# Patient Record
Sex: Male | Born: 1985 | Race: Black or African American | Hispanic: No | Marital: Single | Smoking: Former smoker
Health system: Southern US, Community
[De-identification: ages and names within clinical notes are randomized; demographics above are authoritative.]

## PROBLEM LIST (undated history)

## (undated) DIAGNOSIS — T7840XA Allergy, unspecified, initial encounter: Secondary | ICD-10-CM

## (undated) DIAGNOSIS — I10 Essential (primary) hypertension: Secondary | ICD-10-CM

## (undated) DIAGNOSIS — J45909 Unspecified asthma, uncomplicated: Secondary | ICD-10-CM

## (undated) HISTORY — DX: Essential (primary) hypertension: I10

## (undated) HISTORY — DX: Unspecified asthma, uncomplicated: J45.909

## (undated) HISTORY — DX: Allergy, unspecified, initial encounter: T78.40XA

---

## 2007-04-18 ENCOUNTER — Emergency Department (HOSPITAL_COMMUNITY): Admission: EM | Admit: 2007-04-18 | Discharge: 2007-04-18 | Payer: Self-pay | Admitting: Family Medicine

## 2007-05-18 ENCOUNTER — Emergency Department (HOSPITAL_COMMUNITY): Admission: EM | Admit: 2007-05-18 | Discharge: 2007-05-18 | Payer: Self-pay | Admitting: Family Medicine

## 2009-03-17 ENCOUNTER — Emergency Department (HOSPITAL_COMMUNITY): Admission: EM | Admit: 2009-03-17 | Discharge: 2009-03-17 | Payer: Self-pay | Admitting: Emergency Medicine

## 2009-04-20 ENCOUNTER — Emergency Department (HOSPITAL_COMMUNITY): Admission: EM | Admit: 2009-04-20 | Discharge: 2009-04-21 | Payer: Self-pay | Admitting: Emergency Medicine

## 2009-08-03 ENCOUNTER — Encounter (INDEPENDENT_AMBULATORY_CARE_PROVIDER_SITE_OTHER): Payer: Self-pay | Admitting: Family Medicine

## 2009-08-03 ENCOUNTER — Observation Stay (HOSPITAL_COMMUNITY): Admission: EM | Admit: 2009-08-03 | Discharge: 2009-08-04 | Payer: Self-pay | Admitting: Emergency Medicine

## 2009-09-14 ENCOUNTER — Encounter (INDEPENDENT_AMBULATORY_CARE_PROVIDER_SITE_OTHER): Payer: Self-pay | Admitting: Family Medicine

## 2009-09-21 ENCOUNTER — Ambulatory Visit: Payer: Self-pay | Admitting: Family Medicine

## 2009-09-21 DIAGNOSIS — J309 Allergic rhinitis, unspecified: Secondary | ICD-10-CM | POA: Insufficient documentation

## 2009-09-21 DIAGNOSIS — R519 Headache, unspecified: Secondary | ICD-10-CM | POA: Insufficient documentation

## 2009-09-21 DIAGNOSIS — J4530 Mild persistent asthma, uncomplicated: Secondary | ICD-10-CM | POA: Insufficient documentation

## 2009-09-21 DIAGNOSIS — R51 Headache: Secondary | ICD-10-CM | POA: Insufficient documentation

## 2009-09-21 LAB — CONVERTED CEMR LAB
ALT: 12 units/L (ref 0–53)
AST: 14 units/L (ref 0–37)
Basophils Absolute: 0 10*3/uL (ref 0.0–0.1)
Basophils Relative: 1 % (ref 0–1)
Creatinine, Ser: 0.86 mg/dL (ref 0.40–1.50)
Eosinophils Absolute: 0.2 10*3/uL (ref 0.0–0.7)
MCHC: 32.5 g/dL (ref 30.0–36.0)
MCV: 87.1 fL (ref 78.0–100.0)
Neutrophils Relative %: 49 % (ref 43–77)
Platelets: 224 10*3/uL (ref 150–400)
Total Bilirubin: 0.7 mg/dL (ref 0.3–1.2)
Vit D, 25-Hydroxy: 13 ng/mL — ABNORMAL LOW (ref 30–89)

## 2009-09-25 ENCOUNTER — Encounter (INDEPENDENT_AMBULATORY_CARE_PROVIDER_SITE_OTHER): Payer: Self-pay | Admitting: Family Medicine

## 2009-10-16 ENCOUNTER — Telehealth (INDEPENDENT_AMBULATORY_CARE_PROVIDER_SITE_OTHER): Payer: Self-pay | Admitting: Family Medicine

## 2009-10-17 ENCOUNTER — Encounter (INDEPENDENT_AMBULATORY_CARE_PROVIDER_SITE_OTHER): Payer: Self-pay | Admitting: Family Medicine

## 2009-11-21 ENCOUNTER — Encounter: Payer: Self-pay | Admitting: Physician Assistant

## 2009-11-21 ENCOUNTER — Ambulatory Visit: Payer: Self-pay | Admitting: Family Medicine

## 2009-11-21 DIAGNOSIS — E559 Vitamin D deficiency, unspecified: Secondary | ICD-10-CM | POA: Insufficient documentation

## 2009-11-21 LAB — CONVERTED CEMR LAB
HDL: 47 mg/dL (ref >39)
LDL Cholesterol: 81 mg/dL (ref 0–99)
Total CHOL/HDL Ratio: 3

## 2010-01-18 ENCOUNTER — Ambulatory Visit: Payer: Self-pay | Admitting: Physician Assistant

## 2010-01-18 DIAGNOSIS — L851 Acquired keratosis [keratoderma] palmaris et plantaris: Secondary | ICD-10-CM | POA: Insufficient documentation

## 2010-04-06 ENCOUNTER — Ambulatory Visit: Payer: Self-pay | Admitting: Physician Assistant

## 2010-04-12 ENCOUNTER — Encounter (INDEPENDENT_AMBULATORY_CARE_PROVIDER_SITE_OTHER): Payer: Self-pay | Admitting: *Deleted

## 2010-04-18 LAB — CONVERTED CEMR LAB: Vit D, 25-Hydroxy: 23 ng/mL — ABNORMAL LOW (ref 30–89)

## 2010-06-01 ENCOUNTER — Ambulatory Visit: Payer: Self-pay | Admitting: Physician Assistant

## 2010-06-01 DIAGNOSIS — B356 Tinea cruris: Secondary | ICD-10-CM | POA: Insufficient documentation

## 2010-06-05 ENCOUNTER — Telehealth: Payer: Self-pay | Admitting: Physician Assistant

## 2010-06-29 ENCOUNTER — Ambulatory Visit: Payer: Self-pay | Admitting: Physician Assistant

## 2010-07-01 ENCOUNTER — Telehealth: Payer: Self-pay | Admitting: Physician Assistant

## 2010-08-17 ENCOUNTER — Ambulatory Visit: Payer: Self-pay | Admitting: Physician Assistant

## 2010-09-13 ENCOUNTER — Telehealth (INDEPENDENT_AMBULATORY_CARE_PROVIDER_SITE_OTHER): Payer: Self-pay | Admitting: Nurse Practitioner

## 2010-11-16 ENCOUNTER — Encounter (INDEPENDENT_AMBULATORY_CARE_PROVIDER_SITE_OTHER): Payer: Self-pay | Admitting: Nurse Practitioner

## 2010-11-16 ENCOUNTER — Ambulatory Visit
Admission: RE | Admit: 2010-11-16 | Discharge: 2010-11-16 | Payer: Self-pay | Source: Home / Self Care | Attending: Physician Assistant | Admitting: Physician Assistant

## 2010-11-16 DIAGNOSIS — I10 Essential (primary) hypertension: Secondary | ICD-10-CM | POA: Insufficient documentation

## 2010-11-16 LAB — CONVERTED CEMR LAB
ALT: 15 units/L (ref 0–53)
AST: 15 units/L (ref 0–37)
Alkaline Phosphatase: 46 units/L (ref 39–117)
Basophils Absolute: 0 10*3/uL (ref 0.0–0.1)
Basophils Relative: 0 % (ref 0–1)
Chloride: 107 meq/L (ref 96–112)
Creatinine, Ser: 0.85 mg/dL (ref 0.40–1.50)
Eosinophils Absolute: 0.3 10*3/uL (ref 0.0–0.7)
Hemoglobin: 14 g/dL (ref 13.0–17.0)
LDL Cholesterol: 109 mg/dL — ABNORMAL HIGH (ref 0–99)
MCHC: 31.6 g/dL (ref 30.0–36.0)
Monocytes Absolute: 0.9 10*3/uL (ref 0.1–1.0)
Neutro Abs: 2.1 10*3/uL (ref 1.7–7.7)
RDW: 13.4 % (ref 11.5–15.5)
TSH: 0.732 microintl units/mL (ref 0.350–4.500)
Total Bilirubin: 0.5 mg/dL (ref 0.3–1.2)
Total CHOL/HDL Ratio: 4.2
VLDL: 14 mg/dL (ref 0–40)

## 2010-11-19 ENCOUNTER — Encounter (INDEPENDENT_AMBULATORY_CARE_PROVIDER_SITE_OTHER): Payer: Self-pay | Admitting: Nurse Practitioner

## 2010-12-05 NOTE — Assessment & Plan Note (Signed)
Summary: 2 MONTH FU ON ASTHMA/////KT   Vital Signs:  Patient profile:   25 year old male Height:      67 inches Weight:      236 pounds BMI:     37.10 Temp:     98.5 degrees F oral Pulse rate:   51 / minute Pulse rhythm:   regular Resp:     18 per minute BP sitting:   136 / 75  (left arm) Cuff size:   large  Vitals Entered By: Armenia Shannon (January 18, 2010 3:36 PM) CC: f/u on asthma...Marland KitchenMarland Kitchen pt is concerned about his dry skin patches..... Is Patient Diabetic? No Pain Assessment Patient in pain? no       Does patient need assistance? Functional Status Self care Ambulation Normal Comments pf 1.  360      2. 430            3. 410   Primary Care Provider:  Tereso Newcomer, PA-C  CC:  f/u on asthma...Marland KitchenMarland Kitchen pt is concerned about his dry skin patches......  History of Present Illness: Here for f/u. Breathing is good.  Not using ventolin as much.  Now using maybe 2 x a week.  Does use before exercising as precaution.  Has been running more lately.  No significant dyspnea. Has dry patches on his arms.  Not pruritic.  Showers daily.  No hives.    Asthma History    Asthma Control Assessment:    Age range: 12+ years    Symptoms: 0-2 days/week    Nighttime Awakenings: 0-2/month    Interferes w/ normal activity: no limitations    SABA use (not for EIB): 0-2 days/week    Asthma Control Assessment: Well Controlled   Current Medications (verified): 1)  Flovent Hfa 110 Mcg/act Aero (Fluticasone Propionate  Hfa) .... 3 Puffs Two Times A Day 2)  Prednisone 10 Mg Tabs (Prednisone) .... As Needed 3)  Ventolin Hfa 108 (90 Base) Mcg/act Aers (Albuterol Sulfate) .... 2 Puffs With Aerochamber Q 4-6 Hrs As Needed 4)  Ergocalciferol 50000 Unit Caps (Ergocalciferol) .... Take One By Mouth Per Week For 12 Weeks.  Allergies (verified): No Known Drug Allergies  Physical Exam  General:  alert, well-developed, well-nourished, and well-hydrated.   Head:  normocephalic and atraumatic.   Neck:   supple.   Lungs:  normal breath sounds, no crackles, and no wheezes.   Heart:  normal rate and regular rhythm.   Neurologic:  alert & oriented X3 and cranial nerves II-XII intact.   Skin:  dry, scaly patches noted scattered about upper arms and torso   Impression & Recommendations:  Problem # 1:  ASTHMA (ICD-493.90) controlled continue current meds refill ventolin f/u in 4 mos  His updated medication list for this problem includes:    Flovent Hfa 110 Mcg/act Aero (Fluticasone propionate  hfa) .Marland KitchenMarland KitchenMarland KitchenMarland Kitchen 3 puffs two times a day    Prednisone 10 Mg Tabs (Prednisone) .Marland Kitchen... As needed    Ventolin Hfa 108 (90 Base) Mcg/act Aers (Albuterol sulfate) .Marland Kitchen... 2 puffs with aerochamber q 4-6 hrs as needed  Problem # 2:  VITAMIN D DEFICIENCY (ICD-268.9) check labs in April  Problem # 3:  DRY SKIN (ICD-701.1) cut back on soap use moisturizer f/u as needed  Complete Medication List: 1)  Flovent Hfa 110 Mcg/act Aero (Fluticasone propionate  hfa) .... 3 puffs two times a day 2)  Prednisone 10 Mg Tabs (Prednisone) .... As needed 3)  Ventolin Hfa 108 (90 Base) Mcg/act Aers (  Albuterol sulfate) .... 2 puffs with aerochamber q 4-6 hrs as needed 4)  Ergocalciferol 50000 Unit Caps (Ergocalciferol) .... Take one by mouth per week for 12 weeks.  Patient Instructions: 1)  Please schedule a follow-up appointment in 4 months with Domani Bakos for Asthma.  2)  Try to use soap on entire body just once a week. 3)  Use Eucerin or Aveeno after you bathe to help moisturize your skin. Prescriptions: VENTOLIN HFA 108 (90 BASE) MCG/ACT AERS (ALBUTEROL SULFATE) 2 puffs with aerochamber q 4-6 hrs as needed  #1 x 11   Entered and Authorized by:   Tereso Newcomer PA-C   Signed by:   Tereso Newcomer PA-C on 01/18/2010   Method used:   Faxed to ...       [facility] (retail)       [address] [place], [place]  [address]       Ph: [phone]       Fax: [phone]   RxID:    [id number]   Prevention & Chronic Care Immunizations   Influenza vaccine: Not documented    Tetanus booster: Not documented    Pneumococcal vaccine: Not documented  Other Screening   Smoking status: never  (09/21/2009)

## 2010-12-05 NOTE — Letter (Signed)
Summary: PT INFORMATION SHEET  PT INFORMATION SHEET   Imported By: Arta Bruce 11/06/2009 12:16:58  _____________________________________________________________________  External Attachment:    Type:   Image     Comment:   External Document

## 2010-12-05 NOTE — Assessment & Plan Note (Signed)
Summary: Asthma   Vital Signs:  Patient profile:   25 year old male Height:      67 inches Weight:      248 pounds BMI:     38.98 O2 Sat:      97 % on Room air Temp:     98.3 degrees F oral Pulse rate:   66 / minute Pulse rhythm:   regular Resp:     20 per minute BP sitting:   110 / 73  (left arm) Cuff size:   regular  Vitals Entered By: Hassell Halim CMA (August 17, 2010 9:33 AM)  O2 Flow:  Room air CC: F/U... Is Patient Diabetic? No Pain Assessment Patient in pain? no       Does patient need assistance? Functional Status Self care Ambulation Normal Comments PF  1.  450    2.   340     3.   440   Primary Care Provider:  Tereso Newcomer, PA-C  CC:  F/U....  History of Present Illness: Here for f/u on Asthma.  Doing better.  No side effects to Advair.   Asthma History    Asthma Control Assessment:    Age range: 12+ years    Symptoms: 0-2 days/week    Nighttime Awakenings: 0-2/month    Interferes w/ normal activity: no limitations    SABA use (not for EIB): 0-2 days/week    Asthma Control Assessment: Well Controlled   Current Medications (verified): 1)  Advair Diskus 250-50 Mcg/dose Aepb (Fluticasone-Salmeterol) .Marland Kitchen.. 1 Inhalation Two Times A Day 2)  Prednisone 10 Mg Tabs (Prednisone) .... As Needed 3)  Ventolin Hfa 108 (90 Base) Mcg/act Aers (Albuterol Sulfate) .... 2 Puffs With Aerochamber Q 4-6 Hrs As Needed 4)  Vitamin D 400 Unit Tabs (Cholecalciferol) .... Take 2 By Mouth Once Daily 5)  Lamisil At 1 % Crea (Terbinafine Hcl) .... Apply To Affected Area Two Times A Day Until Clear  Allergies (verified): No Known Drug Allergies  Physical Exam  General:  alert, well-developed, and well-nourished.   Head:  normocephalic and atraumatic.   Eyes:  pupils equal, pupils round, and pupils reactive to light.   Ears:  R ear normal and L ear normal.   Mouth:  pharynx pink and moist and no exudates.   Neck:  supple.   Lungs:  Good breath sounds bilat rare left  basilar wheeze, clears o/w no wheezing  no rales  Heart:  normal rate and regular rhythm.   Neurologic:  alert & oriented X3.   Psych:  normally interactive.     Impression & Recommendations:  Problem # 1:  ASTHMA (ICD-493.90) Assessment Improved continue current mgmt will give claritin to fill if symptoms of allergies start (rarely has)  His updated medication list for this problem includes:    Advair Diskus 250-50 Mcg/dose Aepb (Fluticasone-salmeterol) .Marland Kitchen... 1 inhalation two times a day    Prednisone 10 Mg Tabs (Prednisone) .Marland Kitchen... As needed    Ventolin Hfa 108 (90 Base) Mcg/act Aers (Albuterol sulfate) .Marland Kitchen... 2 puffs with aerochamber q 4-6 hrs as needed  Complete Medication List: 1)  Advair Diskus 250-50 Mcg/dose Aepb (Fluticasone-salmeterol) .Marland Kitchen.. 1 inhalation two times a day 2)  Prednisone 10 Mg Tabs (Prednisone) .... As needed 3)  Ventolin Hfa 108 (90 Base) Mcg/act Aers (Albuterol sulfate) .... 2 puffs with aerochamber q 4-6 hrs as needed 4)  Vitamin D 400 Unit Tabs (Cholecalciferol) .... Take 2 by mouth once daily 5)  Lamisil At 1 %  Crea (Terbinafine hcl) .... Apply to affected area two times a day until clear 6)  Loratadine 10 Mg Tabs (Loratadine) .... Take 1 tablet by mouth once a day as needed for allergy symptoms  Other Orders: Flu Vaccine 28yrs + ([address]) Admin 1st Vaccine (62130)  Patient Instructions: 1)  Please schedule a follow-up appointment in 3 months for asthma.  Prescriptions: LORATADINE 10 MG TABS (LORATADINE) Take 1 tablet by mouth once a day as needed for allergy symptoms  #30 x 3   Entered and Authorized by:   Tereso Newcomer PA-C   Signed by:   Tereso Newcomer PA-C on 08/17/2010   Method used:   Print then Give to Patient   RxID:   [id number]    Immunizations Administered:  Influenza Vaccine # 1:    Vaccine Type: Fluvax 3+    Site: right deltoid    Mfr: GlaxoSmithKline    Dose: 0.5 ml    Route: IM    Given by: Dutch Quint RN    Exp. Date:  05/04/2011    Lot #: [id number]    VIS given: 05/29/10 version given August 17, 2010.  Flu Vaccine Consent Questions:    Do you have a history of severe allergic reactions to this vaccine? no    Any prior history of allergic reactions to egg and/or gelatin? no    Do you have a sensitivity to the preservative Thimersol? no    Do you have a past history of Guillan-Barre Syndrome? no    Do you currently have an acute febrile illness? no    Have you ever had a severe reaction to latex? no    Vaccine information given and explained to patient? yes

## 2010-12-05 NOTE — Progress Notes (Signed)
Summary: RASH ON RIGHT FOOT  Phone Note Call from Patient Call back at [phone]- [phone]   Reason for Call: Talk to Nurse Summary of Call: [name] SAYS HE HAS PATCH OF DRY SKIN ON HIS RIGHT FOOT,BUT HE'S NOT SURE ITS DRY SKIN, BUT IT HURTS AND HE THINKS ITS CAUSING HIS FOOT TO GO TO SLEEP. YOU CAN CALL HIM @ WORK Initial call taken by: Luke Gross,  September 13, 2010 8:40 AM  Follow-up for Phone Call        Has had for awhile, just forgot to mention it at last visit.  Is still causing him pain.  Triage visit for 09/18/10.  Luke Quint RN  September 17, 2010 4:31 PM  No show for appointment.  Luke Quint RN  September 18, 2010 2:51 PM

## 2010-12-05 NOTE — Letter (Signed)
Summary: Discharge Summary  Discharge Summary   Imported By: Arta Bruce 11/07/2009 14:15:37  _____________________________________________________________________  External Attachment:    Type:   Image     Comment:   External Document

## 2010-12-05 NOTE — Letter (Signed)
Summary: Generic Letter  [facility]  [address] [place], [place] [address]   Phone: [phone]  Fax: [phone]    04/12/2010  BREWER HITCHMAN [address] [place], [place]  [address]  Dear Mr. Gombert,  We have been unable to contact you by telephone.  Please call our office, at your convenience, so that we may speak with you.   Sincerely,   Dutch Quint RN

## 2010-12-05 NOTE — Assessment & Plan Note (Signed)
Summary: FU APP IN 3-4 WKS WITH SCOTT TO CHECK BREATHING//GK   Vital Signs:  Patient profile:   25 year old male Height:      67 inches Weight:      235 pounds BMI:     36.94 O2 Sat:      98 % on Room air Temp:     97.2 degrees F oral Pulse rate:   56 / minute Pulse rhythm:   regular Resp:     18 per minute BP sitting:   129 / 74  (left arm) Cuff size:   large  Vitals Entered By: Armenia Shannon (June 29, 2010 8:53 AM)  O2 Flow:  Room air CC: f/u.. Is Patient Diabetic? No Pain Assessment Patient in pain? no       Does patient need assistance? Functional Status Self care Ambulation Normal Comments pf  1.   390    2.   460     3.   470   Primary Care Provider:  Tereso Newcomer, PA-C  CC:  f/u...  History of Present Illness: Here for f/u on asthma.  Notes symptoms about the same. See below.  Patient uses albuterol prophylactically throughout the day.  He has gotten used to doing this over the years.  Does not think he uses albuterol that much during the day b/c he needs it.  Asthma History    Asthma Control Assessment:    Age range: 12+ years    Symptoms: >2 days/week    Nighttime Awakenings: 0-2/month    Interferes w/ normal activity: no limitations    SABA use (not for EIB): >2 days/week    Asthma Control Assessment: Not Well Controlled   Current Medications (verified): 1)  Flovent Hfa 110 Mcg/act Aero (Fluticasone Propionate  Hfa) .... 3 Puffs Two Times A Day 2)  Prednisone 10 Mg Tabs (Prednisone) .... As Needed 3)  Ventolin Hfa 108 (90 Base) Mcg/act Aers (Albuterol Sulfate) .... 2 Puffs With Aerochamber Q 4-6 Hrs As Needed 4)  Ergocalciferol 50000 Unit Caps (Ergocalciferol) .... Take One By Mouth Per Week For 8 Weeks. 5)  Lamisil At 1 % Crea (Terbinafine Hcl) .... Apply To Affected Area Two Times A Day Until Clear  Allergies (verified): No Known Drug Allergies  Physical Exam  General:  alert, well-developed, and well-nourished.   Head:  normocephalic and  atraumatic.   Neck:  supple and no cervical lymphadenopathy.   Lungs:  improved air movement from last exam faint exp wheezes at apices Heart:  RRR no murmur  Extremities:  no edema  Neurologic:  alert & oriented X3 and cranial nerves II-XII intact.   Psych:  normally interactive.     Impression & Recommendations:  Problem # 1:  ASTHMA (ICD-493.90) Assessment Improved control still not where I think it should be with him using albuterol throughout the day without waiting for symptoms, I'm not sure if he is having a lot of bronchospasm throughout the day or not change flovent to Advair f/u with me in 6 weeks samples x 2 given  His updated medication list for this problem includes:    Advair Diskus 250-50 Mcg/dose Aepb (Fluticasone-salmeterol) .Marland Kitchen... 1 inhalation two times a day    Prednisone 10 Mg Tabs (Prednisone) .Marland Kitchen... As needed    Ventolin Hfa 108 (90 Base) Mcg/act Aers (Albuterol sulfate) .Marland Kitchen... 2 puffs with aerochamber q 4-6 hrs as needed  Problem # 2:  VITAMIN D DEFICIENCY (ICD-268.9)  Orders: T-Vitamin D (25-Hydroxy) (81191-47829)  Complete  Medication List: 1)  Advair Diskus 250-50 Mcg/dose Aepb (Fluticasone-salmeterol) .Marland Kitchen.. 1 inhalation two times a day 2)  Prednisone 10 Mg Tabs (Prednisone) .... As needed 3)  Ventolin Hfa 108 (90 Base) Mcg/act Aers (Albuterol sulfate) .... 2 puffs with aerochamber q 4-6 hrs as needed 4)  Ergocalciferol 50000 Unit Caps (Ergocalciferol) .... Take one by mouth per week for 8 weeks. 5)  Lamisil At 1 % Crea (Terbinafine hcl) .... Apply to affected area two times a day until clear  Asthma Management Plan    Asthma Severity: Moderate Persistent    Control Assessment: Not Well Controlled    Personal best PEF: 580 liters/minute    Predicted PEF: 602 liters/minute    Working PEF: 580 liters/minute    Plan based on PEF formula: [place] [employer] Zone: (Range: 460 to 580) FLOVENT HFA 110 MCG/ACT AERO 3 puffs two times a day  VENTOLIN  HFA 108 (90 BASE) MCG/ACT AERS:  2 puffs every 4 hours as needed Use Ventolin prior to going outside for any length of time when [employer] or [employer] is in effect.  Yellow Zone: VENTOLIN HFA 108 (90 BASE) MCG/ACT AERS:  2 puffs every 3-4 hours (yellow zone) FLOVENT HFA 110 MCG/ACT AERO:  4 puffs every 12 hours   Red Zone: FLOVENT HFA 110 MCG/ACT AERO 4 puffs two times a day  Start Yellow Zone Meds first sign of cold/URI symptoms.   Urgent Care or ED if no response   Patient Instructions: 1)  Stop Flovent. 2)  Start Advair and use one inhalation two times a day. 3)  Your samples only last one week each. 4)  A prescription has been sent to [facility].  You should be able to pick up next week. 5)  Schedule follow up with Scott for asthma in 6 weeks.  Return sooner if breathing is worse. Prescriptions: ADVAIR DISKUS 250-50 MCG/DOSE AEPB (FLUTICASONE-SALMETEROL) 1 inhalation two times a day  #1 x 5   Entered and Authorized by:   Tereso Newcomer PA-C   Signed by:   Tereso Newcomer PA-C on 06/29/2010   Method used:   Faxed to ...       [facility] - [facility] (retail)       [address] [place], [place]  [address]       Ph: [phone]       Fax: [phone]   RxID:   [id number]

## 2010-12-05 NOTE — Progress Notes (Signed)
Summary: aurthorization request  Phone Note Call from Patient   Summary of Call: pt needs the provider to call in to [facility] pharmacy for the pt to get refills from predisidone so he can pick up with the rest of medication on Friday.  Alben Spittle PA-c Initial call taken by: Manon Hilding,  June 05, 2010 12:34 PM  Follow-up for Phone Call        forward to provider Follow-up by: Armenia Shannon,  June 05, 2010 4:44 PM  Additional Follow-up for Phone Call Additional follow up Details #1::        I understand he had some of the prednisone left over from a previous provider. But, this is not a medicine he needs to have refilled. It is intended to be used when someone's asthma is out of control. This would need to be decided in an office visit or a visit to the ED by a provider.   Additional Follow-up by: Tereso Newcomer PA-C,  June 05, 2010 5:19 PM    Additional Follow-up for Phone Call Additional follow up Details #2::    Had trouble getting a part for his breathing machine -- states that is being resolved.  Explained re prednisone refill; verbalized understanding, but states that he seems to be better with his asthma when he takes the prednisone; that's why he wanted a refill.  Has appt. for 8/26 with provider -- advised to keep appt., but to call office if symptoms worsen. Follow-up by: Dutch Quint RN,  June 06, 2010 12:09 PM  Additional Follow-up for Phone Call Additional follow up Details #3:: Details for Additional Follow-up Action Taken: I was under the impression he was not taking the prednisone.  Prednisone is only used as needed for exacerbations of asthma.  It is not meant to be used on a daily basis.  This can be dangerous.  If his asthma symptoms are worsening, he needs an acute appt. here or go to urgent care. Tereso Newcomer PA-C  June 06, 2010 1:05 PM  Left message on answering machine for pt. to return call.  Dutch Quint RN  June 07, 2010 10:17 AM  States that he  hasn't been using it daily, only once in awhile for flare-ups.  Advised of provider's recommendations -- verbalized understanding.   Additional Follow-up by: Dutch Quint RN,  June 08, 2010 9:56 AM

## 2010-12-05 NOTE — Assessment & Plan Note (Signed)
Summary: FOLLOW UP APP IN 2 MONTHS/FASTING LIPIDS//GK   Vital Signs:  Patient profile:   25 year old male Height:      67 inches Weight:      239 pounds BMI:     37.57 O2 Sat:      98 % on zxRoom air Temp:     98.1 degrees F oral Pulse rate:   63 / minute Pulse rhythm:   regular Resp:     18 per minute BP sitting:   136 / 73  (left arm) Cuff size:   large  Vitals Entered By: Armenia Shannon (November 21, 2009 9:33 AM)  O2 Flow:  zxRoom air CC: f/u on asthma...km Is Patient Diabetic? No Pain Assessment Patient in pain? no       Does patient need assistance? Functional Status Self care Ambulation Normal Comments pf  1.   430     2.   450    3.    420   Primary Care Provider:  Tereso Newcomer, PA-C  CC:  f/u on asthma...km.  History of Present Illness: Here for f/u on Asthma. Asthma symptoms usually include dyspnea and wheezing. Feeling much better and using flovent two times a day and ventolin as needed. See astma assessment below. No fevers.  NO URI symptoms.  No cough. No problems with mouth.  Rinsing after using flovent. Vitamin d level low.  Not started on med. yet.  Asthma History    Asthma Control Assessment:    Age range: 12+ years    Symptoms: 0-2 days/week    Nighttime Awakenings: 0-2/month    Interferes w/ normal activity: no limitations    SABA use (not for EIB): >2 days/week    Asthma Control Assessment: Not Well Controlled   Allergies: No Known Drug Allergies  Physical Exam  General:  alert, well-developed, and well-nourished.   Head:  normocephalic and atraumatic.   Lungs:  normal breath sounds, no crackles, and no wheezes.   Heart:  normal rate and regular rhythm.   Neurologic:  alert & oriented X3 and cranial nerves II-XII intact.   Psych:  normally interactive.     Impression & Recommendations:  Problem # 1:  VITAMIN D DEFICIENCY (ICD-268.9) start replacement recheck in 3 mos  Problem # 2:  ASTHMA (ICD-493.90) increase flovent to 3  puffs two times a day  f/u in 2 mos.  His updated medication list for this problem includes:    Flovent Hfa 110 Mcg/act Aero (Fluticasone propionate  hfa) .Marland KitchenMarland KitchenMarland KitchenMarland Kitchen 3 puffs two times a day    Prednisone 10 Mg Tabs (Prednisone) .Marland Kitchen... As needed    Ventolin Hfa 108 (90 Base) Mcg/act Aers (Albuterol sulfate) .Marland Kitchen... 2 puffs with aerochamber q 4-6 hrs as needed  Complete Medication List: 1)  Flovent Hfa 110 Mcg/act Aero (Fluticasone propionate  hfa) .... 3 puffs two times a day 2)  Prednisone 10 Mg Tabs (Prednisone) .... As needed 3)  Ventolin Hfa 108 (90 Base) Mcg/act Aers (Albuterol sulfate) .... 2 puffs with aerochamber q 4-6 hrs as needed 4)  Ergocalciferol 50000 Unit Caps (Ergocalciferol) .... Take one by mouth per week for 12 weeks.  Asthma Management Plan    Asthma Severity: Moderate Persistent    Control Assessment: Not Well Controlled    Personal best PEF: 580 liters/minute    Predicted PEF: 602 liters/minute    Working PEF: 580 liters/minute    Plan based on PEF formula: [place] and [employer] Zone: (Range: 460 to  580) FLOVENT HFA 110 MCG/ACT AERO  Yellow Zone: VENTOLIN HFA 108 (90 BASE) MCG/ACT AERS:  2 puffs every 3-4 hours (yellow zone) FLOVENT HFA 110 MCG/ACT AERO:  4 puffs every 12 hours  Red Zone: FLOVENT HFA 110 MCG/ACT AERO Start Yellow Zone Meds first sign of cold/URI symptoms.   Urgent Care or ED if no response   Patient Instructions: 1)  Return in 3 months for labs:  Vitamin D level ( ICD 268.9). 2)  Increase flovent to 3 puffs two times a day. 3)  Please schedule a follow-up appointment in 2 months with Scott for asthma. 4)    Prescriptions: ERGOCALCIFEROL 50000 UNIT CAPS (ERGOCALCIFEROL) Take one by mouth per week for 12 weeks.  #12 x 0   Entered and Authorized by:   Tereso Newcomer PA-C   Signed by:   Tereso Newcomer PA-C on 11/21/2009   Method used:   Print then Give to Patient   RxID:   [id number]

## 2010-12-05 NOTE — Letter (Signed)
Summary: SLIDING SCALE FEE  SLIDING SCALE FEE   Imported By: Arta Bruce 11/20/2009 15:47:49  _____________________________________________________________________  External Attachment:    Type:   Image     Comment:   External Document

## 2010-12-05 NOTE — Letter (Signed)
Summary: APPLICATION FOR EMPLOYMENT @ POST OFFICE  APPLICATION FOR EMPLOYMENT @ POST OFFICE   Imported By: Arta Bruce 12/14/2009 11:30:59  _____________________________________________________________________  External Attachment:    Type:   Image     Comment:   External Document

## 2010-12-05 NOTE — Progress Notes (Signed)
Summary: Vitamin D  Phone Note Outgoing Call   Summary of Call: Vit D looks better. He should be done with Ergocalciferol 50000 units once a week. He can start taking Vit D 400 units take 2 daily  Rx sent to [facility] pharm  Initial call taken by: Brynda Rim,  July 01, 2010 9:14 PM  Follow-up for Phone Call        Left message on answering machine for pt. to return call.  Dutch Quint RN  July 02, 2010 3:46 PM  Left message on answering machine for pt. to return call.  Dutch Quint RN  July 03, 2010 9:35 AM   Additional Follow-up for Phone Call Additional follow up Details #1::        Pt aware........Marland Kitchen Additional Follow-up by: Vesta Mixer CMA,  July 03, 2010 11:23 AM    New/Updated Medications: VITAMIN D 400 UNIT TABS (CHOLECALCIFEROL) Take 2 by mouth once daily Prescriptions: VITAMIN D 400 UNIT TABS (CHOLECALCIFEROL) Take 2 by mouth once daily  #60 x 11   Entered and Authorized by:   Tereso Newcomer PA-C   Signed by:   Tereso Newcomer PA-C on 07/01/2010   Method used:   Faxed to ...       [facility] - [facility] (retail)       [address] [place], [place]  [address]       Ph: [phone]       Fax: [phone]   RxID:   [id number]

## 2010-12-05 NOTE — Assessment & Plan Note (Signed)
Summary: FU ON ASTHMA////KT   Vital Signs:  Patient profile:   25 year old male Weight:      240 pounds O2 Sat:      97 % on Room air Temp:     97.8 degrees F oral Pulse rate:   76 / minute Pulse rhythm:   regular Resp:     18 per minute BP sitting:   122 / 78  (right arm) Cuff size:   large  Vitals Entered By: Armenia Shannon (June 01, 2010 9:01 AM)  O2 Flow:  Room air CC: follow-up visit, asthma Is Patient Diabetic? No  Does patient need assistance? Functional Status Self care Ambulation Normal Comments PF  1.   450     2. 410    3.   460   Primary Care Provider:  Tereso Newcomer, PA-C  CC:  follow-up visit and asthma.  History of Present Illness: Here for f/u. Feels like breathing doing ok.  Does work at camp outside all day. Has to use Ventolin after going outside some.  Not taking Flovent 3 puffs two times a day.  Only taking 2 puffs two times a day. Has some rash that is itchy at times on his scrotum.  Has had "jock itch" in the past.  Not using anything. Taking Vitamin D q week.  Due for recheck next month.  Asthma History    Asthma Control Assessment:    Age range: 12+ years    Symptoms: >2 days/week    Nighttime Awakenings: 0-2/month    Interferes w/ normal activity: no limitations    SABA use (not for EIB): >2 days/week    Exacerbations requiring oral systemic steroids: 0-1/year    Asthma Control Assessment: Not Well Controlled   Current Medications (verified): 1)  Flovent Hfa 110 Mcg/act Aero (Fluticasone Propionate  Hfa) .... 3 Puffs Two Times A Day 2)  Prednisone 10 Mg Tabs (Prednisone) .... As Needed 3)  Ventolin Hfa 108 (90 Base) Mcg/act Aers (Albuterol Sulfate) .... 2 Puffs With Aerochamber Q 4-6 Hrs As Needed 4)  Ergocalciferol 50000 Unit Caps (Ergocalciferol) .... Take One By Mouth Per Week For 8 Weeks.  Allergies (verified): No Known Drug Allergies  Social History: camp counselor for the summer at [employer] usually works at [employer] here with girlfriend today  Physical Exam  General:  alert, well-developed, well-nourished, and well-hydrated.   Head:  normocephalic and atraumatic.   Neck:  supple.   Lungs:  decreased breath sounds prolonged exp phase with exp wheezes no rales  Heart:  normal rate and regular rhythm.   Genitalia:  scaling of scrotum noted very minimal scaling of groin area  Extremities:  no edema  Neurologic:  alert & oriented X3 and cranial nerves II-XII intact.   Psych:  normally interactive.     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 As needed    Ventolin  Hfa 108 (90 Base) Mcg/act Aers (Albuterol sulfate) .Marland Kitchen... 2 puffs with aerochamber q 4-6 hrs as needed  Problem # 2:  TINEA CRURIS (ICD-110.3) lamisil cream  Problem # 3:  VITAMIN D DEFICIENCY (ICD-268.9) check level at next appt  Problem # 4:  PREVENTIVE HEALTH CARE (ICD-V70.0) Td update today  Complete Medication List: 1)  Flovent Hfa 110 Mcg/act Aero (Fluticasone propionate  hfa) .... 3 puffs two times a day 2)  Prednisone 10 Mg Tabs (Prednisone) .... As needed 3)  Ventolin Hfa 108 (90 Base) Mcg/act Aers (Albuterol sulfate) .... 2 puffs with aerochamber q 4-6 hrs as needed 4)  Ergocalciferol 50000 Unit Caps (Ergocalciferol) .... Take one by mouth per week for 8 weeks. 5)  Lamisil At 1 % Crea (Terbinafine hcl) .... Apply to affected area two times a day until clear  Other Orders: Tdap => 104yrs IM  (04540) Admin 1st Vaccine (98119) Admin 1st Vaccine (State) [id number])  Asthma Management Plan    Asthma Severity: Moderate Persistent    Control Assessment: Not Well Controlled    Personal best PEF: 580 liters/minute    Predicted PEF: 602 liters/minute    Working PEF: 580 liters/minute    Plan based on PEF formula: [place] and [employer] Zone: (Range: 460 to 580) FLOVENT HFA 110 MCG/ACT AERO 3 puffs two times a day  VENTOLIN HFA 108 (90 BASE) MCG/ACT AERS:  2 puffs every 4 hours as needed Use Ventolin prior to going outside for any length of time when [employer] or [employer] is in effect.  Yellow Zone: VENTOLIN HFA 108 (90 BASE) MCG/ACT AERS:  2 puffs every 3-4 hours (yellow zone) FLOVENT HFA 110 MCG/ACT AERO:  4 puffs every 12 hours   Red Zone: FLOVENT HFA 110 MCG/ACT AERO 4 puffs two times a day  Start Yellow Zone Meds first sign of cold/URI symptoms.   Urgent Care or ED if no response   Patient Instructions: 1)  Use the Lamisil on area two times a day until clear. 2)  If you use it for 2 weeks without any change or clearing, stop and follow up with me. 3)  Increase Flovent to 3 puffs two times a day. 4)  Use Ventolin before going outside when air quality is bad. 5)  Please schedule a follow-up appointment in 3-4 weeks with Kivon Aprea to check breathing.  Return sooner if breathing worse. Prescriptions: LAMISIL AT 1 % CREA (TERBINAFINE HCL) apply to affected area two times a day until clear  #30 grams x 1   Entered and Authorized by:   Tereso Newcomer PA-C   Signed by:   Tereso Newcomer PA-C on 06/01/2010   Method used:   Print then Give to Patient   RxID:   [id number]    Tetanus/Td Vaccine    Vaccine Type: Tdap    Site: right deltoid    Mfr: Sanofi Pasteur    Dose: 0.5 ml    Route: IM    Given by: Armenia Shannon    Exp. Date: 10/17/2012    Lot #: [id number]    VIS given: 09/22/07 version given June 01, 2010.

## 2010-12-06 NOTE — Letter (Signed)
Summary: Handout Printed  Printed Handout:  - Asthma Action / Management Plan 

## 2010-12-06 NOTE — Letter (Signed)
Summary: Handout Printed  Printed Handout:  - *Immunization Record-Adult 2009

## 2010-12-06 NOTE — Letter (Signed)
Summary: Lipid Letter  [facility]  [address] [place], [place] [address]   Phone: [phone]  Fax: [phone]    11/19/2010  Jackson Coffield [address] [place], [place]  [address]  Dear Cristal Deer:  We have carefully reviewed your last lipid profile from 11/16/2010 and the results are noted below with a summary of recommendations for lipid management.    Cholesterol:       161     Goal: less than 200   HDL "good" Cholesterol:   38     Goal: less than 40   LDL "bad" Cholesterol:   109     Goal: less than 130   Triglycerides:       68     Goal: less than 150    Labs done during recent office are normal.      Current Medications: 1)    Advair Diskus 250-50 Mcg/dose Aepb (Fluticasone-salmeterol) .Marland Kitchen.. 1 inhalation two times a day 2)    Ventolin Hfa 108 (90 Base) Mcg/act Aers (Albuterol sulfate) .... 2 puffs with aerochamber q 4-6 hrs as needed 3)    Vitamin D 400 Unit Tabs (Cholecalciferol) .... Take 2 by mouth once daily 4)    Loratadine 10 Mg Tabs (Loratadine) .... Take 1 tablet by mouth once a day as needed for allergy symptoms 5)    Lotrisone 1-0.05 % Crea (Clotrimazole-betamethasone) .... One application topically to affected area two times a day until healed 6)    Albuterol Sulfate (2.5 Mg/11ml) 0.083% Nebu (Albuterol sulfate) .... Use one vial every 6 hours as needed for shortness of breath  If you have any questions, please call. We appreciate being able to work with you.   Sincerely,    Lehman Prom, FNP [facility]

## 2010-12-06 NOTE — Assessment & Plan Note (Signed)
Summary: Asthma   Vital Signs:  Patient profile:   25 year old male Weight:      242.2 pounds BMI:     38.07 O2 Sat:      96 % Temp:     97.9 degrees F oral Pulse rate:   66 / minute Pulse rhythm:   regular Resp:     20 per minute BP sitting:   140 / 80  (left arm) Cuff size:   regular  Vitals Entered By: Levon Hedger (November 16, 2010 8:36 AM)  Nutrition Counseling: Patient's BMI is greater than 25 and therefore counseled on weight management options.  Serial Vital Signs/Assessments:  Comments: 8:43 AM P/F  450,  500,  500 By: Levon Hedger   Is Patient Diabetic? No Pain Assessment Patient in pain? no       Does patient need assistance? Functional Status Self care Ambulation Normal   Primary Care Provider:  Tereso Newcomer, PA-C   History of Present Illness:  Pt into the office to f/u on asthma. No acute problems today.    Asthma History    Asthma Control Assessment:    Age range: 12+ years    Symptoms: 0-2 days/week    Nighttime Awakenings: 0-2/month    Interferes w/ normal activity: no limitations    SABA use (not for EIB): 0-2 days/week    ATAQ questionnaire: 0    Exacerbations requiring oral systemic steroids: 0-1/year    Asthma Control Assessment: Well Controlled    Habits & Providers  Alcohol-Tobacco-Diet     Alcohol drinks/day: 0     Tobacco Status: never     Passive Smoke Exposure: yes  Exercise-Depression-Behavior     Does Patient Exercise: no     STD Risk: risk noted     Drug Use: former, marijuana     Seat Belt Use: yes  Allergies (verified): No Known Drug Allergies  Social History: Does Patient Exercise:  no  Review of Systems General:  Denies fever. CV:  Denies chest pain or discomfort. Resp:  Denies cough. GI:  Denies abdominal pain, nausea, and vomiting. Derm:  Complains of dryness; right foot - dry itchy skin intermittent.  He has been applying lotion to the affected area but it does not completely  resolve..  Physical Exam  General:  alert.   Head:  normocephalic.   Lungs:  left upper - slight expiratory wheezes  right lung - no wheezes Heart:  normal rate.   Abdomen:  normal bowel sounds.   Msk:  normal ROM.   Neurologic:  alert & oriented X3.   Skin:  right foot - discoloration on plantar aspect of the foot circular discoloration Psych:  Oriented X3.     Impression & Recommendations:  Problem # 1:  ASTHMA (ICD-493.90) Reviewed action plan with pt His updated medication list for this problem includes:    Advair Diskus 250-50 Mcg/dose Aepb (Fluticasone-salmeterol) .Marland Kitchen... 1 inhalation two times a day    Ventolin Hfa 108 (90 Base) Mcg/act Aers (Albuterol sulfate) .Marland Kitchen... 2 puffs with aerochamber q 4-6 hrs as needed    Albuterol Sulfate (2.5 Mg/56ml) 0.083% Nebu (Albuterol sulfate) ..... Use one vial every 6 hours as needed for shortness of breath  Orders: Peak Flow Rate (94150) Pulse Oximetry (single measurment) (62831)  Problem # 2:  ALLERGIC RHINITIS (ICD-477.9) Pt did not take meds Rx given as needed  His updated medication list for this problem includes:    Loratadine 10 Mg Tabs (Loratadine) .Marland Kitchen... Take 1  tablet by mouth once a day as needed for allergy symptoms  Orders: T-Lipid Profile (53664-40347) T-Comprehensive Metabolic Panel [phone]) T-CBC w/Diff (64332-95188) Rapid HIV  (41660) T-Syphilis Test (RPR) (63016-01093) T-TSH (23557-32202)  Problem # 3:  ELEVATED BLOOD PRESSURE WITHOUT DIAGNOSIS OF HYPERTENSION (ICD-796.2) no current meds DASH diet BP normal during last visit  Problem # 4:  TINEA CRURIS (ICD-110.3) reviewed dx with pt advised him to keep feet clean and dry  Complete Medication List: 1)  Advair Diskus 250-50 Mcg/dose Aepb (Fluticasone-salmeterol) .Marland Kitchen.. 1 inhalation two times a day 2)  Ventolin Hfa 108 (90 Base) Mcg/act Aers (Albuterol sulfate) .... 2 puffs with aerochamber q 4-6 hrs as needed 3)  Vitamin D 400 Unit Tabs (Cholecalciferol)  .... Take 2 by mouth once daily 4)  Loratadine 10 Mg Tabs (Loratadine) .... Take 1 tablet by mouth once a day as needed for allergy symptoms 5)  Lotrisone 1-0.05 % Crea (Clotrimazole-betamethasone) .... One application topically to affected area two times a day until healed 6)  Albuterol Sulfate (2.5 Mg/58ml) 0.083% Nebu (Albuterol sulfate) .... Use one vial every 6 hours as needed for shortness of breath  Asthma Management Plan    Asthma Severity: Moderate Persistent    Control Assessment: Well Controlled    Personal best PEF: 580 liters/minute    Predicted PEF: 602 liters/minute    Working PEF: 580 liters/minute    Plan based on PEF formula: [employer] and [employer] Zone: (Range: 460 to 580) ADVAIR DISKUS 250-50 MCG/DOSE AEPB:  2 puffs every 12 hours   Yellow Zone: VENTOLIN HFA 108 (90 BASE) MCG/ACT AERS:  2 puffs every 3-4 hours (yellow zone)    Red Zone: Start Yellow Zone Meds first sign of cold/URI symptoms.   Urgent Care or ED if no response ALBUTEROL SULFATE (2.5 MG/3ML) 0.083% NEBU:  nebulizer treatment twice a day, 2 puffs every 4-6 hours  Patient Instructions: 1)  Asthma - medication refills have been sent to the pharmacy 2)  Review your asthma action plan and know what to do when you asthma symptoms start 3)  You have been given a copy of your immunization record 4)  Your yearly labs will be checked today and you will be notified of the results 5)  Follow up in 6 months or sooner for asthma if necessary Prescriptions: ALBUTEROL SULFATE (2.5 MG/3ML) 0.083% NEBU (ALBUTEROL SULFATE) Use one vial every 6 hours as needed for shortness of breath  #100 x 1   Entered and Authorized by:   Lehman Prom FNP   Signed by:   Lehman Prom FNP on 11/16/2010   Method used:   Faxed to ...       [facility] (retail)       [address] [place], [place]  [address]       Ph: [phone]       Fax: [phone]   RxID:    [id number] LOTRISONE 1-0.05 % CREA (CLOTRIMAZOLE-BETAMETHASONE) One application topically to affected area two times a day until healed  #60gm x 0   Entered and Authorized by:   Lehman Prom FNP   Signed by:   Lehman Prom FNP on 11/16/2010   Method used:   Faxed to ...       [facility] - [facility] (retail)       [address] [place], [place]  [address]  Ph: [phone]       Fax: [phone]   RxID:   [id number] ADVAIR DISKUS 250-50 MCG/DOSE AEPB (FLUTICASONE-SALMETEROL) 1 inhalation two times a day  #1 x 11   Entered and Authorized by:   Lehman Prom FNP   Signed by:   Lehman Prom FNP on 11/16/2010   Method used:   Faxed to ...       [facility] - [facility] (retail)       [address] [place], [place]  [address]       Ph: [phone]       Fax: [phone]   RxID:   [id number]    Orders Added: 1)  Est. Patient Level III [32951] 2)  Peak Flow Rate [94150] 3)  Pulse Oximetry (single measurment) [94760] 4)  T-Lipid Profile [80061-22930] 5)  T-Comprehensive Metabolic Panel [80053-22900] 6)  T-CBC w/Diff [88416-60630] 7)  Rapid HIV  [92370] 8)  T-Syphilis Test (RPR) [16010-93235] 9)  T-TSH [57322-02542]  Appended Document: neg HIV    Clinical Lists Changes  Observations: Added new observation of HIVRAPIDRSLT: negative (11/16/2010 11:40)      Laboratory Results    Other Tests  Rapid HIV: negative   Appended Document: neg rapid HIV     Clinical Lists Changes  Observations: Added new observation of HIVRAPIDRSLT: negative (11/20/2010 9:46)      Laboratory Results    Other Tests  Rapid HIV: negative

## 2010-12-20 ENCOUNTER — Encounter: Payer: Self-pay | Admitting: Nurse Practitioner

## 2010-12-20 ENCOUNTER — Encounter (INDEPENDENT_AMBULATORY_CARE_PROVIDER_SITE_OTHER): Payer: Self-pay | Admitting: Nurse Practitioner

## 2010-12-20 ENCOUNTER — Telehealth (INDEPENDENT_AMBULATORY_CARE_PROVIDER_SITE_OTHER): Payer: Self-pay | Admitting: Nurse Practitioner

## 2010-12-20 DIAGNOSIS — E669 Obesity, unspecified: Secondary | ICD-10-CM | POA: Insufficient documentation

## 2010-12-20 DIAGNOSIS — S058X9A Other injuries of unspecified eye and orbit, initial encounter: Secondary | ICD-10-CM | POA: Insufficient documentation

## 2010-12-26 NOTE — Assessment & Plan Note (Signed)
Summary: Corneal Abrasion   Vital Signs:  Patient profile:   25 year old male Weight:      241.8 pounds BSA:     2.19 Temp:     97.3 degrees F oral Pulse rate:   78 / minute Pulse rhythm:   regular Resp:     20 per minute BP sitting:   158 / 90  (left arm) Cuff size:   regular  Vitals Entered By: Gaylyn Cheers RN (December 20, 2010 10:54 AM)  CC: left eye irritated fills like dust in eye, watering, unable to close completely for past week. Lt ear "feels staticey" for past month. Denies any vision changes. Sm amt clear yellow nasal D/C Cason Dabney colored in AM. Bit lower lip 1 wk ago still not healed. Is Patient Diabetic? Yes Pain Assessment Patient in pain? no       Does patient need assistance? Functional Status Self care Ambulation Normal Comments Takes only Advair and uses Albuterol as needed no other medications.   Primary Care Provider:  Tereso Newcomer, PA-C  CC:  left eye irritated fills like dust in eye, watering, and unable to close completely for past week. Lt ear "feels staticey" for past month. Denies any vision changes. Sm amt clear yellow nasal D/C Laryah Neuser colored in AM. Bit lower lip 1 wk ago still not healed.Marland Kitchen  History of Present Illness:  Pt into the office the office with c/o irritated eye left eye irritation +tearing +feels like there is foriegn object in the left eye +some dullness in the left ear that was there even prior to onset of eye problem  No problems with asthma Presents today with male significant other  Allergies: No Known Drug Allergies  Review of Systems General:  Denies fever. Eyes:  Complains of discharge and itching; denies blurring, red eye, and vision loss-1 eye. ENT:  Complains of decreased hearing; left ear.  Physical Exam  General:  alert.   Head:  normocephalic.   Eyes:  pupils round and pupils reactive to light.   left eye with sensitivity to light but  Ears:  bil ears with clear fluid - no erythema minimal cerumen Msk:   normal ROM.   Neurologic:  alert & oriented X3.   Skin:  color normal.   Psych:  Oriented X3.     Impression & Recommendations:  Problem # 1:  CORNEAL ABRASION, LEFT (ICD-918.1) handout given advised pt to purchase and wear eye patch no signs of infection but warning signs given to pt  Problem # 2:  ELEVATED BLOOD PRESSURE WITHOUT DIAGNOSIS OF HYPERTENSION (ICD-796.2)  handout given  DASH diet  weight loss  Problem # 3:  OBESITY (ICD-278.00) advised weight loss for both BP and for healthy lifestyle  Complete Medication List: 1)  Advair Diskus 250-50 Mcg/dose Aepb (Fluticasone-salmeterol) .Marland Kitchen.. 1 inhalation two times a day 2)  Ventolin Hfa 108 (90 Base) Mcg/act Aers (Albuterol sulfate) .... 2 puffs with aerochamber q 4-6 hrs as needed 3)  Vitamin D 400 Unit Tabs (Cholecalciferol) .... Take 2 by mouth once daily 4)  Loratadine 10 Mg Tabs (Loratadine) .... Take 1 tablet by mouth once a day as needed for allergy symptoms 5)  Lotrisone 1-0.05 % Crea (Clotrimazole-betamethasone) .... One application topically to affected area two times a day until healed 6)  Albuterol Sulfate (2.5 Mg/41ml) 0.083% Nebu (Albuterol sulfate) .... Use one vial every 6 hours as needed for shortness of breath  Other Orders: Peak Flow Rate (94150) Pulse Oximetry (single measurment) (  16109)  Patient Instructions: 1)  Read the handout 2)  Wear the eye patch for at least the next 3 days.  You may need to keep the eye closed to prevent healing. 3)  Blood pressure is still high.  Read the handout and make lifestyle changes to lower blood pressure.  If still high in 4-6 weeks after checking at home you will need to return for a follow up visit   Orders Added: 1)  Peak Flow Rate [94150] 2)  Pulse Oximetry (single measurment) [94760] 3)  Est. Patient Level III [60454]

## 2010-12-26 NOTE — Progress Notes (Signed)
Summary: LEFT EYE & EAR PAIN   Phone Note Call from Patient   Reason for Call: Acute Illness Summary of Call: PT IS HAVING A LEFT EYE AND LEFT EAR PAIN PLEASE, CALL HER @ [phone]  Initial call taken by: Cheryll Dessert,  December 20, 2010 8:50 AM  Follow-up for Phone Call        Left eye discomfort started about a week ago -- began tearing.  Denies trauma, drainage is clear, like tears, but is getting worse.  Eye is reddened, no discernible swelling.  Noticed change in left ear about 3 weeks ago -- denies pain or trauma, uses swabs to clean ears.  Denies redness or drainage, states "sounds static like a bad speaker".  States he does have some nasal congestion -- is starting to worry about lack of resolution.  Appt. this morning with provider.   Follow-up by: Dutch Quint RN,  December 20, 2010 10:02 AM  Additional Follow-up for Phone Call Additional follow up Details #1::        Pt. in office.  Dutch Quint RN  December 20, 2010 10:57 AM

## 2010-12-26 NOTE — Letter (Signed)
Summary: Handout Printed  Printed Handout:  - Eye - Corneal Abrasion 

## 2013-04-05 ENCOUNTER — Encounter: Payer: Self-pay | Admitting: Internal Medicine

## 2013-04-05 ENCOUNTER — Other Ambulatory Visit (INDEPENDENT_AMBULATORY_CARE_PROVIDER_SITE_OTHER): Payer: BC Managed Care – PPO

## 2013-04-05 ENCOUNTER — Ambulatory Visit (INDEPENDENT_AMBULATORY_CARE_PROVIDER_SITE_OTHER): Payer: BC Managed Care – PPO | Admitting: Internal Medicine

## 2013-04-05 VITALS — BP 128/90 | HR 54 | Temp 97.4°F | Resp 16 | Ht 66.0 in | Wt 242.5 lb

## 2013-04-05 DIAGNOSIS — Z0000 Encounter for general adult medical examination without abnormal findings: Secondary | ICD-10-CM

## 2013-04-05 DIAGNOSIS — J4530 Mild persistent asthma, uncomplicated: Secondary | ICD-10-CM

## 2013-04-05 DIAGNOSIS — E559 Vitamin D deficiency, unspecified: Secondary | ICD-10-CM

## 2013-04-05 DIAGNOSIS — Z23 Encounter for immunization: Secondary | ICD-10-CM

## 2013-04-05 DIAGNOSIS — I10 Essential (primary) hypertension: Secondary | ICD-10-CM

## 2013-04-05 LAB — COMPREHENSIVE METABOLIC PANEL
AST: 21 U/L (ref 0–37)
Albumin: 3.8 g/dL (ref 3.5–5.2)
Alkaline Phosphatase: 40 U/L (ref 39–117)
BUN: 17 mg/dL (ref 6–23)
Calcium: 9.1 mg/dL (ref 8.4–10.5)
Chloride: 107 mEq/L (ref 96–112)
Creatinine, Ser: 0.8 mg/dL (ref 0.4–1.5)
Glucose, Bld: 88 mg/dL (ref 70–99)

## 2013-04-05 LAB — LIPID PANEL
LDL Cholesterol: 103 mg/dL — ABNORMAL HIGH (ref 0–99)
Total CHOL/HDL Ratio: 4

## 2013-04-05 LAB — URINALYSIS, ROUTINE W REFLEX MICROSCOPIC
Leukocytes, UA: NEGATIVE
Nitrite: NEGATIVE
RBC / HPF: NONE SEEN (ref 0–?)
Specific Gravity, Urine: 1.02 (ref 1.000–1.030)
Urobilinogen, UA: 0.2 (ref 0.0–1.0)
pH: 6.5 (ref 5.0–8.0)

## 2013-04-05 LAB — CBC WITH DIFFERENTIAL/PLATELET
Basophils Relative: 0.4 % (ref 0.0–3.0)
Eosinophils Absolute: 0.3 10*3/uL (ref 0.0–0.7)
Eosinophils Relative: 6.4 % — ABNORMAL HIGH (ref 0.0–5.0)
Hemoglobin: 13.4 g/dL (ref 13.0–17.0)
Lymphocytes Relative: 26.6 % (ref 12.0–46.0)
MCHC: 33.7 g/dL (ref 30.0–36.0)
Neutro Abs: 2.7 10*3/uL (ref 1.4–7.7)
Neutrophils Relative %: 51.2 % (ref 43.0–77.0)
RBC: 4.62 Mil/uL (ref 4.22–5.81)
WBC: 5.3 10*3/uL (ref 4.5–10.5)

## 2013-04-05 MED ORDER — LISINOPRIL 20 MG PO TABS: 20.0000 mg | ORAL_TABLET | Freq: Every day | ORAL | Status: DC

## 2013-04-05 NOTE — Patient Instructions (Signed)
Health Maintenance, Males A healthy lifestyle and preventative care can promote health and wellness.  Maintain regular health, dental, and eye exams.  Eat a healthy diet. Foods like vegetables, fruits, whole grains, low-fat dairy products, and lean protein foods contain the nutrients you need without too many calories. Decrease your intake of foods high in solid fats, added sugars, and salt. Get information about a proper diet from your caregiver, if necessary.  Regular physical exercise is one of the most important things you can do for your health. Most adults should get at least 150 minutes of moderate-intensity exercise (any activity that increases your heart rate and causes you to sweat) each week. In addition, most adults need muscle-strengthening exercises on 2 or more days a week.   Maintain a healthy weight. The body mass index (BMI) is a screening tool to identify possible weight problems. It provides an estimate of body fat based on height and weight. Your caregiver can help determine your BMI, and can help you achieve or maintain a healthy weight. For adults 20 years and older:  A BMI below 18.5 is considered underweight.  A BMI of 18.5 to 24.9 is normal.  A BMI of 25 to 29.9 is considered overweight.  A BMI of 30 and above is considered obese.  Maintain normal blood lipids and cholesterol by exercising and minimizing your intake of saturated fat. Eat a balanced diet with plenty of fruits and vegetables. Blood tests for lipids and cholesterol should begin at age 20 and be repeated every 5 years. If your lipid or cholesterol levels are high, you are over 50, or you are a high risk for heart disease, you may need your cholesterol levels checked more frequently.Ongoing high lipid and cholesterol levels should be treated with medicines, if diet and exercise are not effective.  If you smoke, find out from your caregiver how to quit. If you do not use tobacco, do not start.  If you  choose to drink alcohol, do not exceed 2 drinks per day. One drink is considered to be 12 ounces (355 mL) of beer, 5 ounces (148 mL) of wine, or 1.5 ounces (44 mL) of liquor.  Avoid use of street drugs. Do not share needles with anyone. Ask for help if you need support or instructions about stopping the use of drugs.  High blood pressure causes heart disease and increases the risk of stroke. Blood pressure should be checked at least every 1 to 2 years. Ongoing high blood pressure should be treated with medicines if weight loss and exercise are not effective.  If you are 45 to 27 years old, ask your caregiver if you should take aspirin to prevent heart disease.  Diabetes screening involves taking a blood sample to check your fasting blood sugar level. This should be done once every 3 years, after age 45, if you are within normal weight and without risk factors for diabetes. Testing should be considered at a younger age or be carried out more frequently if you are overweight and have at least 1 risk factor for diabetes.  Colorectal cancer can be detected and often prevented. Most routine colorectal cancer screening begins at the age of 50 and continues through age 75. However, your caregiver may recommend screening at an earlier age if you have risk factors for colon cancer. On a yearly basis, your caregiver may provide home test kits to check for hidden blood in the stool. Use of a small camera at the end of a tube,   to directly examine the colon (sigmoidoscopy or colonoscopy), can detect the earliest forms of colorectal cancer. Talk to your caregiver about this at age 50, when routine screening begins. Direct examination of the colon should be repeated every 5 to 10 years through age 75, unless early forms of pre-cancerous polyps or small growths are found.  Hepatitis C blood testing is recommended for all people born from 1945 through 1965 and any individual with known risks for hepatitis C.  Healthy  men should no longer receive prostate-specific antigen (PSA) blood tests as part of routine cancer screening. Consult with your caregiver about prostate cancer screening.  Testicular cancer screening is not recommended for adolescents or adult males who have no symptoms. Screening includes self-exam, caregiver exam, and other screening tests. Consult with your caregiver about any symptoms you have or any concerns you have about testicular cancer.  Practice safe sex. Use condoms and avoid high-risk sexual practices to reduce the spread of sexually transmitted infections (STIs).  Use sunscreen with a sun protection factor (SPF) of 30 or greater. Apply sunscreen liberally and repeatedly throughout the day. You should seek shade when your shadow is shorter than you. Protect yourself by wearing long sleeves, pants, a wide-brimmed hat, and sunglasses year round, whenever you are outdoors.  Notify your caregiver of new moles or changes in moles, especially if there is a change in shape or color. Also notify your caregiver if a mole is larger than the size of a pencil eraser.  A one-time screening for abdominal aortic aneurysm (AAA) and surgical repair of large AAAs by sound wave imaging (ultrasonography) is recommended for ages 65 to 75 years who are current or former smokers.  Stay current with your immunizations. Document Released: 04/18/2008 Document Revised: 01/13/2012 Document Reviewed: 03/18/2011 [employer] Patient Information 2014 [employer].  

## 2013-04-05 NOTE — Progress Notes (Signed)
Subjective:    Patient ID: Luke Gross, male    DOB: Jan 08, 1986, 27 y.o.   MRN: [id number]  Asthma He complains of wheezing. There is no chest tightness, cough, difficulty breathing, frequent throat clearing, hemoptysis, hoarse voice, shortness of breath or sputum production. This is a chronic problem. The current episode started more than 1 year ago. The problem occurs rarely. The problem has been gradually improving. Associated symptoms include nasal congestion, postnasal drip and rhinorrhea. Pertinent negatives include no appetite change, chest pain, dyspnea on exertion, ear congestion, ear pain, fever, headaches, heartburn, malaise/fatigue, myalgias, orthopnea, PND, sneezing, sore throat, sweats, trouble swallowing or weight loss. His symptoms are aggravated by nothing. His symptoms are alleviated by steroid inhaler and beta-agonist. He reports significant improvement on treatment. There are no known risk factors for lung disease. His past medical history is significant for asthma.      Review of Systems  Constitutional: Negative.  Negative for fever, chills, weight loss, malaise/fatigue, diaphoresis, activity change, appetite change, fatigue and unexpected weight change.  HENT: Positive for congestion, rhinorrhea and postnasal drip. Negative for ear pain, nosebleeds, sore throat, hoarse voice, facial swelling, sneezing, trouble swallowing, neck stiffness, dental problem, sinus pressure and tinnitus.   Eyes: Negative.   Respiratory: Positive for wheezing. Negative for cough, hemoptysis, sputum production, shortness of breath and stridor.   Cardiovascular: Negative.  Negative for chest pain, dyspnea on exertion, palpitations, leg swelling and PND.  Gastrointestinal: Negative.  Negative for heartburn, nausea, vomiting, abdominal pain, diarrhea, constipation and blood in stool.  Endocrine: Negative.   Genitourinary: Positive for frequency. Negative for decreased urine volume, penile  swelling, scrotal swelling and penile pain.  Musculoskeletal: Negative.  Negative for myalgias.  Skin: Negative.   Allergic/Immunologic: Negative.   Neurological: Negative.  Negative for dizziness and headaches.  Hematological: Negative.  Negative for adenopathy. Does not bruise/bleed easily.  Psychiatric/Behavioral: Negative.        Objective:   Physical Exam  Vitals reviewed. Constitutional: He is oriented to person, place, and time. He appears well-developed and well-nourished. No distress.  HENT:  Head: Normocephalic and atraumatic.  Mouth/Throat: Oropharynx is clear and moist. No oropharyngeal exudate.  Eyes: Conjunctivae are normal. Right eye exhibits no discharge. Left eye exhibits no discharge. No scleral icterus.  Neck: Normal range of motion. Neck supple. No JVD present. No tracheal deviation present. No thyromegaly present.  Cardiovascular: Normal rate, regular rhythm, normal heart sounds and intact distal pulses.  Exam reveals no gallop and no friction rub.   No murmur heard. Pulmonary/Chest: Effort normal and breath sounds normal. No stridor. No respiratory distress. He has no wheezes. He has no rales. He exhibits no tenderness.  Abdominal: Soft. Bowel sounds are normal. He exhibits no distension and no mass. There is no tenderness. There is no rebound and no guarding. Hernia confirmed negative in the right inguinal area and confirmed negative in the left inguinal area.  Genitourinary: Testes normal and penis normal. Right testis shows no mass, no swelling and no tenderness. Right testis is descended. Left testis shows no mass, no swelling and no tenderness. Left testis is descended. Circumcised. No paraphimosis, penile erythema or penile tenderness. No discharge found.  Musculoskeletal: Normal range of motion. He exhibits no edema and no tenderness.  Lymphadenopathy:    He has no cervical adenopathy.       Right: No inguinal adenopathy present.       Left: No inguinal  adenopathy present.  Neurological: He is oriented to person, place,  and time.  Skin: Skin is warm and dry. No rash noted. He is not diaphoretic. No erythema. No pallor.  Psychiatric: He has a normal mood and affect. His behavior is normal. Judgment and thought content normal.     Lab Results  Component Value Date   WBC 4.6 11/16/2010   HGB 14.0 11/16/2010   HCT 44.3 11/16/2010   PLT 215 11/16/2010   GLUCOSE 94 11/16/2010   CHOL 161 11/16/2010   TRIG 68 11/16/2010   HDL 38* 11/16/2010   LDLCALC 109* 11/16/2010   ALT 15 11/16/2010   AST 15 11/16/2010   NA 142 11/16/2010   K 4.5 11/16/2010   CL 107 11/16/2010   CREATININE 0.85 11/16/2010   BUN 15 11/16/2010   CO2 25 11/16/2010   TSH 0.732 11/16/2010       Assessment & Plan:

## 2013-04-05 NOTE — Assessment & Plan Note (Signed)
Exam done Labs ordered Vaccines were updated Pt ed material was given 

## 2013-04-06 ENCOUNTER — Encounter: Payer: Self-pay | Admitting: Internal Medicine

## 2013-04-06 MED ORDER — CHOLECALCIFEROL 1.25 MG (50000 UT) PO TABS: 1.0000 | ORAL_TABLET | ORAL | Status: DC

## 2013-04-06 NOTE — Addendum Note (Signed)
Addended by: Etta Grandchild on: 04/06/2013 07:56 AM   Modules accepted: Orders

## 2013-04-09 ENCOUNTER — Encounter: Payer: Self-pay | Admitting: Internal Medicine

## 2013-04-09 MED ORDER — VITAMIN D 1000 UNITS PO TABS: 1000.0000 [IU] | ORAL_TABLET | Freq: Every day | ORAL | Status: DC

## 2013-04-09 NOTE — Telephone Encounter (Signed)
Refills to [facility]

## 2013-08-29 ENCOUNTER — Other Ambulatory Visit: Payer: Self-pay | Admitting: Internal Medicine

## 2013-09-02 ENCOUNTER — Encounter: Payer: Self-pay | Admitting: Internal Medicine

## 2013-09-02 ENCOUNTER — Ambulatory Visit (INDEPENDENT_AMBULATORY_CARE_PROVIDER_SITE_OTHER): Payer: BC Managed Care – PPO | Admitting: Internal Medicine

## 2013-09-02 ENCOUNTER — Ambulatory Visit (INDEPENDENT_AMBULATORY_CARE_PROVIDER_SITE_OTHER)
Admission: RE | Admit: 2013-09-02 | Discharge: 2013-09-02 | Disposition: A | Discharge status: Home / Self Care | Payer: BC Managed Care – PPO | Source: Ambulatory Visit | Attending: Internal Medicine | Admitting: Internal Medicine

## 2013-09-02 VITALS — BP 122/70 | HR 58 | Temp 98.4°F | Resp 16 | Ht 66.0 in | Wt 244.2 lb

## 2013-09-02 DIAGNOSIS — S6980XA Other specified injuries of unspecified wrist, hand and finger(s), initial encounter: Secondary | ICD-10-CM

## 2013-09-02 DIAGNOSIS — S62501A Fracture of unspecified phalanx of right thumb, initial encounter for closed fracture: Secondary | ICD-10-CM | POA: Insufficient documentation

## 2013-09-02 DIAGNOSIS — S62609A Fracture of unspecified phalanx of unspecified finger, initial encounter for closed fracture: Secondary | ICD-10-CM

## 2013-09-02 DIAGNOSIS — S6990XA Unspecified injury of unspecified wrist, hand and finger(s), initial encounter: Secondary | ICD-10-CM

## 2013-09-02 DIAGNOSIS — S6991XA Unspecified injury of right wrist, hand and finger(s), initial encounter: Secondary | ICD-10-CM

## 2013-09-02 DIAGNOSIS — J4530 Mild persistent asthma, uncomplicated: Secondary | ICD-10-CM

## 2013-09-02 DIAGNOSIS — I10 Essential (primary) hypertension: Secondary | ICD-10-CM

## 2013-09-02 NOTE — Progress Notes (Signed)
Subjective:    Patient ID: Luke Gross, male    DOB: 1986/01/05, 27 y.o.   MRN: [id number]  Hand Pain  Incident onset: 3 weeks ago. The injury mechanism was a direct blow (playing Bball, he jammed his left thumb). The pain is present in the left hand. The quality of the pain is described as aching. The pain does not radiate. The pain is at a severity of 1/10. The pain is mild. The pain has been intermittent since the incident. Pertinent negatives include no chest pain, muscle weakness, numbness or tingling. Nothing aggravates the symptoms. He has tried acetaminophen for the symptoms. The treatment provided mild relief.      Review of Systems  Constitutional: Negative.  Negative for fever, chills, diaphoresis, appetite change and fatigue.  HENT: Negative.   Eyes: Negative.   Respiratory: Negative.  Negative for cough, choking, chest tightness, shortness of breath, wheezing and stridor.   Cardiovascular: Negative.  Negative for chest pain, palpitations and leg swelling.  Gastrointestinal: Negative.   Endocrine: Negative.   Genitourinary: Negative.   Musculoskeletal: Positive for arthralgias. Negative for back pain, gait problem, joint swelling, myalgias, neck pain and neck stiffness.  Skin: Negative.   Allergic/Immunologic: Negative.   Neurological: Negative.  Negative for dizziness, tingling, weakness, light-headedness and numbness.  Hematological: Negative.  Negative for adenopathy. Does not bruise/bleed easily.  Psychiatric/Behavioral: Negative.        Objective:   Physical Exam  Vitals reviewed. Constitutional: He is oriented to person, place, and time. He appears well-developed and well-nourished. No distress.  HENT:  Head: Normocephalic and atraumatic.  Mouth/Throat: Oropharynx is clear and moist. No oropharyngeal exudate.  Eyes: Conjunctivae are normal. Right eye exhibits no discharge. Left eye exhibits no discharge. No scleral icterus.  Neck: Normal range of  motion. Neck supple. No JVD present. No tracheal deviation present. No thyromegaly present.  Cardiovascular: Normal rate, regular rhythm, normal heart sounds and intact distal pulses.  Exam reveals no gallop and no friction rub.   No murmur heard. Pulmonary/Chest: Effort normal and breath sounds normal. No stridor. No respiratory distress. He has no wheezes. He has no rales. He exhibits no tenderness.  Abdominal: Soft. Bowel sounds are normal. He exhibits no distension and no mass. There is no tenderness. There is no rebound and no guarding.  Musculoskeletal: Normal range of motion. He exhibits no edema and no tenderness.       Right hand: He exhibits deformity. He exhibits normal range of motion, no tenderness, no bony tenderness, normal two-point discrimination, normal capillary refill and no swelling. Normal sensation noted.       Left hand: He exhibits normal range of motion, no tenderness, no bony tenderness, normal two-point discrimination, normal capillary refill, no deformity, no laceration and no swelling. Normal sensation noted. Decreased sensation is not present in the ulnar distribution, is not present in the medial redistribution and is not present in the radial distribution. Normal strength noted.       Hands: Lymphadenopathy:    He has no cervical adenopathy.  Neurological: He is oriented to person, place, and time.  Skin: Skin is warm and dry. No rash noted. He is not diaphoretic. No erythema. No pallor.      Lab Results  Component Value Date   WBC 5.3 04/05/2013   HGB 13.4 04/05/2013   HCT 39.8 04/05/2013   PLT 191.0 04/05/2013   GLUCOSE 88 04/05/2013   CHOL 152 04/05/2013   TRIG 40.0 04/05/2013   HDL 40.60  04/05/2013   LDLCALC 103* 04/05/2013   ALT 18 04/05/2013   AST 21 04/05/2013   NA 138 04/05/2013   K 4.0 04/05/2013   CL 107 04/05/2013   CREATININE 0.8 04/05/2013   BUN 17 04/05/2013   CO2 26 04/05/2013   TSH 0.64 04/05/2013      Assessment & Plan:

## 2013-09-02 NOTE — Assessment & Plan Note (Signed)
His BP is well controlled 

## 2013-09-02 NOTE — Assessment & Plan Note (Signed)
Plastic thumb splint applied He will see ortho for further evaluation

## 2013-09-02 NOTE — Patient Instructions (Signed)
Thumb Sprain °Your exam shows you have a sprained thumb. This means the ligaments around the joint have been torn. Thumb sprains usually take 3-6 weeks to heal. However, severe, unstable sprains may need to be fixed surgically. Sometimes a small piece of bone is pulled off by the ligament. If this is not treated properly, a sprained thumb can lead to a painful, weak joint. Treatment helps reduce pain and shortens the period of disability. °The thumb, and often the wrist, must remain splinted for the first 2-4 weeks to protect the joint. Keep your hand elevated and apply ice packs frequently to the injured area (20-30 minutes every 2-3 hours) for the next 2-4 days. This helps reduce swelling and control pain. Pain medicine may also be used for several days. Motion and strengthening exercises may later be prescribed for the joint to return to normal function. Be sure to see your doctor for follow-up because your thumb joint may require further support with splints, bandages or tape. Please see your doctor or go to the emergency room right away if you have increased pain despite proper treatment, or a numb, cold, or pale thumb. °Document Released: 11/28/2004 Document Revised: 01/13/2012 Document Reviewed: 10/22/2008 °[employer]® Patient Information ©2014 [employer]. ° °

## 2013-09-02 NOTE — Assessment & Plan Note (Signed)
Plain film is + for fracture

## 2013-10-04 ENCOUNTER — Ambulatory Visit (INDEPENDENT_AMBULATORY_CARE_PROVIDER_SITE_OTHER)
Admission: RE | Admit: 2013-10-04 | Discharge: 2013-10-04 | Disposition: A | Discharge status: Home / Self Care | Payer: BC Managed Care – PPO | Source: Ambulatory Visit | Attending: Internal Medicine | Admitting: Internal Medicine

## 2013-10-04 ENCOUNTER — Encounter: Payer: Self-pay | Admitting: Internal Medicine

## 2013-10-04 ENCOUNTER — Ambulatory Visit (INDEPENDENT_AMBULATORY_CARE_PROVIDER_SITE_OTHER): Payer: BC Managed Care – PPO | Admitting: Internal Medicine

## 2013-10-04 VITALS — BP 138/84 | HR 54 | Temp 98.1°F | Resp 16 | Ht 67.0 in | Wt 252.0 lb

## 2013-10-04 DIAGNOSIS — S62501S Fracture of unspecified phalanx of right thumb, sequela: Secondary | ICD-10-CM

## 2013-10-04 DIAGNOSIS — S42309S Unspecified fracture of shaft of humerus, unspecified arm, sequela: Secondary | ICD-10-CM

## 2013-10-04 NOTE — Assessment & Plan Note (Signed)
The xray shows persistent fracture in the joint I will send another referral to ortho

## 2013-10-04 NOTE — Progress Notes (Signed)
   Subjective:    Patient ID: Luke Gross, male    DOB: January 07, 1986, 27 y.o.   MRN: [id number]  HPI Comments: He returns for f/up regarding the fracture in his right thumb. The pain and swelling has resolved. He did not see ortho but he has been wearing the splint that I gave him.     Review of Systems  All other systems reviewed and are negative.       Objective:   Physical Exam  Musculoskeletal:       Right hand: He exhibits normal range of motion, no tenderness, no bony tenderness, normal two-point discrimination, normal capillary refill, no deformity, no laceration and no swelling.       Hands:   Lab Results  Component Value Date   WBC 5.3 04/05/2013   HGB 13.4 04/05/2013   HCT 39.8 04/05/2013   PLT 191.0 04/05/2013   GLUCOSE 88 04/05/2013   CHOL 152 04/05/2013   TRIG 40.0 04/05/2013   HDL 40.60 04/05/2013   LDLCALC 103* 04/05/2013   ALT 18 04/05/2013   AST 21 04/05/2013   NA 138 04/05/2013   K 4.0 04/05/2013   CL 107 04/05/2013   CREATININE 0.8 04/05/2013   BUN 17 04/05/2013   CO2 26 04/05/2013   TSH 0.64 04/05/2013        Assessment & Plan:

## 2013-10-04 NOTE — Progress Notes (Signed)
Pre visit review using our clinic review tool, if applicable. No additional management support is needed unless otherwise documented below in the visit note. 

## 2013-10-04 NOTE — Patient Instructions (Signed)
Finger Fracture  Fractures of fingers are breaks in the bones of the fingers. There are many types of fractures. There are different ways of treating these fractures, all of which can be correct. Your caregiver will discuss the best way to treat your fracture.  TREATMENT   Finger fractures can be treated with:   · Non-reduction - this means the bones are in place. The finger is splinted without changing the positions of the bone pieces. The splint is usually left on for about a week to ten days. This will depend on your fracture and what your caregiver thinks.  · Closed reduction - the bones are put back into position without using surgery. The finger is then splinted.  · ORIF (open reduction and internal fixation) - the fracture site is opened. Then the bone pieces are fixed into place with pins or some type of hardware. This is seldom required. It depends on the severity of the fracture.  Your caregiver will discuss the type of fracture you have and the treatment that will be best for that problem. If surgery is the treatment of choice, the following is information for you to know and also let your caregiver know about prior to surgery.  LET YOUR CAREGIVER KNOW ABOUT:  · Allergies  · Medications taken including herbs, eye drops, over the counter medications, and creams  · Use of steroids (by mouth or creams)  · Previous problems with anesthetics or Novocaine  · Possibility of pregnancy, if this applies  · History of blood clots (thrombophlebitis)  · History of bleeding or blood problems  · Previous surgery  · Other health problems  AFTER THE PROCEDURE  After surgery, you will be taken to the recovery area where a nurse will check your progress. Once you're awake, stable, and taking fluids well, barring other problems you will be allowed to go home. Once home an ice pack applied to your operative site may help with discomfort and keep the swelling down.  HOME CARE INSTRUCTIONS   · Follow your caregiver's  instructions as to activities, exercises, physical therapy, and driving a car.  · Use your finger and exercise as directed.  · Only take over-the-counter or prescription medicines for pain, discomfort, or fever as directed by your caregiver. Do not take aspirin until your caregiver OK's it, as this can increase bleeding immediately following surgery.  · Stop using ibuprofen if it upsets your stomach. Let your caregiver know about it.  SEEK MEDICAL CARE IF:  · You have increased bleeding (more than a small spot) from the wound or from beneath your splint.  · You develop redness, swelling, or increasing pain in the wound or from beneath your splint.  · There is pus coming from the wound or from beneath your splint.  · An unexplained oral temperature above 102° F (38.9° C) develops, or as your caregiver suggests.  · There is a foul smell coming from the wound or dressing or from beneath your splint.  SEEK IMMEDIATE MEDICAL CARE IF:   · You develop a rash.  · You have difficulty breathing.  · You have any allergic problems.  MAKE SURE YOU:   · Understand these instructions.  · Will watch your condition.  · Will get help right away if you are not doing well or get worse.  Document Released: 02/02/2001 Document Revised: 01/13/2012 Document Reviewed: 06/09/2008  [employer]® Patient Information ©2014 [employer].

## 2014-02-28 ENCOUNTER — Other Ambulatory Visit: Payer: Self-pay | Admitting: Internal Medicine

## 2014-02-28 ENCOUNTER — Encounter: Payer: Self-pay | Admitting: Internal Medicine

## 2014-02-28 ENCOUNTER — Ambulatory Visit (INDEPENDENT_AMBULATORY_CARE_PROVIDER_SITE_OTHER): Payer: BC Managed Care – PPO | Admitting: Internal Medicine

## 2014-02-28 VITALS — BP 138/84 | HR 72 | Temp 98.7°F | Resp 20 | Ht 67.0 in | Wt 230.2 lb

## 2014-02-28 DIAGNOSIS — J4530 Mild persistent asthma, uncomplicated: Secondary | ICD-10-CM

## 2014-02-28 DIAGNOSIS — J45901 Unspecified asthma with (acute) exacerbation: Secondary | ICD-10-CM

## 2014-02-28 DIAGNOSIS — J45909 Unspecified asthma, uncomplicated: Secondary | ICD-10-CM

## 2014-02-28 DIAGNOSIS — J309 Allergic rhinitis, unspecified: Secondary | ICD-10-CM

## 2014-02-28 DIAGNOSIS — I10 Essential (primary) hypertension: Secondary | ICD-10-CM

## 2014-02-28 MED ORDER — METHYLPREDNISOLONE 4 MG PO KIT: PACK | ORAL | Status: DC

## 2014-02-28 MED ORDER — METHYLPREDNISOLONE ACETATE 80 MG/ML IJ SUSP
120.0000 mg | Freq: Once | INTRAMUSCULAR | Status: AC
  Administered 2014-02-28: 120 mg via INTRAMUSCULAR

## 2014-02-28 MED ORDER — FLUTICASONE-SALMETEROL 250-50 MCG/DOSE IN AEPB
1.0000 | INHALATION_SPRAY | RESPIRATORY_TRACT | Status: DC
Start: 2014-02-28 — End: 2015-09-14

## 2014-02-28 MED ORDER — ALBUTEROL SULFATE (2.5 MG/3ML) 0.083% IN NEBU
2.5000 mg | INHALATION_SOLUTION | Freq: Once | RESPIRATORY_TRACT | Status: AC
  Administered 2014-02-28: 2.5 mg via RESPIRATORY_TRACT

## 2014-02-28 MED ORDER — ALBUTEROL SULFATE HFA 108 (90 BASE) MCG/ACT IN AERS: 1.0000 | INHALATION_SPRAY | Freq: Four times a day (QID) | RESPIRATORY_TRACT | Status: DC | PRN

## 2014-02-28 MED ORDER — ALBUTEROL SULFATE (5 MG/ML) 0.5% IN NEBU: 2.5000 mg | INHALATION_SOLUTION | Freq: Four times a day (QID) | RESPIRATORY_TRACT | Status: DC | PRN

## 2014-02-28 NOTE — Assessment & Plan Note (Signed)
Restart advair diskus

## 2014-02-28 NOTE — Patient Instructions (Signed)
Asthma, Adult Asthma is a recurring condition in which the airways tighten and narrow. Asthma can make it difficult to breathe. It can cause coughing, wheezing, and shortness of breath. Asthma episodes (also called asthma attacks) range from minor to life-threatening. Asthma cannot be cured, but medicines and lifestyle changes can help control it. CAUSES Asthma is believed to be caused by inherited (genetic) and environmental factors, but its exact cause is unknown. Asthma may be triggered by allergens, lung infections, or irritants in the air. Asthma triggers are different for each person. Common triggers include:   Animal dander.  Dust mites.  Cockroaches.  Pollen from trees or grass.  Mold.  Smoke.  Air pollutants such as dust, household cleaners, hair sprays, aerosol sprays, paint fumes, strong chemicals, or strong odors.  Cold air, weather changes, and winds (which increase molds and pollens in the air).  Strong emotional expressions such as crying or laughing hard.  Stress.  Certain medicines (such as aspirin) or types of drugs (such as beta-blockers).  Sulfites in foods and drinks. Foods and drinks that may contain sulfites include dried fruit, potato chips, and sparkling grape juice.  Infections or inflammatory conditions such as the flu, a cold, or an inflammation of the nasal membranes (rhinitis).  Gastroesophageal reflux disease (GERD).  Exercise or strenuous activity. SYMPTOMS Symptoms may occur immediately after asthma is triggered or many hours later. Symptoms include:  Wheezing.  Excessive nighttime or early morning coughing.  Frequent or severe coughing with a common cold.  Chest tightness.  Shortness of breath. DIAGNOSIS  The diagnosis of asthma is made by a review of your medical history and a physical exam. Tests may also be performed. These may include:  Lung function studies. These tests show how much air you breath in and out.  Allergy  tests.  Imaging tests such as X-rays. TREATMENT  Asthma cannot be cured, but it can usually be controlled. Treatment involves identifying and avoiding your asthma triggers. It also involves medicines. There are 2 classes of medicine used for asthma treatment:   Controller medicines. These prevent asthma symptoms from occurring. They are usually taken every day.  Reliever or rescue medicines. These quickly relieve asthma symptoms. They are used as needed and provide short-term relief. Your health care provider will help you create an asthma action plan. An asthma action plan is a written plan for managing and treating your asthma attacks. It includes a list of your asthma triggers and how they may be avoided. It also includes information on when medicines should be taken and when their dosage should be changed. An action plan may also involve the use of a device called a peak flow meter. A peak flow meter measures how well the lungs are working. It helps you monitor your condition. HOME CARE INSTRUCTIONS   Take medicine as directed by your health care provider. Speak with your health care provider if you have questions about how or when to take the medicines.  Use a peak flow meter as directed by your health care provider. Record and keep track of readings.  Understand and use the action plan to help minimize or stop an asthma attack without needing to seek medical care.  Control your home environment in the following ways to help prevent asthma attacks:  Do not smoke. Avoid being exposed to secondhand smoke.  Change your heating and air conditioning filter regularly.  Limit your use of fireplaces and wood stoves.  Get rid of pests (such as roaches and   mice) and their droppings.  Throw away plants if you see mold on them.  Clean your floors and dust regularly. Use unscented cleaning products.  Try to have someone else vacuum for you regularly. Stay out of rooms while they are being  vacuumed and for a short while afterward. If you vacuum, use a dust mask from a hardware store, a double-layered or microfilter vacuum cleaner bag, or a vacuum cleaner with a HEPA filter.  Replace carpet with wood, tile, or vinyl flooring. Carpet can trap dander and dust.  Use allergy-proof pillows, mattress covers, and box spring covers.  Wash bed sheets and blankets every week in hot water and dry them in a dryer.  Use blankets that are made of polyester or cotton.  Clean bathrooms and kitchens with bleach. If possible, have someone repaint the walls in these rooms with mold-resistant paint. Keep out of the rooms that are being cleaned and painted.  Wash hands frequently. SEEK MEDICAL CARE IF:   You have wheezing, shortness of breath, or a cough even if taking medicine to prevent attacks.  The colored mucus you cough up (sputum) is thicker than usual.  Your sputum changes from clear or white to yellow, green, gray, or bloody.  You have any problems that may be related to the medicines you are taking (such as a rash, itching, swelling, or trouble breathing).  You are using a reliever medicine more than 2 3 times per week.  Your peak flow is still at 50 79% of you personal best after following your action plan for 1 hour. SEEK IMMEDIATE MEDICAL CARE IF:   You seem to be getting worse and are unresponsive to treatment during an asthma attack.  You are short of breath even at rest.  You get short of breath when doing very little physical activity.  You have difficulty eating, drinking, or talking due to asthma symptoms.  You develop chest pain.  You develop a fast heartbeat.  You have a bluish color to your lips or fingernails.  You are lightheaded, dizzy, or faint.  Your peak flow is less than 50% of your personal best.  You have a fever or persistent symptoms for more than 2 3 days.  You have a fever and symptoms suddenly get worse. MAKE SURE YOU:   Understand these  instructions.  Will watch your condition.  Will get help right away if you are not doing well or get worse. Document Released: 10/21/2005 Document Revised: 06/23/2013 Document Reviewed: 05/20/2013 [employer] Patient Information 2014 [employer].  

## 2014-02-28 NOTE — Assessment & Plan Note (Signed)
His BP is well controlled 

## 2014-02-28 NOTE — Progress Notes (Signed)
Subjective:    Patient ID: Luke GlassingChristopher L Gross, male    DOB: Sep 08, 1986, 28 y.o.   MRN: [id number]  Asthma He complains of chest tightness, cough, difficulty breathing, shortness of breath and wheezing. There is no frequent throat clearing, hemoptysis, hoarse voice or sputum production. This is a recurrent problem. The problem occurs constantly. The problem has been gradually worsening. The cough is non-productive. Associated symptoms include nasal congestion, postnasal drip and rhinorrhea. Pertinent negatives include no appetite change, chest pain, dyspnea on exertion, ear congestion, ear pain, fever, headaches, heartburn, malaise/fatigue, myalgias, orthopnea, PND, sneezing, sore throat, sweats, trouble swallowing or weight loss. His symptoms are aggravated by pollen. His symptoms are alleviated by beta-agonist. He reports moderate improvement on treatment. His past medical history is significant for asthma.      Review of Systems  Constitutional: Negative.  Negative for fever, chills, weight loss, malaise/fatigue, diaphoresis, appetite change and fatigue.  HENT: Positive for congestion, postnasal drip and rhinorrhea. Negative for ear pain, hoarse voice, nosebleeds, sneezing, sore throat, trouble swallowing and voice change.   Eyes: Negative.   Respiratory: Positive for cough, shortness of breath and wheezing. Negative for apnea, hemoptysis, sputum production, choking, chest tightness and stridor.   Cardiovascular: Negative.  Negative for chest pain, dyspnea on exertion, palpitations, leg swelling and PND.  Gastrointestinal: Negative.  Negative for heartburn, nausea, vomiting, abdominal pain, diarrhea, constipation and blood in stool.  Endocrine: Negative.   Genitourinary: Negative.   Musculoskeletal: Negative.  Negative for myalgias.  Skin: Negative.   Allergic/Immunologic: Negative.   Neurological: Negative.  Negative for headaches.  Hematological: Negative.  Negative for adenopathy.  Does not bruise/bleed easily.  Psychiatric/Behavioral: Negative.        Objective:   Physical Exam  Vitals reviewed. Constitutional: He is oriented to person, place, and time. He appears well-developed and well-nourished.  Non-toxic appearance. He does not have a sickly appearance. He does not appear ill. No distress.  HENT:  Head: Normocephalic and atraumatic.  Mouth/Throat: Oropharynx is clear and moist. No oropharyngeal exudate.  Eyes: Conjunctivae are normal. Right eye exhibits no discharge. Left eye exhibits no discharge. No scleral icterus.  Neck: Normal range of motion. Neck supple. No JVD present. No tracheal deviation present. No thyromegaly present.  Cardiovascular: Normal rate, regular rhythm, normal heart sounds and intact distal pulses.  Exam reveals no gallop and no friction rub.   No murmur heard. Pulmonary/Chest: Effort normal. No accessory muscle usage or stridor. Not tachypneic. No respiratory distress. He has no decreased breath sounds. He has wheezes in the right upper field, the right middle field, the right lower field, the left upper field, the left middle field and the left lower field. He has rhonchi. He has no rales.  He received an inhalation treatment with albuterol 5 mg and after that his lung exam shows a 50% decrease in the wheezing, rare rhonchi and good air movement.  Abdominal: Soft. Bowel sounds are normal. He exhibits no distension and no mass. There is no tenderness. There is no rebound and no guarding.  Musculoskeletal: Normal range of motion. He exhibits no edema and no tenderness.  Lymphadenopathy:    He has no cervical adenopathy.  Neurological: He is oriented to person, place, and time.  Skin: Skin is warm and dry. No rash noted. He is not diaphoretic. No erythema. No pallor.  Psychiatric: He has a normal mood and affect. His behavior is normal. Judgment and thought content normal.      Lab Results  Component Value Date   WBC 5.3 04/05/2013    HGB 13.4 04/05/2013   HCT 39.8 04/05/2013   PLT 191.0 04/05/2013   GLUCOSE 88 04/05/2013   CHOL 152 04/05/2013   TRIG 40.0 04/05/2013   HDL 40.60 04/05/2013   LDLCALC 103* 04/05/2013   ALT 18 04/05/2013   AST 21 04/05/2013   NA 138 04/05/2013   K 4.0 04/05/2013   CL 107 04/05/2013   CREATININE 0.8 04/05/2013   BUN 17 04/05/2013   CO2 26 04/05/2013   TSH 0.64 04/05/2013      Assessment & Plan:

## 2014-02-28 NOTE — Progress Notes (Signed)
Pre visit review using our clinic review tool, if applicable. No additional management support is needed unless otherwise documented below in the visit note. 

## 2014-02-28 NOTE — Assessment & Plan Note (Signed)
He was given a medrol dose pak earlier today but has now come in for a visit so I gave him am injection of depo-medrol IM and an albuterol inhalation treatment I have asked him to restart advair and proair and to use the nebulizer as well

## 2014-03-01 ENCOUNTER — Telehealth: Payer: Self-pay | Admitting: Internal Medicine

## 2014-03-01 NOTE — Telephone Encounter (Signed)
Relevant patient education mailed to patient.  

## 2014-03-03 ENCOUNTER — Telehealth: Payer: Self-pay | Admitting: Internal Medicine

## 2014-03-03 NOTE — Telephone Encounter (Signed)
Ok to take OTC 1000 units per day, as there is no evidence that high dose Vit D is helpful clinically for prevention such as hip fractures , even for older patients.  Vit D deficiency is a chronic diet and sun exposure issue that likely does not need aggressive therapy.  It is important to take the daiily Vit D above though and re-check in 6-12 months

## 2014-03-03 NOTE — Telephone Encounter (Signed)
[facility] stated that Vit D 50,0000 ergo is the only thing they have available and they dont have chole which was prscribed. They wont to know if you would like for them to change it.

## 2014-03-16 ENCOUNTER — Encounter (HOSPITAL_COMMUNITY): Payer: Self-pay | Admitting: Emergency Medicine

## 2014-03-16 ENCOUNTER — Emergency Department (INDEPENDENT_AMBULATORY_CARE_PROVIDER_SITE_OTHER)
Admission: EM | Admit: 2014-03-16 | Discharge: 2014-03-16 | Disposition: A | Discharge status: Home / Self Care | Payer: BC Managed Care – PPO | Source: Home / Self Care | Attending: Emergency Medicine | Admitting: Emergency Medicine

## 2014-03-16 DIAGNOSIS — Z23 Encounter for immunization: Secondary | ICD-10-CM

## 2014-03-16 DIAGNOSIS — IMO0002 Reserved for concepts with insufficient information to code with codable children: Secondary | ICD-10-CM

## 2014-03-16 DIAGNOSIS — T148XXA Other injury of unspecified body region, initial encounter: Secondary | ICD-10-CM

## 2014-03-16 DIAGNOSIS — Y92410 Unspecified street and highway as the place of occurrence of the external cause: Secondary | ICD-10-CM

## 2014-03-16 MED ORDER — TETANUS-DIPHTH-ACELL PERTUSSIS 5-2.5-18.5 LF-MCG/0.5 IM SUSP
INTRAMUSCULAR | Status: AC
Start: 2014-03-16 — End: 2014-03-16
  Filled 2014-03-16: qty 0.5

## 2014-03-16 MED ORDER — TETANUS-DIPHTH-ACELL PERTUSSIS 5-2.5-18.5 LF-MCG/0.5 IM SUSP
0.5000 mL | Freq: Once | INTRAMUSCULAR | Status: AC
  Administered 2014-03-16: 0.5 mL via INTRAMUSCULAR
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
## 2014-03-16 NOTE — Discharge Instructions (Signed)
Abrasion °An abrasion is a cut or scrape of the skin. Abrasions do not extend through all layers of the skin and most heal within 10 days. It is important to care for your abrasion properly to prevent infection. °CAUSES  °Most abrasions are caused by falling on, or gliding across, the ground or other surface. When your skin rubs on something, the outer and inner layer of skin rubs off, causing an abrasion. °DIAGNOSIS  °Your caregiver will be able to diagnose an abrasion during a physical exam.  °TREATMENT  °Your treatment depends on how large and deep the abrasion is. Generally, your abrasion will be cleaned with water and a mild soap to remove any dirt or debris. An antibiotic ointment may be put over the abrasion to prevent an infection. A bandage (dressing) may be wrapped around the abrasion to keep it from getting dirty.  °You may need a tetanus shot if: °· You cannot remember when you had your last tetanus shot. °· You have never had a tetanus shot. °· The injury broke your skin. °If you get a tetanus shot, your arm may swell, get red, and feel warm to the touch. This is common and not a problem. If you need a tetanus shot and you choose not to have one, there is a rare chance of getting tetanus. Sickness from tetanus can be serious.  °HOME CARE INSTRUCTIONS  °· If a dressing was applied, change it at least once a day or as directed by your caregiver. If the bandage sticks, soak it off with warm water.   °· Wash the area with water and a mild soap to remove all the ointment 2 times a day. Rinse off the soap and pat the area dry with a clean towel.   °· Reapply any ointment as directed by your caregiver. This will help prevent infection and keep the bandage from sticking. Use gauze over the wound and under the dressing to help keep the bandage from sticking.   °· Change your dressing right away if it becomes wet or dirty.   °· Only take over-the-counter or prescription medicines for pain, discomfort, or fever as  directed by your caregiver.   °· Follow up with your caregiver within 24 48 hours for a wound check, or as directed. If you were not given a wound-check appointment, look closely at your abrasion for redness, swelling, or pus. These are signs of infection. °SEEK IMMEDIATE MEDICAL CARE IF:  °· You have increasing pain in the wound.   °· You have redness, swelling, or tenderness around the wound.   °· You have pus coming from the wound.   °· You have a fever or persistent symptoms for more than 2 3 days. °· You have a fever and your symptoms suddenly get worse. °· You have a bad smell coming from the wound or dressing.   °MAKE SURE YOU:  °· Understand these instructions. °· Will watch your condition. °· Will get help right away if you are not doing well or get worse. °Document Released: 07/31/2005 Document Revised: 10/07/2012 Document Reviewed: 09/24/2011 °[employer]® Patient Information ©2014 [employer]. ° °

## 2014-03-19 NOTE — ED Provider Notes (Signed)
Medical screening examination/treatment/procedure(s) were performed by non-physician practitioner and as supervising physician I was immediately available for consultation/collaboration.  Sarp Vernier, M.D.  Aanika Defoor C Biviana Saddler, MD 03/19/14 0819 

## 2014-03-21 ENCOUNTER — Encounter: Payer: Self-pay | Admitting: Internal Medicine

## 2014-03-21 ENCOUNTER — Ambulatory Visit (INDEPENDENT_AMBULATORY_CARE_PROVIDER_SITE_OTHER): Payer: BC Managed Care – PPO | Admitting: Internal Medicine

## 2014-03-21 VITALS — BP 138/82 | HR 63 | Temp 97.1°F | Resp 16 | Ht 67.0 in | Wt 227.5 lb

## 2014-03-21 DIAGNOSIS — J45909 Unspecified asthma, uncomplicated: Secondary | ICD-10-CM

## 2014-03-21 DIAGNOSIS — J4530 Mild persistent asthma, uncomplicated: Secondary | ICD-10-CM

## 2014-03-21 NOTE — Progress Notes (Signed)
Subjective:    Patient ID: Luke GlassingChristopher L Barret, male    DOB: 1986/09/30, 28 y.o.   MRN: [id number]  Asthma There is no chest tightness, cough, difficulty breathing, frequent throat clearing, hemoptysis, hoarse voice, shortness of breath, sputum production or wheezing. This is a chronic problem. The problem occurs rarely. The problem has been resolved. Pertinent negatives include no appetite change, chest pain, dyspnea on exertion, ear congestion, ear pain, fever, headaches, heartburn, malaise/fatigue, myalgias, nasal congestion, orthopnea, PND, postnasal drip, rhinorrhea, sneezing, sore throat, sweats, trouble swallowing or weight loss. His symptoms are aggravated by nothing. His symptoms are alleviated by beta-agonist and steroid inhaler. He reports complete improvement on treatment. His past medical history is significant for asthma.      Review of Systems  Constitutional: Negative.  Negative for fever, weight loss, malaise/fatigue and appetite change.  HENT: Negative.  Negative for ear pain, hoarse voice, postnasal drip, rhinorrhea, sneezing, sore throat and trouble swallowing.   Eyes: Negative.   Respiratory: Negative.  Negative for cough, hemoptysis, sputum production, shortness of breath and wheezing.   Cardiovascular: Negative.  Negative for chest pain, dyspnea on exertion, palpitations, leg swelling and PND.  Gastrointestinal: Negative.  Negative for heartburn, nausea, vomiting, abdominal pain, diarrhea, constipation and blood in stool.  Endocrine: Negative.   Genitourinary: Negative.   Musculoskeletal: Negative.  Negative for arthralgias, back pain and myalgias.  Skin: Negative.   Allergic/Immunologic: Negative.   Neurological: Negative.  Negative for dizziness, tremors, weakness, light-headedness and headaches.  Hematological: Negative.  Does not bruise/bleed easily.  Psychiatric/Behavioral: Negative.        Objective:   Physical Exam  Vitals reviewed. Constitutional: He  is oriented to person, place, and time. He appears well-developed and well-nourished. No distress.  HENT:  Head: Normocephalic and atraumatic.  Mouth/Throat: Oropharynx is clear and moist. No oropharyngeal exudate.  Eyes: Conjunctivae are normal. Right eye exhibits no discharge. Left eye exhibits no discharge. No scleral icterus.  Neck: Normal range of motion. Neck supple. No JVD present. No tracheal deviation present. No thyromegaly present.  Cardiovascular: Normal rate, regular rhythm, normal heart sounds and intact distal pulses.  Exam reveals no gallop and no friction rub.   No murmur heard. Pulmonary/Chest: Effort normal and breath sounds normal. No stridor. No respiratory distress. He has no wheezes. He has no rales. He exhibits no tenderness.  Abdominal: Soft. Bowel sounds are normal. He exhibits no distension and no mass. There is no tenderness. There is no rebound and no guarding.  Musculoskeletal: Normal range of motion. He exhibits no edema and no tenderness.  Lymphadenopathy:    He has no cervical adenopathy.  Neurological: He is oriented to person, place, and time.  Skin: Skin is warm and dry. No rash noted. He is not diaphoretic. No erythema. No pallor.  Psychiatric: He has a normal mood and affect. His behavior is normal. Judgment and thought content normal.     Lab Results  Component Value Date   WBC 5.3 04/05/2013   HGB 13.4 04/05/2013   HCT 39.8 04/05/2013   PLT 191.0 04/05/2013   GLUCOSE 88 04/05/2013   CHOL 152 04/05/2013   TRIG 40.0 04/05/2013   HDL 40.60 04/05/2013   LDLCALC 103* 04/05/2013   ALT 18 04/05/2013   AST 21 04/05/2013   NA 138 04/05/2013   K 4.0 04/05/2013   CL 107 04/05/2013   CREATININE 0.8 04/05/2013   BUN 17 04/05/2013   CO2 26 04/05/2013   TSH 0.64 04/05/2013  Assessment & Plan:

## 2014-03-21 NOTE — Patient Instructions (Signed)
Asthma, Adult Asthma is a recurring condition in which the airways tighten and narrow. Asthma can make it difficult to breathe. It can cause coughing, wheezing, and shortness of breath. Asthma episodes (also called asthma attacks) range from minor to life-threatening. Asthma cannot be cured, but medicines and lifestyle changes can help control it. CAUSES Asthma is believed to be caused by inherited (genetic) and environmental factors, but its exact cause is unknown. Asthma may be triggered by allergens, lung infections, or irritants in the air. Asthma triggers are different for each person. Common triggers include:   Animal dander.  Dust mites.  Cockroaches.  Pollen from trees or grass.  Mold.  Smoke.  Air pollutants such as dust, household cleaners, hair sprays, aerosol sprays, paint fumes, strong chemicals, or strong odors.  Cold air, weather changes, and winds (which increase molds and pollens in the air).  Strong emotional expressions such as crying or laughing hard.  Stress.  Certain medicines (such as aspirin) or types of drugs (such as beta-blockers).  Sulfites in foods and drinks. Foods and drinks that may contain sulfites include dried fruit, potato chips, and sparkling grape juice.  Infections or inflammatory conditions such as the flu, a cold, or an inflammation of the nasal membranes (rhinitis).  Gastroesophageal reflux disease (GERD).  Exercise or strenuous activity. SYMPTOMS Symptoms may occur immediately after asthma is triggered or many hours later. Symptoms include:  Wheezing.  Excessive nighttime or early morning coughing.  Frequent or severe coughing with a common cold.  Chest tightness.  Shortness of breath. DIAGNOSIS  The diagnosis of asthma is made by a review of your medical history and a physical exam. Tests may also be performed. These may include:  Lung function studies. These tests show how much air you breath in and out.  Allergy  tests.  Imaging tests such as X-rays. TREATMENT  Asthma cannot be cured, but it can usually be controlled. Treatment involves identifying and avoiding your asthma triggers. It also involves medicines. There are 2 classes of medicine used for asthma treatment:   Controller medicines. These prevent asthma symptoms from occurring. They are usually taken every day.  Reliever or rescue medicines. These quickly relieve asthma symptoms. They are used as needed and provide short-term relief. Your health care provider will help you create an asthma action plan. An asthma action plan is a written plan for managing and treating your asthma attacks. It includes a list of your asthma triggers and how they may be avoided. It also includes information on when medicines should be taken and when their dosage should be changed. An action plan may also involve the use of a device called a peak flow meter. A peak flow meter measures how well the lungs are working. It helps you monitor your condition. HOME CARE INSTRUCTIONS   Take medicine as directed by your health care provider. Speak with your health care provider if you have questions about how or when to take the medicines.  Use a peak flow meter as directed by your health care provider. Record and keep track of readings.  Understand and use the action plan to help minimize or stop an asthma attack without needing to seek medical care.  Control your home environment in the following ways to help prevent asthma attacks:  Do not smoke. Avoid being exposed to secondhand smoke.  Change your heating and air conditioning filter regularly.  Limit your use of fireplaces and wood stoves.  Get rid of pests (such as roaches and   mice) and their droppings.  Throw away plants if you see mold on them.  Clean your floors and dust regularly. Use unscented cleaning products.  Try to have someone else vacuum for you regularly. Stay out of rooms while they are being  vacuumed and for a short while afterward. If you vacuum, use a dust mask from a hardware store, a double-layered or microfilter vacuum cleaner bag, or a vacuum cleaner with a HEPA filter.  Replace carpet with wood, tile, or vinyl flooring. Carpet can trap dander and dust.  Use allergy-proof pillows, mattress covers, and box spring covers.  Wash bed sheets and blankets every week in hot water and dry them in a dryer.  Use blankets that are made of polyester or cotton.  Clean bathrooms and kitchens with bleach. If possible, have someone repaint the walls in these rooms with mold-resistant paint. Keep out of the rooms that are being cleaned and painted.  Wash hands frequently. SEEK MEDICAL CARE IF:   You have wheezing, shortness of breath, or a cough even if taking medicine to prevent attacks.  The colored mucus you cough up (sputum) is thicker than usual.  Your sputum changes from clear or white to yellow, green, gray, or bloody.  You have any problems that may be related to the medicines you are taking (such as a rash, itching, swelling, or trouble breathing).  You are using a reliever medicine more than 2 3 times per week.  Your peak flow is still at 50 79% of you personal best after following your action plan for 1 hour. SEEK IMMEDIATE MEDICAL CARE IF:   You seem to be getting worse and are unresponsive to treatment during an asthma attack.  You are short of breath even at rest.  You get short of breath when doing very little physical activity.  You have difficulty eating, drinking, or talking due to asthma symptoms.  You develop chest pain.  You develop a fast heartbeat.  You have a bluish color to your lips or fingernails.  You are lightheaded, dizzy, or faint.  Your peak flow is less than 50% of your personal best.  You have a fever or persistent symptoms for more than 2 3 days.  You have a fever and symptoms suddenly get worse. MAKE SURE YOU:   Understand these  instructions.  Will watch your condition.  Will get help right away if you are not doing well or get worse. Document Released: 10/21/2005 Document Revised: 06/23/2013 Document Reviewed: 05/20/2013 [employer] Patient Information 2014 [employer].  

## 2014-03-21 NOTE — Progress Notes (Signed)
Pre visit review using our clinic review tool, if applicable. No additional management support is needed unless otherwise documented below in the visit note. 

## 2014-03-23 NOTE — Assessment & Plan Note (Signed)
His symptoms are well controlled 

## 2014-04-13 ENCOUNTER — Ambulatory Visit (INDEPENDENT_AMBULATORY_CARE_PROVIDER_SITE_OTHER): Payer: BC Managed Care – PPO | Admitting: Internal Medicine

## 2014-04-13 ENCOUNTER — Ambulatory Visit (INDEPENDENT_AMBULATORY_CARE_PROVIDER_SITE_OTHER)
Admission: RE | Admit: 2014-04-13 | Discharge: 2014-04-13 | Disposition: A | Discharge status: Home / Self Care | Payer: BC Managed Care – PPO | Source: Ambulatory Visit | Attending: Internal Medicine | Admitting: Internal Medicine

## 2014-04-13 ENCOUNTER — Encounter: Payer: Self-pay | Admitting: Internal Medicine

## 2014-04-13 VITALS — BP 142/64 | HR 98 | Temp 98.1°F | Resp 16 | Ht 67.0 in | Wt 232.0 lb

## 2014-04-13 DIAGNOSIS — S199XXA Unspecified injury of neck, initial encounter: Secondary | ICD-10-CM

## 2014-04-13 DIAGNOSIS — S0992XA Unspecified injury of nose, initial encounter: Secondary | ICD-10-CM | POA: Insufficient documentation

## 2014-04-13 DIAGNOSIS — S0993XA Unspecified injury of face, initial encounter: Secondary | ICD-10-CM

## 2014-04-13 NOTE — Progress Notes (Signed)
Pre visit review using our clinic review tool, if applicable. No additional management support is needed unless otherwise documented below in the visit note. 

## 2014-04-13 NOTE — Assessment & Plan Note (Signed)
Exam and Xray are normal This is a mild contusion He does not want anything for pain

## 2014-04-13 NOTE — Patient Instructions (Signed)
Nasal Fracture °A nasal fracture is a break or crack in the bones of the nose. A minor break usually heals in a month. You often will receive black eyes from a nasal fracture. This is not a cause for concern. The black eyes will go away over 1 to 2 weeks.  °DIAGNOSIS  °Your caregiver may want to examine you if you are concerned about a fracture of the nose. X-rays of the nose may not show a nasal fracture even when one is present. Sometimes your caregiver must wait 1 to 5 days after the injury to re-check the nose for alignment and to take additional X-rays. Sometimes the caregiver must wait until the swelling has gone down. °TREATMENT °Minor fractures that have caused no deformity often do not require treatment. More serious fractures where bones are displaced may require surgery. This will take place after the swelling is gone. Surgery will stabilize and align the fracture. °HOME CARE INSTRUCTIONS  °· Put ice on the injured area. °· Put ice in a plastic bag. °· Place a towel between your skin and the bag. °· Leave the ice on for 15-20 minutes, 03-04 times a day. °· Take medications as directed by your caregiver. °· Only take over-the-counter or prescription medicines for pain, discomfort, or fever as directed by your caregiver. °· If your nose starts bleeding, squeeze the soft parts of the nose against the center wall while you are sitting in an upright position for 10 minutes. °· Contact sports should be avoided for at least 3 to 4 weeks or as directed by your caregiver. °SEEK MEDICAL CARE IF: °· Your pain increases or becomes severe. °· You continue to have nosebleeds. °· The shape of your nose does not return to normal within 5 days. °· You have pus draining from the nose. °SEEK IMMEDIATE MEDICAL CARE IF:  °· You have bleeding from your nose that does not stop after 20 minutes of pinching the nostrils closed and keeping ice on the nose. °· You have clear fluid draining from your nose. °· You notice a grape-like  swelling on the dividing wall between the nostrils (septum). This is a collection of blood (hematoma) that must be drained to help prevent infection. °· You have difficulty moving your eyes. °· You have recurrent vomiting. °Document Released: 10/18/2000 Document Revised: 01/13/2012 Document Reviewed: 02/04/2011 °[employer]® Patient Information ©2014 [employer]. ° °

## 2014-04-13 NOTE — Progress Notes (Signed)
   Subjective:    Patient ID: Luke Gross, male    DOB: September 30, 1986, 28 y.o.   MRN: [id number]  Facial Injury  Incident onset: 10 days ago. The injury mechanism was a direct blow (accidental elbow to the nose while playing [employer]). There was no loss of consciousness. There was no blood loss. The quality of the pain is described as aching. The pain is at a severity of 1/10. The pain is mild. The pain has been improving since the injury. Pertinent negatives include no blurred vision, disorientation, headaches, memory loss, numbness, tinnitus, vomiting or weakness. He has tried nothing for the symptoms.      Review of Systems  HENT: Negative for tinnitus.   Eyes: Negative for blurred vision.  Gastrointestinal: Negative for vomiting.  Neurological: Negative for weakness, numbness and headaches.  Psychiatric/Behavioral: Negative for memory loss.  All other systems reviewed and are negative.      Objective:   Physical Exam  Vitals reviewed. Constitutional: He is oriented to person, place, and time. He appears well-developed and well-nourished. No distress.  HENT:  Head: Normocephalic and atraumatic.  Right Ear: Hearing, tympanic membrane, external ear and ear canal normal.  Nose: Nose normal. No mucosal edema, rhinorrhea, nose lacerations, sinus tenderness, nasal deformity, septal deviation or nasal septal hematoma. No epistaxis.  No foreign bodies. Right sinus exhibits no maxillary sinus tenderness and no frontal sinus tenderness. Left sinus exhibits no maxillary sinus tenderness and no frontal sinus tenderness.  Mouth/Throat: Oropharynx is clear and moist. No oropharyngeal exudate.  Eyes: Conjunctivae and EOM are normal. Pupils are equal, round, and reactive to light. Right eye exhibits no discharge. Left eye exhibits no discharge. No scleral icterus.  Neck: Normal range of motion. Neck supple. No JVD present. No tracheal deviation present. No thyromegaly present.  Cardiovascular:  Normal rate, regular rhythm, normal heart sounds and intact distal pulses.  Exam reveals no gallop and no friction rub.   No murmur heard. Pulmonary/Chest: Effort normal and breath sounds normal. No stridor. No respiratory distress. He has no wheezes. He has no rales. He exhibits no tenderness.  Abdominal: Soft. Bowel sounds are normal. He exhibits no distension and no mass. There is no tenderness. There is no rebound and no guarding.  Musculoskeletal: Normal range of motion. He exhibits no edema and no tenderness.  Lymphadenopathy:    He has no cervical adenopathy.  Neurological: He is alert and oriented to person, place, and time.  Skin: Skin is warm and dry. No rash noted. He is not diaphoretic. No erythema. No pallor.  Psychiatric: He has a normal mood and affect. His behavior is normal. Judgment and thought content normal.          Assessment & Plan:

## 2014-06-29 ENCOUNTER — Other Ambulatory Visit: Payer: Self-pay | Admitting: Internal Medicine

## 2014-07-26 ENCOUNTER — Other Ambulatory Visit: Payer: Self-pay | Admitting: Internal Medicine

## 2014-08-01 ENCOUNTER — Encounter: Payer: Self-pay | Admitting: Internal Medicine

## 2014-08-01 ENCOUNTER — Ambulatory Visit (INDEPENDENT_AMBULATORY_CARE_PROVIDER_SITE_OTHER): Payer: BC Managed Care – PPO | Admitting: Internal Medicine

## 2014-08-01 VITALS — BP 140/92 | HR 60 | Temp 98.3°F | Resp 16 | Ht 67.0 in | Wt 228.0 lb

## 2014-08-01 DIAGNOSIS — J309 Allergic rhinitis, unspecified: Secondary | ICD-10-CM

## 2014-08-01 DIAGNOSIS — J4531 Mild persistent asthma with (acute) exacerbation: Secondary | ICD-10-CM

## 2014-08-01 DIAGNOSIS — J45901 Unspecified asthma with (acute) exacerbation: Secondary | ICD-10-CM

## 2014-08-01 DIAGNOSIS — I10 Essential (primary) hypertension: Secondary | ICD-10-CM

## 2014-08-01 DIAGNOSIS — J4541 Moderate persistent asthma with (acute) exacerbation: Secondary | ICD-10-CM

## 2014-08-01 MED ORDER — FLUTICASONE FUROATE-VILANTEROL 200-25 MCG/INH IN AEPB: 1.0000 | INHALATION_SPRAY | Freq: Every day | RESPIRATORY_TRACT | Status: DC

## 2014-08-01 MED ORDER — ALBUTEROL SULFATE 108 (90 BASE) MCG/ACT IN AEPB: 1.0000 | INHALATION_SPRAY | Freq: Four times a day (QID) | RESPIRATORY_TRACT | Status: DC | PRN

## 2014-08-01 MED ORDER — METHYLPREDNISOLONE ACETATE 80 MG/ML IJ SUSP
120.0000 mg | Freq: Once | INTRAMUSCULAR | Status: AC
  Administered 2014-08-01: 120 mg via INTRAMUSCULAR

## 2014-08-01 NOTE — Assessment & Plan Note (Signed)
His BP is not very well controlled He will work on his lifestyle modifications

## 2014-08-01 NOTE — Assessment & Plan Note (Signed)
He ran out of advair and is having a flare of asthma I gave him samples of Breo and showed him how to use it - he demonstrated proficiency with its use He will also use albuterol I gave him an injection of depo-medrol IM to reduce the wheezing and inflammation

## 2014-08-01 NOTE — Assessment & Plan Note (Signed)
Will change to advair to [facility]

## 2014-08-01 NOTE — Progress Notes (Signed)
Pre visit review using our clinic review tool, if applicable. No additional management support is needed unless otherwise documented below in the visit note. 

## 2014-08-01 NOTE — Progress Notes (Signed)
Subjective:    Patient ID: Luke Gross, male    DOB: Jun 30, 1986, 28 y.o.   MRN: [id number]  Asthma He complains of chest tightness, shortness of breath and wheezing. There is no cough, difficulty breathing, frequent throat clearing, hemoptysis, hoarse voice or sputum production. This is a recurrent problem. The current episode started in the past 7 days. The problem occurs constantly. The problem has been unchanged. Associated symptoms include postnasal drip and rhinorrhea. Pertinent negatives include no appetite change, chest pain, dyspnea on exertion, ear congestion, ear pain, fever, headaches, heartburn, malaise/fatigue, myalgias, nasal congestion, orthopnea, PND, sneezing, sore throat, sweats, trouble swallowing or weight loss. His symptoms are alleviated by beta-agonist. He reports moderate improvement on treatment. His past medical history is significant for asthma. There is no history of bronchiectasis, bronchitis, COPD, emphysema or pneumonia.      Review of Systems  Constitutional: Negative.  Negative for fever, chills, weight loss, malaise/fatigue, diaphoresis, activity change, appetite change, fatigue and unexpected weight change.  HENT: Positive for congestion, postnasal drip and rhinorrhea. Negative for ear pain, hoarse voice, sinus pressure, sneezing, sore throat, tinnitus, trouble swallowing and voice change.   Eyes: Negative.   Respiratory: Positive for shortness of breath and wheezing. Negative for apnea, cough, hemoptysis, sputum production, choking, chest tightness and stridor.   Cardiovascular: Negative.  Negative for chest pain, dyspnea on exertion, palpitations, leg swelling and PND.  Gastrointestinal: Negative.  Negative for heartburn and abdominal pain.  Endocrine: Negative.   Genitourinary: Negative.   Musculoskeletal: Negative.  Negative for arthralgias, back pain, myalgias and neck pain.  Skin: Negative for color change and rash.  Allergic/Immunologic:  Negative.   Neurological: Negative.  Negative for headaches.  Hematological: Negative.  Negative for adenopathy. Does not bruise/bleed easily.  Psychiatric/Behavioral: Negative.        Objective:   Physical Exam  Vitals reviewed. Constitutional: He is oriented to person, place, and time. He appears well-developed and well-nourished.  Non-toxic appearance. He does not have a sickly appearance. He does not appear ill. No distress.  HENT:  Head: Normocephalic and atraumatic.  Mouth/Throat: Oropharynx is clear and moist. No oropharyngeal exudate.  Eyes: Conjunctivae are normal. Right eye exhibits no discharge. Left eye exhibits no discharge. No scleral icterus.  Neck: Normal range of motion. Neck supple. No JVD present. No tracheal deviation present. No thyromegaly present.  Cardiovascular: Normal rate, regular rhythm, normal heart sounds and intact distal pulses.  Exam reveals no gallop and no friction rub.   No murmur heard. Pulmonary/Chest: Effort normal. No accessory muscle usage or stridor. Not tachypneic. No respiratory distress. He has no decreased breath sounds. He has wheezes in the right upper field, the right middle field, the right lower field, the left upper field, the left middle field and the left lower field. He has no rhonchi. He has no rales. He exhibits no tenderness.  Mild diffuse insp and exp wheezes with good air movement  Abdominal: Soft. Bowel sounds are normal. He exhibits no distension and no mass. There is no tenderness. There is no rebound and no guarding.  Musculoskeletal: Normal range of motion. He exhibits no edema and no tenderness.  Lymphadenopathy:    He has no cervical adenopathy.  Neurological: He is oriented to person, place, and time.  Skin: Skin is warm and dry. No rash noted. He is not diaphoretic. No erythema. No pallor.  Psychiatric: He has a normal mood and affect. His behavior is normal. Judgment and thought content normal.  Lab Results    Component Value Date   WBC 5.3 04/05/2013   HGB 13.4 04/05/2013   HCT 39.8 04/05/2013   PLT 191.0 04/05/2013   GLUCOSE 88 04/05/2013   CHOL 152 04/05/2013   TRIG 40.0 04/05/2013   HDL 40.60 04/05/2013   LDLCALC 103* 04/05/2013   ALT 18 04/05/2013   AST 21 04/05/2013   NA 138 04/05/2013   K 4.0 04/05/2013   CL 107 04/05/2013   CREATININE 0.8 04/05/2013   BUN 17 04/05/2013   CO2 26 04/05/2013   TSH 0.64 04/05/2013       Assessment & Plan:

## 2014-08-01 NOTE — Patient Instructions (Signed)

## 2014-08-07 ENCOUNTER — Other Ambulatory Visit: Payer: Self-pay | Admitting: Internal Medicine

## 2014-08-22 ENCOUNTER — Encounter: Payer: Self-pay | Admitting: Internal Medicine

## 2014-08-22 ENCOUNTER — Ambulatory Visit (INDEPENDENT_AMBULATORY_CARE_PROVIDER_SITE_OTHER): Payer: BC Managed Care – PPO | Admitting: Internal Medicine

## 2014-08-22 VITALS — BP 118/78 | HR 60 | Temp 98.6°F | Resp 16 | Ht 67.0 in | Wt 230.0 lb

## 2014-08-22 DIAGNOSIS — I10 Essential (primary) hypertension: Secondary | ICD-10-CM

## 2014-08-22 DIAGNOSIS — J4530 Mild persistent asthma, uncomplicated: Secondary | ICD-10-CM

## 2014-08-22 DIAGNOSIS — E559 Vitamin D deficiency, unspecified: Secondary | ICD-10-CM

## 2014-08-22 NOTE — Progress Notes (Signed)
   Subjective:    Patient ID: Luke GlassingChristopher L Lupinacci, male    DOB: 07/04/1986, 28 y.o.   MRN: [id number]  Hypertension This is a chronic problem. The current episode started more than 1 year ago. The problem has been gradually improving since onset. The problem is controlled. Pertinent negatives include no anxiety, blurred vision, chest pain, headaches, malaise/fatigue, neck pain, orthopnea, palpitations, peripheral edema, PND, shortness of breath or sweats. There are no associated agents to hypertension. Past treatments include ACE inhibitors. The current treatment provides significant improvement. There are no compliance problems.       Review of Systems  Constitutional: Negative.  Negative for fever, chills, malaise/fatigue, diaphoresis, appetite change and fatigue.  HENT: Negative.   Eyes: Negative.  Negative for blurred vision.  Respiratory: Negative.  Negative for choking, chest tightness, shortness of breath and stridor.   Cardiovascular: Negative.  Negative for chest pain, palpitations, orthopnea, leg swelling and PND.  Gastrointestinal: Negative.  Negative for abdominal pain.  Endocrine: Negative.   Genitourinary: Negative.   Musculoskeletal: Negative.  Negative for neck pain.  Skin: Negative.   Allergic/Immunologic: Negative.   Neurological: Negative.  Negative for headaches.  Hematological: Negative.  Negative for adenopathy.  Psychiatric/Behavioral: Negative.        Objective:   Physical Exam  Vitals reviewed. Constitutional: He is oriented to person, place, and time. He appears well-developed and well-nourished.  Non-toxic appearance. He does not have a sickly appearance. He does not appear ill. No distress.  HENT:  Head: Normocephalic and atraumatic.  Mouth/Throat: Oropharynx is clear and moist. No oropharyngeal exudate.  Eyes: Conjunctivae are normal. Right eye exhibits no discharge. Left eye exhibits no discharge. No scleral icterus.  Neck: Normal range of motion.  Neck supple. No JVD present. No tracheal deviation present. No thyromegaly present.  Cardiovascular: Normal rate, regular rhythm, normal heart sounds and intact distal pulses.  Exam reveals no gallop and no friction rub.   No murmur heard. Pulmonary/Chest: Effort normal and breath sounds normal. No stridor. No respiratory distress. He has no wheezes. He has no rales. He exhibits no tenderness.  Abdominal: Soft. Bowel sounds are normal. He exhibits no distension and no mass. There is no tenderness. There is no rebound and no guarding.  Musculoskeletal: Normal range of motion. He exhibits no edema and no tenderness.  Lymphadenopathy:    He has no cervical adenopathy.  Neurological: He is oriented to person, place, and time.  Skin: Skin is warm and dry. No rash noted. He is not diaphoretic. No erythema. No pallor.     Lab Results  Component Value Date   WBC 5.3 04/05/2013   HGB 13.4 04/05/2013   HCT 39.8 04/05/2013   PLT 191.0 04/05/2013   GLUCOSE 88 04/05/2013   CHOL 152 04/05/2013   TRIG 40.0 04/05/2013   HDL 40.60 04/05/2013   LDLCALC 103* 04/05/2013   ALT 18 04/05/2013   AST 21 04/05/2013   NA 138 04/05/2013   K 4.0 04/05/2013   CL 107 04/05/2013   CREATININE 0.8 04/05/2013   BUN 17 04/05/2013   CO2 26 04/05/2013   TSH 0.64 04/05/2013       Assessment & Plan:

## 2014-08-22 NOTE — Patient Instructions (Signed)

## 2014-08-22 NOTE — Progress Notes (Signed)
Pre visit review using our clinic review tool, if applicable. No additional management support is needed unless otherwise documented below in the visit note. 

## 2014-08-23 NOTE — Assessment & Plan Note (Signed)
He has stopped the ACEI and his BP is still well controlled He will cont lifestyle modifications Will recheck his BP next visit

## 2014-08-23 NOTE — Assessment & Plan Note (Signed)
[facility]

## 2015-02-21 ENCOUNTER — Encounter: Payer: Self-pay | Admitting: Internal Medicine

## 2015-02-21 ENCOUNTER — Ambulatory Visit (INDEPENDENT_AMBULATORY_CARE_PROVIDER_SITE_OTHER): Payer: 59 | Admitting: Internal Medicine

## 2015-02-21 ENCOUNTER — Other Ambulatory Visit (INDEPENDENT_AMBULATORY_CARE_PROVIDER_SITE_OTHER): Payer: 59

## 2015-02-21 VITALS — BP 130/84 | HR 51 | Temp 97.8°F | Resp 16 | Wt 235.0 lb

## 2015-02-21 DIAGNOSIS — J4530 Mild persistent asthma, uncomplicated: Secondary | ICD-10-CM | POA: Diagnosis not present

## 2015-02-21 DIAGNOSIS — Z0000 Encounter for general adult medical examination without abnormal findings: Secondary | ICD-10-CM | POA: Diagnosis not present

## 2015-02-21 DIAGNOSIS — L280 Lichen simplex chronicus: Secondary | ICD-10-CM | POA: Diagnosis not present

## 2015-02-21 LAB — COMPREHENSIVE METABOLIC PANEL
ALT: 17 U/L (ref 0–53)
AST: 21 U/L (ref 0–37)
Albumin: 4.3 g/dL (ref 3.5–5.2)
Alkaline Phosphatase: 42 U/L (ref 39–117)
BUN: 25 mg/dL — ABNORMAL HIGH (ref 6–23)
CALCIUM: 9.7 mg/dL (ref 8.4–10.5)
CHLORIDE: 107 meq/L (ref 96–112)
CO2: 24 mEq/L (ref 19–32)
Creatinine, Ser: 0.77 mg/dL (ref 0.40–1.50)
GFR: 154.26 mL/min (ref >60.00)
Glucose, Bld: 95 mg/dL (ref 70–99)
POTASSIUM: 3.9 meq/L (ref 3.5–5.1)
SODIUM: 137 meq/L (ref 135–145)
Total Bilirubin: 0.4 mg/dL (ref 0.2–1.2)
Total Protein: 7.2 g/dL (ref 6.0–8.3)

## 2015-02-21 LAB — CBC WITH DIFFERENTIAL/PLATELET
BASOS ABS: 0 10*3/uL (ref 0.0–0.1)
Basophils Relative: 0.4 % (ref 0.0–3.0)
EOS ABS: 0.6 10*3/uL (ref 0.0–0.7)
Eosinophils Relative: 11.5 % — ABNORMAL HIGH (ref 0.0–5.0)
HCT: 42.4 % (ref 39.0–52.0)
HEMOGLOBIN: 13.8 g/dL (ref 13.0–17.0)
LYMPHS ABS: 1.5 10*3/uL (ref 0.7–4.0)
LYMPHS PCT: 28.7 % (ref 12.0–46.0)
MCHC: 32.5 g/dL (ref 30.0–36.0)
MCV: 87.1 fl (ref 78.0–100.0)
Monocytes Absolute: 0.8 10*3/uL (ref 0.1–1.0)
Monocytes Relative: 14.4 % — ABNORMAL HIGH (ref 3.0–12.0)
NEUTROS ABS: 2.4 10*3/uL (ref 1.4–7.7)
Neutrophils Relative %: 45 % (ref 43.0–77.0)
PLATELETS: 211 10*3/uL (ref 150.0–400.0)
RBC: 4.87 Mil/uL (ref 4.22–5.81)
RDW: 13 % (ref 11.5–15.5)
WBC: 5.3 10*3/uL (ref 4.0–10.5)

## 2015-02-21 LAB — URINALYSIS, ROUTINE W REFLEX MICROSCOPIC
Bilirubin Urine: NEGATIVE
HGB URINE DIPSTICK: NEGATIVE
KETONES UR: NEGATIVE
Leukocytes, UA: NEGATIVE
Nitrite: NEGATIVE
RBC / HPF: NONE SEEN (ref 0–?)
Specific Gravity, Urine: >=1.03 — AB (ref 1.000–1.030)
Total Protein, Urine: NEGATIVE
UROBILINOGEN UA: 0.2 (ref 0.0–1.0)
Urine Glucose: NEGATIVE
pH: 6 (ref 5.0–8.0)

## 2015-02-21 LAB — LIPID PANEL
CHOL/HDL RATIO: 4
Cholesterol: 150 mg/dL (ref 0–200)
HDL: 39.6 mg/dL (ref >39.00)
LDL Cholesterol: 97 mg/dL (ref 0–99)
NONHDL: 110.4
Triglycerides: 65 mg/dL (ref 0.0–149.0)
VLDL: 13 mg/dL (ref 0.0–40.0)

## 2015-02-21 LAB — TSH: TSH: 0.75 u[IU]/mL (ref 0.35–4.50)

## 2015-02-21 MED ORDER — FLUOCINONIDE-E 0.05 % EX CREA: 1.0000 "application " | TOPICAL_CREAM | Freq: Two times a day (BID) | CUTANEOUS | Status: DC

## 2015-02-21 MED ORDER — DOXEPIN HCL 10 MG PO CAPS: 10.0000 mg | ORAL_CAPSULE | Freq: Every day | ORAL | Status: DC

## 2015-02-21 MED ORDER — ALBUTEROL SULFATE HFA 108 (90 BASE) MCG/ACT IN AERS: 2.0000 | INHALATION_SPRAY | Freq: Four times a day (QID) | RESPIRATORY_TRACT | Status: DC | PRN

## 2015-02-21 NOTE — Assessment & Plan Note (Signed)
He complains of nocturnal itching so will start doxepin at HS Will start high potency topical steroid for several weeks then will back down to low potency steroid for long term management

## 2015-02-21 NOTE — Assessment & Plan Note (Signed)
Asthma is well controlled on the current inhalers

## 2015-02-21 NOTE — Progress Notes (Signed)
Pre visit review using our clinic review tool, if applicable. No additional management support is needed unless otherwise documented below in the visit note. 

## 2015-02-21 NOTE — Assessment & Plan Note (Signed)
Vaccines were reviewed and updated Exam done He requests an STI screen Labs ordered Pt ed material was given

## 2015-02-21 NOTE — Patient Instructions (Addendum)
Health Maintenance A healthy lifestyle and preventative care can promote health and wellness.  Maintain regular health, dental, and eye exams.  Eat a healthy diet. Foods like vegetables, fruits, whole grains, low-fat dairy products, and lean protein foods contain the nutrients you need and are low in calories. Decrease your intake of foods high in solid fats, added sugars, and salt. Get information about a proper diet from your health care provider, if necessary.  Regular physical exercise is one of the most important things you can do for your health. Most adults should get at least 150 minutes of moderate-intensity exercise (any activity that increases your heart rate and causes you to sweat) each week. In addition, most adults need muscle-strengthening exercises on 2 or more days a week.   Maintain a healthy weight. The body mass index (BMI) is a screening tool to identify possible weight problems. It provides an estimate of body fat based on height and weight. Your health care provider can find your BMI and can help you achieve or maintain a healthy weight. For males 20 years and older:  A BMI below 18.5 is considered underweight.  A BMI of 18.5 to 24.9 is normal.  A BMI of 25 to 29.9 is considered overweight.  A BMI of 30 and above is considered obese.  Maintain normal blood lipids and cholesterol by exercising and minimizing your intake of saturated fat. Eat a balanced diet with plenty of fruits and vegetables. Blood tests for lipids and cholesterol should begin at age 20 and be repeated every 5 years. If your lipid or cholesterol levels are high, you are over age 50, or you are at high risk for heart disease, you may need your cholesterol levels checked more frequently.Ongoing high lipid and cholesterol levels should be treated with medicines if diet and exercise are not working.  If you smoke, find out from your health care provider how to quit. If you do not use tobacco, do not  start.  Lung cancer screening is recommended for adults aged 55-80 years who are at high risk for developing lung cancer because of a history of smoking. A yearly low-dose CT scan of the lungs is recommended for people who have at least a 30-pack-year history of smoking and are current smokers or have quit within the past 15 years. A pack year of smoking is smoking an average of 1 pack of cigarettes a day for 1 year (for example, a 30-pack-year history of smoking could mean smoking 1 pack a day for 30 years or 2 packs a day for 15 years). Yearly screening should continue until the smoker has stopped smoking for at least 15 years. Yearly screening should be stopped for people who develop a health problem that would prevent them from having lung cancer treatment.  If you choose to drink alcohol, do not have more than 2 drinks per day. One drink is considered to be 12 oz (360 mL) of beer, 5 oz (150 mL) of wine, or 1.5 oz (45 mL) of liquor.  Avoid the use of street drugs. Do not share needles with anyone. Ask for help if you need support or instructions about stopping the use of drugs.  High blood pressure causes heart disease and increases the risk of stroke. Blood pressure should be checked at least every 1-2 years. Ongoing high blood pressure should be treated with medicines if weight loss and exercise are not effective.  If you are 45-79 years old, ask your health care provider if   you should take aspirin to prevent heart disease.  Diabetes screening involves taking a blood sample to check your fasting blood sugar level. This should be done once every 3 years after age 45 if you are at a normal weight and without risk factors for diabetes. Testing should be considered at a younger age or be carried out more frequently if you are overweight and have at least 1 risk factor for diabetes.  Colorectal cancer can be detected and often prevented. Most routine colorectal cancer screening begins at the age of 50  and continues through age 75. However, your health care provider may recommend screening at an earlier age if you have risk factors for colon cancer. On a yearly basis, your health care provider may provide home test kits to check for hidden blood in the stool. A small camera at the end of a tube may be used to directly examine the colon (sigmoidoscopy or colonoscopy) to detect the earliest forms of colorectal cancer. Talk to your health care provider about this at age 50 when routine screening begins. A direct exam of the colon should be repeated every 5-10 years through age 75, unless early forms of precancerous polyps or small growths are found.  People who are at an increased risk for hepatitis B should be screened for this virus. You are considered at high risk for hepatitis B if:  You were born in a country where hepatitis B occurs often. Talk with your health care provider about which countries are considered high risk.  Your parents were born in a high-risk country and you have not received a shot to protect against hepatitis B (hepatitis B vaccine).  You have HIV or AIDS.  You use needles to inject street drugs.  You live with, or have sex with, someone who has hepatitis B.  You are a man who has sex with other men (MSM).  You get hemodialysis treatment.  You take certain medicines for conditions like cancer, organ transplantation, and autoimmune conditions.  Hepatitis C blood testing is recommended for all people born from 1945 through 1965 and any individual with known risk factors for hepatitis C.  Healthy men should no longer receive prostate-specific antigen (PSA) blood tests as part of routine cancer screening. Talk to your health care provider about prostate cancer screening.  Testicular cancer screening is not recommended for adolescents or adult males who have no symptoms. Screening includes self-exam, a health care provider exam, and other screening tests. Consult with your  health care provider about any symptoms you have or any concerns you have about testicular cancer.  Practice safe sex. Use condoms and avoid high-risk sexual practices to reduce the spread of sexually transmitted infections (STIs).  You should be screened for STIs, including gonorrhea and chlamydia if:  You are sexually active and are younger than 24 years.  You are older than 24 years, and your health care provider tells you that you are at risk for this type of infection.  Your sexual activity has changed since you were last screened, and you are at an increased risk for chlamydia or gonorrhea. Ask your health care provider if you are at risk.  If you are at risk of being infected with HIV, it is recommended that you take a prescription medicine daily to prevent HIV infection. This is called pre-exposure prophylaxis (PrEP). You are considered at risk if:  You are a man who has sex with other men (MSM).  You are a heterosexual man who   is sexually active with multiple partners.  You take drugs by injection.  You are sexually active with a partner who has HIV.  Talk with your health care provider about whether you are at high risk of being infected with HIV. If you choose to begin PrEP, you should first be tested for HIV. You should then be tested every 3 months for as long as you are taking PrEP.  Use sunscreen. Apply sunscreen liberally and repeatedly throughout the day. You should seek shade when your shadow is shorter than you. Protect yourself by wearing long sleeves, pants, a wide-brimmed hat, and sunglasses year round whenever you are outdoors.  Tell your health care provider of new moles or changes in moles, especially if there is a change in shape or color. Also, tell your health care provider if a mole is larger than the size of a pencil eraser.  A one-time screening for abdominal aortic aneurysm (AAA) and surgical repair of large AAAs by ultrasound is recommended for men aged  65-75 years who are current or former smokers.  Stay current with your vaccines (immunizations). Document Released: 04/18/2008 Document Revised: 10/26/2013 Document Reviewed: 03/18/2011 [facility] Patient Information 2015 [place], [place]. This information is not intended to replace advice given to you by your health care provider. Make sure you discuss any questions you have with your health care provider. Lichen Sclerosus Lichen sclerosus is a skin problem. It can happen on any part of the body, but it commonly involves the anal or genital areas. Lichen sclerosus is not an infection or a fungus. Girls and women are more commonly affected than boys and men. CAUSES The cause is not known. It could be the result of an overactive immune system or a lack of certain hormones. Lichen sclerosus is not passed from one person to another (not contagious). SYMPTOMS Your skin may have:  Thin, wrinkled, white areas.  Thickened white areas.  Red and swollen patches.  Tears or cracks.  Bruising.  Blood blisters.  Severe itching. You may also have pain, itching, or burning with urination. Constipation is also common in people with lichen sclerosus. DIAGNOSIS Your caregiver will do a physical exam. Sometimes, a tissue sample (biopsy) may be sent for testing. TREATMENT Treatment may involve putting a thin layer of medicated cream (topical steroid) over the areas with lichen sclerosus. Use the cream only as directed by your caregiver.  HOME CARE INSTRUCTIONS  Only take over-the-counter or prescription medicines as directed by your caregiver.  Keep the vaginal area as clean and dry as possible. SEEK MEDICAL CARE IF: You develop increasing pain, swelling, or redness. Document Released: 03/13/2011 Document Revised: 01/13/2012 Document Reviewed: 03/13/2011 [facility] Patient Information 2015 [place], [place]. This information is not intended to replace advice given to you by your health care provider. Make  sure you discuss any questions you have with your health care provider.

## 2015-02-21 NOTE — Progress Notes (Signed)
Subjective:    Patient ID: Luke Gross, male    DOB: [birth year], 29 y.o.   MRN: [id number]  Asthma There is no chest tightness, cough, difficulty breathing, frequent throat clearing, hemoptysis, hoarse voice, shortness of breath, sputum production or wheezing. This is a chronic problem. The current episode started more than 1 year ago. The problem has been gradually improving. Pertinent negatives include no appetite change, chest pain, fever, rhinorrhea, sore throat or trouble swallowing. He reports complete improvement on treatment. His symptoms are not alleviated by beta-agonist and steroid inhaler. His past medical history is significant for asthma.  Rash This is a recurrent problem. The current episode started more than 1 month ago. The problem is unchanged. Pain location: scrotum. The rash is characterized by itchiness and peeling. He was exposed to nothing. Pertinent negatives include no anorexia, congestion, cough, diarrhea, eye pain, facial edema, fatigue, fever, joint pain, nail changes, rhinorrhea, shortness of breath, sore throat or vomiting. Past treatments include nothing. His past medical history is significant for allergies, asthma and eczema. There is no history of varicella.      Review of Systems  Constitutional: Negative.  Negative for fever, chills, diaphoresis, appetite change and fatigue.  HENT: Negative.  Negative for congestion, hoarse voice, rhinorrhea, sore throat and trouble swallowing.   Eyes: Negative.  Negative for pain.  Respiratory: Negative.  Negative for cough, hemoptysis, sputum production, choking, chest tightness, shortness of breath and wheezing.   Cardiovascular: Negative.  Negative for chest pain, palpitations and leg swelling.  Gastrointestinal: Negative.  Negative for vomiting, abdominal pain, diarrhea and anorexia.  Endocrine: Negative.   Genitourinary: Negative.   Musculoskeletal: Negative.  Negative for joint pain.  Skin: Positive for  rash. Negative for nail changes.  Allergic/Immunologic: Negative.   Neurological: Negative.   Hematological: Negative.  Negative for adenopathy. Does not bruise/bleed easily.  Psychiatric/Behavioral: Negative.        Objective:   Physical Exam  Constitutional: He is oriented to person, place, and time. He appears well-developed and well-nourished. No distress.  HENT:  Head: Normocephalic and atraumatic.  Mouth/Throat: Oropharynx is clear and moist. No oropharyngeal exudate.  Eyes: Conjunctivae are normal. Right eye exhibits no discharge. Left eye exhibits no discharge. No scleral icterus.  Neck: Normal range of motion. Neck supple. No JVD present. No tracheal deviation present. No thyromegaly present.  Cardiovascular: Normal rate, regular rhythm, normal heart sounds and intact distal pulses.  Exam reveals no gallop and no friction rub.   No murmur heard. Pulmonary/Chest: Effort normal and breath sounds normal. No stridor. No respiratory distress. He has no wheezes. He has no rales. He exhibits no tenderness.  Abdominal: Soft. Bowel sounds are normal. He exhibits no distension and no mass. There is no tenderness. There is no rebound and no guarding. Hernia confirmed negative in the right inguinal area and confirmed negative in the left inguinal area.  Genitourinary: Testes normal and penis normal.    Right testis shows no mass, no swelling and no tenderness. Right testis is descended. Left testis shows no mass, no swelling and no tenderness. Left testis is descended. Circumcised. No penile erythema or penile tenderness. No discharge found.  Scrotal skin reveals a diffuse pale leathery appearance with scale and lichenification  Musculoskeletal: Normal range of motion. He exhibits no edema or tenderness.  Lymphadenopathy:    He has no cervical adenopathy.       Right: No inguinal adenopathy present.       Left: No inguinal adenopathy  present.  Neurological: He is oriented to person, place,  and time.  Skin: Skin is warm and dry. Rash noted. He is not diaphoretic. No erythema. No pallor.  Vitals reviewed.   Lab Results  Component Value Date   WBC 5.3 02/21/2015   HGB 13.8 02/21/2015   HCT 42.4 02/21/2015   PLT 211.0 02/21/2015   GLUCOSE 95 02/21/2015   CHOL 150 02/21/2015   TRIG 65.0 02/21/2015   HDL 39.60 02/21/2015   LDLCALC 97 02/21/2015   ALT 17 02/21/2015   AST 21 02/21/2015   NA 137 02/21/2015   K 3.9 02/21/2015   CL 107 02/21/2015   CREATININE 0.77 02/21/2015   BUN 25* 02/21/2015   CO2 24 02/21/2015   TSH 0.75 02/21/2015        Assessment & Plan:

## 2015-02-22 ENCOUNTER — Encounter: Payer: Self-pay | Admitting: Internal Medicine

## 2015-02-22 LAB — GC/CHLAMYDIA PROBE AMP, URINE
Chlamydia, Swab/Urine, PCR: NEGATIVE
GC Probe Amp, Urine: NEGATIVE

## 2015-02-22 LAB — RPR

## 2015-02-22 LAB — HIV ANTIBODY (ROUTINE TESTING W REFLEX): HIV 1&2 Ab, 4th Generation: NONREACTIVE

## 2015-03-21 ENCOUNTER — Ambulatory Visit (INDEPENDENT_AMBULATORY_CARE_PROVIDER_SITE_OTHER): Payer: 59 | Admitting: Internal Medicine

## 2015-03-21 ENCOUNTER — Encounter: Payer: Self-pay | Admitting: Internal Medicine

## 2015-03-21 VITALS — BP 118/80 | HR 59 | Temp 98.6°F | Resp 16 | Ht 67.0 in | Wt 240.0 lb

## 2015-03-21 DIAGNOSIS — L280 Lichen simplex chronicus: Secondary | ICD-10-CM

## 2015-03-21 DIAGNOSIS — J4530 Mild persistent asthma, uncomplicated: Secondary | ICD-10-CM | POA: Diagnosis not present

## 2015-03-21 MED ORDER — TRIAMCINOLONE ACETONIDE 0.5 % EX CREA: 1.0000 "application " | TOPICAL_CREAM | Freq: Three times a day (TID) | CUTANEOUS | Status: DC

## 2015-03-21 NOTE — Progress Notes (Signed)
Pre visit review using our clinic review tool, if applicable. No additional management support is needed unless otherwise documented below in the visit note. 

## 2015-03-21 NOTE — Progress Notes (Signed)
Subjective:  Patient ID: Luke Gross, male    DOB: [birth year]  Age: 29 y.o. MRN: [id number]  CC: Asthma and Rash   HPI Luke Gross presents for follow up on rash and asthma - the scrotal rash is much better, he is ready to use a lower potency steroid. His asthma is well controlled - he wants me to order a nebulizer for him to use.  Outpatient Prescriptions Prior to Visit  Medication Sig Dispense Refill  . albuterol (PROVENTIL HFA;VENTOLIN HFA) 108 (90 BASE) MCG/ACT inhaler Inhale 2 puffs into the lungs every 6 (six) hours as needed for wheezing or shortness of breath. 1 Inhaler 11  . albuterol (PROVENTIL) (2.5 MG/3ML) 0.083% nebulizer solution INHALE 1 AMPULE VIA NEBULIZER EVERY 4 TO 6 HOURS AS NEEDED FOR COUGH OR WHEEZE 75 mL 11  . albuterol (PROVENTIL) (5 MG/ML) 0.5% nebulizer solution Take 0.5 mLs (2.5 mg total) by nebulization every 6 (six) hours as needed for wheezing or shortness of breath. 20 mL 12  . doxepin (SINEQUAN) 10 MG capsule Take 1 capsule (10 mg total) by mouth at bedtime. 90 capsule 3  . Fluticasone Furoate-Vilanterol (BREO ELLIPTA) 200-25 MCG/INH AEPB Inhale 1 puff into the lungs daily. 30 each 11  . fluocinonide-emollient (LIDEX-E) 0.05 % cream Apply 1 application topically 2 (two) times daily. 30 g 1   No facility-administered medications prior to visit.    ROS Review of Systems  Constitutional: Negative.  Negative for fever, chills, diaphoresis, appetite change and fatigue.  HENT: Negative.   Eyes: Negative.   Respiratory: Positive for wheezing. Negative for cough, choking, chest tightness, shortness of breath and stridor.   Cardiovascular: Negative.  Negative for chest pain, palpitations and leg swelling.  Gastrointestinal: Negative.  Negative for abdominal pain.  Endocrine: Negative.   Genitourinary: Negative.  Negative for decreased urine volume, scrotal swelling, difficulty urinating and penile pain.  Musculoskeletal: Negative.   Skin:  Positive for rash.  Allergic/Immunologic: Negative.   Neurological: Negative.  Negative for dizziness, tremors, weakness, light-headedness and numbness.  Hematological: Negative.  Negative for adenopathy. Does not bruise/bleed easily.  Psychiatric/Behavioral: Negative.     Objective:  BP 118/80 mmHg  Pulse 59  Temp(Src) 98.6 F (37 C) (Oral)  Resp 16  Ht 5\' 7"  (1.702 m)  Wt 240 lb (108.863 kg)  BMI 37.58 kg/m2  SpO2 97%  BP Readings from Last 3 Encounters:  03/21/15 118/80  02/21/15 130/84  08/22/14 118/78    Wt Readings from Last 3 Encounters:  03/21/15 240 lb (108.863 kg)  02/21/15 235 lb (106.595 kg)  08/22/14 230 lb (104.327 kg)    Physical Exam  Constitutional: He is oriented to person, place, and time. He appears well-developed and well-nourished. No distress.  HENT:  Head: Normocephalic and atraumatic.  Mouth/Throat: Oropharynx is clear and moist. No oropharyngeal exudate.  Eyes: Conjunctivae are normal. Right eye exhibits no discharge. Left eye exhibits no discharge. No scleral icterus.  Neck: Normal range of motion. Neck supple. No JVD present. No tracheal deviation present. No thyromegaly present.  Cardiovascular: Normal rate, regular rhythm, normal heart sounds and intact distal pulses.  Exam reveals no gallop and no friction rub.   No murmur heard. Pulmonary/Chest: Effort normal and breath sounds normal. No stridor. No respiratory distress. He has no wheezes. He has no rales. He exhibits no tenderness.  Abdominal: Soft. Bowel sounds are normal. He exhibits no distension and no mass. There is no tenderness. There is no rebound and no guarding.  Genitourinary: Penis normal. Right testis shows no mass, no swelling and no tenderness. Right testis is descended. Left testis shows no mass, no swelling and no tenderness. Left testis is descended.  Scrotum shows slight hyperpigmentation but there is no lichenification or scale  Musculoskeletal: Normal range of motion. He  exhibits no edema or tenderness.  Lymphadenopathy:    He has no cervical adenopathy.  Neurological: He is oriented to person, place, and time.  Skin: Skin is warm and dry. No rash noted. He is not diaphoretic. No erythema. No pallor.  Vitals reviewed.   Lab Results  Component Value Date   WBC 5.3 02/21/2015   HGB 13.8 02/21/2015   HCT 42.4 02/21/2015   PLT 211.0 02/21/2015   GLUCOSE 95 02/21/2015   CHOL 150 02/21/2015   TRIG 65.0 02/21/2015   HDL 39.60 02/21/2015   LDLCALC 97 02/21/2015   ALT 17 02/21/2015   AST 21 02/21/2015   NA 137 02/21/2015   K 3.9 02/21/2015   CL 107 02/21/2015   CREATININE 0.77 02/21/2015   BUN 25* 02/21/2015   CO2 24 02/21/2015   TSH 0.75 02/21/2015    Dg Nasal Bones  04/13/2014   CLINICAL DATA:  trauma  EXAM: NASAL BONES - 3+ VIEW  COMPARISON:  None.  FINDINGS: There is no evidence of fracture or other bone abnormality.  IMPRESSION: Negative.   Electronically Signed   By: Salome HolmesHector  Cooper M.D.   On: 04/13/2014 13:47    Assessment & Plan:   Luke Gross was seen today for asthma and rash.  Diagnoses and all orders for this visit:  Mild persistent asthma, uncomplicated Orders: -     Ambulatory referral to Home Health  Lichen simplex chronicus Orders: -     triamcinolone cream (KENALOG) 0.5 %; Apply 1 application topically 3 (three) times daily.  I have discontinued Mr. Ermalene SearingMartin's fluocinonide-emollient. I am also having him start on triamcinolone cream. Additionally, I am having him maintain his albuterol, Fluticasone Furoate-Vilanterol, albuterol, albuterol, doxepin, PROAIR RESPICLICK, and BREO ELLIPTA.  Meds ordered this encounter  Medications  . PROAIR RESPICLICK 108 (90 BASE) MCG/ACT AEPB    Sig:   . BREO ELLIPTA 200-25 MCG/INH AEPB    Sig:   . triamcinolone cream (KENALOG) 0.5 %    Sig: Apply 1 application topically 3 (three) times daily.    Dispense:  30 g    Refill:  3     Follow-up: Return in about 6 months (around  09/21/2015).  Sanda Lingerhomas Pauleen Goleman, MD

## 2015-03-21 NOTE — Patient Instructions (Signed)

## 2015-04-06 ENCOUNTER — Other Ambulatory Visit: Payer: Self-pay | Admitting: Internal Medicine

## 2015-04-06 DIAGNOSIS — J4530 Mild persistent asthma, uncomplicated: Secondary | ICD-10-CM

## 2015-04-06 NOTE — Telephone Encounter (Signed)
[facility]

## 2015-06-23 ENCOUNTER — Encounter: Payer: Self-pay | Admitting: Internal Medicine

## 2015-07-04 ENCOUNTER — Encounter: Payer: Self-pay | Admitting: Internal Medicine

## 2015-07-05 ENCOUNTER — Other Ambulatory Visit: Payer: Self-pay | Admitting: Internal Medicine

## 2015-07-05 DIAGNOSIS — L280 Lichen simplex chronicus: Secondary | ICD-10-CM

## 2015-07-05 MED ORDER — TRIAMCINOLONE ACETONIDE 0.5 % EX CREA: 1.0000 "application " | TOPICAL_CREAM | Freq: Three times a day (TID) | CUTANEOUS | Status: DC

## 2015-07-05 MED ORDER — DOXEPIN HCL 10 MG PO CAPS: 10.0000 mg | ORAL_CAPSULE | Freq: Every day | ORAL | Status: DC

## 2015-09-14 ENCOUNTER — Other Ambulatory Visit: Payer: Self-pay | Admitting: Internal Medicine

## 2015-09-18 ENCOUNTER — Other Ambulatory Visit: Payer: Self-pay | Admitting: Internal Medicine

## 2015-09-25 ENCOUNTER — Encounter: Payer: Self-pay | Admitting: Internal Medicine

## 2015-09-26 ENCOUNTER — Other Ambulatory Visit: Payer: Self-pay | Admitting: Internal Medicine

## 2015-09-26 DIAGNOSIS — J4530 Mild persistent asthma, uncomplicated: Secondary | ICD-10-CM

## 2015-09-26 MED ORDER — MOMETASONE FURO-FORMOTEROL FUM 200-5 MCG/ACT IN AERO: 2.0000 | INHALATION_SPRAY | Freq: Two times a day (BID) | RESPIRATORY_TRACT | Status: DC

## 2015-09-26 MED ORDER — METHYLPREDNISOLONE 4 MG PO TBPK: ORAL_TABLET | ORAL | Status: DC

## 2015-09-26 MED ORDER — ALBUTEROL SULFATE HFA 108 (90 BASE) MCG/ACT IN AERS: 1.0000 | INHALATION_SPRAY | Freq: Four times a day (QID) | RESPIRATORY_TRACT | Status: DC | PRN

## 2015-09-30 ENCOUNTER — Telehealth: Payer: Self-pay

## 2015-09-30 NOTE — Telephone Encounter (Signed)
covermymeds HNGLVL approved. Pharmacy notified

## 2015-12-28 ENCOUNTER — Encounter: Payer: Self-pay | Admitting: Internal Medicine

## 2015-12-28 ENCOUNTER — Other Ambulatory Visit: Payer: Self-pay | Admitting: Internal Medicine

## 2015-12-28 DIAGNOSIS — J4530 Mild persistent asthma, uncomplicated: Secondary | ICD-10-CM

## 2015-12-28 DIAGNOSIS — J4531 Mild persistent asthma with (acute) exacerbation: Secondary | ICD-10-CM

## 2015-12-28 MED ORDER — METHYLPREDNISOLONE 4 MG PO TBPK: ORAL_TABLET | ORAL | Status: DC

## 2016-01-02 ENCOUNTER — Ambulatory Visit (INDEPENDENT_AMBULATORY_CARE_PROVIDER_SITE_OTHER): Payer: BLUE CROSS/BLUE SHIELD | Admitting: Internal Medicine

## 2016-01-02 ENCOUNTER — Ambulatory Visit (INDEPENDENT_AMBULATORY_CARE_PROVIDER_SITE_OTHER)
Admission: RE | Admit: 2016-01-02 | Discharge: 2016-01-02 | Disposition: A | Discharge status: Home / Self Care | Payer: BLUE CROSS/BLUE SHIELD | Source: Ambulatory Visit | Attending: Internal Medicine | Admitting: Internal Medicine

## 2016-01-02 ENCOUNTER — Encounter: Payer: Self-pay | Admitting: Internal Medicine

## 2016-01-02 VITALS — BP 140/106 | HR 64 | Temp 97.9°F | Resp 16 | Ht 67.0 in | Wt 240.0 lb

## 2016-01-02 DIAGNOSIS — S6991XA Unspecified injury of right wrist, hand and finger(s), initial encounter: Secondary | ICD-10-CM

## 2016-01-02 DIAGNOSIS — S6990XA Unspecified injury of unspecified wrist, hand and finger(s), initial encounter: Secondary | ICD-10-CM | POA: Insufficient documentation

## 2016-01-02 DIAGNOSIS — I10 Essential (primary) hypertension: Secondary | ICD-10-CM | POA: Diagnosis not present

## 2016-01-02 DIAGNOSIS — S62609A Fracture of unspecified phalanx of unspecified finger, initial encounter for closed fracture: Secondary | ICD-10-CM

## 2016-01-02 MED ORDER — NEBIVOLOL HCL 10 MG PO TABS: 10.0000 mg | ORAL_TABLET | Freq: Every day | ORAL | Status: DC

## 2016-01-02 NOTE — Patient Instructions (Signed)
Hypertension Hypertension, commonly called high blood pressure, is when the force of blood pumping through your arteries is too strong. Your arteries are the blood vessels that carry blood from your heart throughout your body. A blood pressure reading consists of a higher number over a lower number, such as 110/72. The higher number (systolic) is the pressure inside your arteries when your heart pumps. The lower number (diastolic) is the pressure inside your arteries when your heart relaxes. Ideally you want your blood pressure below 120/80. Hypertension forces your heart to work harder to pump blood. Your arteries may become narrow or stiff. Having untreated or uncontrolled hypertension can cause heart attack, stroke, kidney disease, and other problems. RISK FACTORS Some risk factors for high blood pressure are controllable. Others are not.  Risk factors you cannot control include:   Race. You may be at higher risk if you are African American.  Age. Risk increases with age.  Gender. Men are at higher risk than women before age 45 years. After age 65, women are at higher risk than men. Risk factors you can control include:  Not getting enough exercise or physical activity.  Being overweight.  Getting too much fat, sugar, calories, or salt in your diet.  Drinking too much alcohol. SIGNS AND SYMPTOMS Hypertension does not usually cause signs or symptoms. Extremely high blood pressure (hypertensive crisis) may cause headache, anxiety, shortness of breath, and nosebleed. DIAGNOSIS To check if you have hypertension, your health care provider will measure your blood pressure while you are seated, with your arm held at the level of your heart. It should be measured at least twice using the same arm. Certain conditions can cause a difference in blood pressure between your right and left arms. A blood pressure reading that is higher than normal on one occasion does not mean that you need treatment. If  it is not clear whether you have high blood pressure, you may be asked to return on a different day to have your blood pressure checked again. Or, you may be asked to monitor your blood pressure at home for 1 or more weeks. TREATMENT Treating high blood pressure includes making lifestyle changes and possibly taking medicine. Living a healthy lifestyle can help lower high blood pressure. You may need to change some of your habits. Lifestyle changes may include:  Following the DASH diet. This diet is high in fruits, vegetables, and whole grains. It is low in salt, red meat, and added sugars.  Keep your sodium intake below 2,300 mg per day.  Getting at least 30-45 minutes of aerobic exercise at least 4 times per week.  Losing weight if necessary.  Not smoking.  Limiting alcoholic beverages.  Learning ways to reduce stress. Your health care provider may prescribe medicine if lifestyle changes are not enough to get your blood pressure under control, and if one of the following is true:  You are 18-59 years of age and your systolic blood pressure is above 140.  You are 60 years of age or older, and your systolic blood pressure is above 150.  Your diastolic blood pressure is above 90.  You have diabetes, and your systolic blood pressure is over 140 or your diastolic blood pressure is over 90.  You have kidney disease and your blood pressure is above 140/90.  You have heart disease and your blood pressure is above 140/90. Your personal target blood pressure may vary depending on your medical conditions, your age, and other factors. HOME CARE INSTRUCTIONS    Have your blood pressure rechecked as directed by your health care provider.   Take medicines only as directed by your health care provider. Follow the directions carefully. Blood pressure medicines must be taken as prescribed. The medicine does not work as well when you skip doses. Skipping doses also puts you at risk for  problems.  Do not smoke.   Monitor your blood pressure at home as directed by your health care provider. SEEK MEDICAL CARE IF:   You think you are having a reaction to medicines taken.  You have recurrent headaches or feel dizzy.  You have swelling in your ankles.  You have trouble with your vision. SEEK IMMEDIATE MEDICAL CARE IF:  You develop a severe headache or confusion.  You have unusual weakness, numbness, or feel faint.  You have severe chest or abdominal pain.  You vomit repeatedly.  You have trouble breathing. MAKE SURE YOU:   Understand these instructions.  Will watch your condition.  Will get help right away if you are not doing well or get worse.   This information is not intended to replace advice given to you by your health care provider. Make sure you discuss any questions you have with your health care provider.   Document Released: 10/21/2005 Document Revised: 03/07/2015 Document Reviewed: 08/13/2013 Elsevier Interactive Patient Education 2016 Elsevier Inc.  

## 2016-01-03 NOTE — Progress Notes (Signed)
Subjective:  Patient ID: Luke Gross, male    DOB: 09-05-1986  Age: 30 y.o. MRN: [id number]  CC: Hypertension and Finger Injury   HPI Luke Gross presents for A blood pressure check but he also complains about his right fifth finger.  He reports that about 3 weeks ago he had a hyperextension injury to his right fifth finger while playing basketball. He complains of persistent pain and swelling in the proximal phalanx. He made a splint at home that he has been using.  He called a week ago and asked for a course of steroids because she was having an asthma flare. He tells me that he is getting much better with less wheezing. He denies cough, fever, chills, or shortness of breath. He is getting symptom relief with his inhalers.  He also needs a follow-up on his blood pressure. He has not been checking it at home but also denies any hypertension symptoms such as headache, chest pain, shortness of breath, or blurred vision.  Outpatient Prescriptions Prior to Visit  Medication Sig Dispense Refill  . albuterol (PROVENTIL) (2.5 MG/3ML) 0.083% nebulizer solution USE 1 VIAL VIA NEBULIZER EVERY 4-6 HOURS AS NEEDED FOR COUGH OR WHEEZE 75 mL 11  . albuterol (PROVENTIL) (5 MG/ML) 0.5% nebulizer solution Take 0.5 mLs (2.5 mg total) by nebulization every 6 (six) hours as needed for wheezing or shortness of breath. 20 mL 12  . doxepin (SINEQUAN) 10 MG capsule Take 1 capsule (10 mg total) by mouth at bedtime. 90 capsule 3  . mometasone-formoterol (DULERA) 200-5 MCG/ACT AERO Inhale 2 puffs into the lungs 2 (two) times daily. 13 g 11  . PROAIR RESPICLICK 108 (90 BASE) MCG/ACT AEPB INHALE 1 PUFF INTO LUNGS FOUR TIMES DAILY AS NEEDED 1 each 11  . triamcinolone cream (KENALOG) 0.5 % Apply 1 application topically 3 (three) times daily. 30 g 3  . ADVAIR DISKUS 250-50 MCG/DOSE AEPB INHALE 1 PUFF INTO THE LUNGS AS DIRECTED. 60 each 5  . albuterol (PROVENTIL HFA) 108 (90 BASE) MCG/ACT inhaler Inhale  1-2 puffs into the lungs every 6 (six) hours as needed for wheezing or shortness of breath. 18 g 11  . albuterol (PROVENTIL HFA;VENTOLIN HFA) 108 (90 BASE) MCG/ACT inhaler Inhale 2 puffs into the lungs every 6 (six) hours as needed for wheezing or shortness of breath. 1 Inhaler 11  . methylPREDNISolone (MEDROL DOSEPAK) 4 MG TBPK tablet TAKE AS DIRECTED 21 tablet 0   No facility-administered medications prior to visit.    ROS Review of Systems  Constitutional: Negative.  Negative for fever, chills, diaphoresis, appetite change and fatigue.  HENT: Negative.  Negative for sinus pressure, sore throat and trouble swallowing.   Eyes: Negative.   Respiratory: Positive for wheezing. Negative for cough, choking, chest tightness, shortness of breath and stridor.   Cardiovascular: Negative.  Negative for chest pain, palpitations and leg swelling.  Gastrointestinal: Negative.  Negative for nausea, vomiting, abdominal pain, diarrhea and constipation.  Endocrine: Negative.   Genitourinary: Negative.   Musculoskeletal: Positive for arthralgias. Negative for myalgias, back pain and neck pain.  Skin: Negative.  Negative for color change, pallor and rash.  Allergic/Immunologic: Negative.   Neurological: Negative.   Hematological: Negative.  Negative for adenopathy. Does not bruise/bleed easily.  Psychiatric/Behavioral: Negative.     Objective:  BP 140/106 mmHg  Pulse 64  Temp(Src) 97.9 F (36.6 C) (Oral)  Resp 16  Ht  (1.702 m)  Wt 240 lb (108.863 kg)  BMI 37.58 kg/m2  SpO2 97%  BP Readings from Last 3 Encounters:  01/02/16 140/106  03/21/15 118/80  02/21/15 130/84    Wt Readings from Last 3 Encounters:  01/02/16 240 lb (108.863 kg)  03/21/15 240 lb (108.863 kg)  02/21/15 235 lb (106.595 kg)    Physical Exam  Constitutional: He is oriented to person, place, and time. He appears well-developed and well-nourished.  Non-toxic appearance. He does not have a sickly appearance. He does  not appear ill. No distress.  HENT:  Head: Normocephalic and atraumatic.  Mouth/Throat: Oropharynx is clear and moist. No oropharyngeal exudate.  Eyes: Conjunctivae are normal. Right eye exhibits no discharge. Left eye exhibits no discharge. No scleral icterus.  Neck: Normal range of motion. Neck supple. No JVD present. No tracheal deviation present. No thyromegaly present.  Cardiovascular: Normal rate, regular rhythm, normal heart sounds and intact distal pulses.  Exam reveals no gallop and no friction rub.   No murmur heard. Pulmonary/Chest: Effort normal. No stridor. No respiratory distress. He has no decreased breath sounds. He has wheezes in the left middle field. He has no rhonchi. He has no rales. He exhibits no tenderness.  Abdominal: Soft. Bowel sounds are normal. He exhibits no distension and no mass. There is no tenderness. There is no rebound and no guarding.  Musculoskeletal: Normal range of motion. He exhibits no edema.       Right hand: He exhibits tenderness, bony tenderness, laceration and swelling. He exhibits normal range of motion and normal capillary refill. Normal sensation noted. Normal strength noted.       Hands: Lymphadenopathy:    He has no cervical adenopathy.  Neurological: He is oriented to person, place, and time.  Skin: Skin is warm and dry. No rash noted. He is not diaphoretic. No erythema. No pallor.  Vitals reviewed.   Lab Results  Component Value Date   WBC 5.3 02/21/2015   HGB 13.8 02/21/2015   HCT 42.4 02/21/2015   PLT 211.0 02/21/2015   GLUCOSE 95 02/21/2015   CHOL 150 02/21/2015   TRIG 65.0 02/21/2015   HDL 39.60 02/21/2015   LDLCALC 97 02/21/2015   ALT 17 02/21/2015   AST 21 02/21/2015   NA 137 02/21/2015   K 3.9 02/21/2015   CL 107 02/21/2015   CREATININE 0.77 02/21/2015   BUN 25* 02/21/2015   CO2 24 02/21/2015   TSH 0.75 02/21/2015    Dg Finger Little Right  01/02/2016  CLINICAL DATA:  Sprain right little finger 3 weeks ago playing  basketball. Pain and swelling. EXAM: RIGHT LITTLE FINGER 2+V COMPARISON:  10/04/2013 FINDINGS: There is a mildly comminuted fracture at the base of the right little finger proximal phalanx with intra-articular extension into the MCP joint. No significant displacement. No subluxation or dislocation. IMPRESSION: Mildly comminuted intra-articular fracture in the proximal phalanx of the right little finger. Electronically Signed   By: Charlett Nose M.D.   On: 01/02/2016 10:55    Assessment & Plan:   Florian was seen today for hypertension and finger injury.  Diagnoses and all orders for this visit:  Essential hypertension, benign- his blood pressure is not adequately well controlled, I will start treating this with Bystolic -     nebivolol (BYSTOLIC) 10 MG tablet; Take 1 tablet (10 mg total) by mouth daily.  Finger injury, right, initial encounter- there is a fracture in the MCP joint -     DG Finger Little Right; Future -     Ambulatory referral to Orthopedic Surgery  Finger fracture, closed, initial encounter- there is an intra-articular fracture, I've asked him to start taking an over-the-counter anti-inflammatory like ibuprofen and to continue using the splint that he is made, and also asked him to see orthopedics to see if this needs to be treated any further. -     Ambulatory referral to Orthopedic Surgery   I have discontinued Mr. Schweigert ADVAIR DISKUS, methylPREDNISolone, and methylPREDNISolone. I am also having him start on nebivolol. Additionally, I am having him maintain his albuterol, triamcinolone cream, doxepin, PROAIR RESPICLICK, albuterol, and mometasone-formoterol.  Meds ordered this encounter  Medications  . DISCONTD: methylPREDNISolone (MEDROL DOSEPAK) 4 MG TBPK tablet    Sig: TK UTD    Refill:  0  . nebivolol (BYSTOLIC) 10 MG tablet    Sig: Take 1 tablet (10 mg total) by mouth daily.    Dispense:  49 tablet    Refill:  0     Follow-up: Return in about 6 weeks  (around 02/13/2016).  Sanda Linger, MD

## 2016-02-22 ENCOUNTER — Encounter: Payer: Self-pay | Admitting: Internal Medicine

## 2016-02-22 ENCOUNTER — Other Ambulatory Visit: Payer: Self-pay | Admitting: Internal Medicine

## 2016-02-22 ENCOUNTER — Telehealth: Payer: Self-pay

## 2016-02-22 DIAGNOSIS — I10 Essential (primary) hypertension: Secondary | ICD-10-CM

## 2016-02-22 MED ORDER — NEBIVOLOL HCL 10 MG PO TABS: 10.0000 mg | ORAL_TABLET | Freq: Every day | ORAL | Status: DC

## 2016-02-22 NOTE — Telephone Encounter (Signed)
PA initiated via CoverMyMeds key [id number]

## 2016-02-22 NOTE — Telephone Encounter (Signed)
APPROVED through 11/03/2038 

## 2016-04-04 ENCOUNTER — Encounter: Payer: Self-pay | Admitting: Internal Medicine

## 2016-04-05 ENCOUNTER — Other Ambulatory Visit: Payer: Self-pay | Admitting: Internal Medicine

## 2016-04-05 DIAGNOSIS — J4531 Mild persistent asthma with (acute) exacerbation: Secondary | ICD-10-CM

## 2016-04-05 MED ORDER — METHYLPREDNISOLONE 4 MG PO TBPK: ORAL_TABLET | ORAL | Status: DC

## 2016-11-19 ENCOUNTER — Other Ambulatory Visit: Payer: Self-pay | Admitting: Internal Medicine

## 2016-11-19 DIAGNOSIS — J4531 Mild persistent asthma with (acute) exacerbation: Secondary | ICD-10-CM

## 2016-11-19 DIAGNOSIS — J4530 Mild persistent asthma, uncomplicated: Secondary | ICD-10-CM

## 2016-11-25 MED ORDER — METHYLPREDNISOLONE 4 MG PO TBPK: ORAL_TABLET | ORAL | 0 refills | Status: DC

## 2016-11-25 NOTE — Telephone Encounter (Signed)
Please advise 

## 2016-11-26 ENCOUNTER — Encounter: Payer: Self-pay | Admitting: Internal Medicine

## 2016-11-26 ENCOUNTER — Ambulatory Visit (INDEPENDENT_AMBULATORY_CARE_PROVIDER_SITE_OTHER): Payer: BLUE CROSS/BLUE SHIELD | Admitting: Internal Medicine

## 2016-11-26 ENCOUNTER — Other Ambulatory Visit (INDEPENDENT_AMBULATORY_CARE_PROVIDER_SITE_OTHER): Payer: BLUE CROSS/BLUE SHIELD

## 2016-11-26 VITALS — BP 120/80 | HR 60 | Temp 98.1°F | Resp 20 | Ht 67.0 in | Wt 236.0 lb

## 2016-11-26 DIAGNOSIS — J4531 Mild persistent asthma with (acute) exacerbation: Secondary | ICD-10-CM

## 2016-11-26 DIAGNOSIS — L600 Ingrowing nail: Secondary | ICD-10-CM | POA: Insufficient documentation

## 2016-11-26 DIAGNOSIS — Z0000 Encounter for general adult medical examination without abnormal findings: Secondary | ICD-10-CM | POA: Diagnosis not present

## 2016-11-26 DIAGNOSIS — E559 Vitamin D deficiency, unspecified: Secondary | ICD-10-CM

## 2016-11-26 DIAGNOSIS — J4530 Mild persistent asthma, uncomplicated: Secondary | ICD-10-CM | POA: Diagnosis not present

## 2016-11-26 DIAGNOSIS — N469 Male infertility, unspecified: Secondary | ICD-10-CM

## 2016-11-26 DIAGNOSIS — I10 Essential (primary) hypertension: Secondary | ICD-10-CM | POA: Diagnosis not present

## 2016-11-26 LAB — COMPREHENSIVE METABOLIC PANEL
ALBUMIN: 4.2 g/dL (ref 3.5–5.2)
ALT: 19 U/L (ref 0–53)
AST: 19 U/L (ref 0–37)
Alkaline Phosphatase: 41 U/L (ref 39–117)
BILIRUBIN TOTAL: 0.5 mg/dL (ref 0.2–1.2)
BUN: 17 mg/dL (ref 6–23)
CHLORIDE: 109 meq/L (ref 96–112)
CO2: 27 mEq/L (ref 19–32)
CREATININE: 0.78 mg/dL (ref 0.40–1.50)
Calcium: 9.4 mg/dL (ref 8.4–10.5)
GFR: 150.14 mL/min (ref >60.00)
Glucose, Bld: 87 mg/dL (ref 70–99)
Potassium: 3.9 mEq/L (ref 3.5–5.1)
SODIUM: 140 meq/L (ref 135–145)
Total Protein: 7.2 g/dL (ref 6.0–8.3)

## 2016-11-26 LAB — CBC WITH DIFFERENTIAL/PLATELET
BASOS ABS: 0 10*3/uL (ref 0.0–0.1)
BASOS PCT: 0.5 % (ref 0.0–3.0)
EOS ABS: 0.2 10*3/uL (ref 0.0–0.7)
Eosinophils Relative: 3.4 % (ref 0.0–5.0)
HCT: 39.4 % (ref 39.0–52.0)
HEMOGLOBIN: 13.2 g/dL (ref 13.0–17.0)
Lymphocytes Relative: 36.7 % (ref 12.0–46.0)
Lymphs Abs: 1.9 10*3/uL (ref 0.7–4.0)
MCHC: 33.4 g/dL (ref 30.0–36.0)
MCV: 87 fl (ref 78.0–100.0)
MONO ABS: 0.8 10*3/uL (ref 0.1–1.0)
Monocytes Relative: 15.1 % — ABNORMAL HIGH (ref 3.0–12.0)
NEUTROS ABS: 2.3 10*3/uL (ref 1.4–7.7)
Neutrophils Relative %: 44.3 % (ref 43.0–77.0)
PLATELETS: 253 10*3/uL (ref 150.0–400.0)
RBC: 4.53 Mil/uL (ref 4.22–5.81)
RDW: 13.4 % (ref 11.5–15.5)
WBC: 5.1 10*3/uL (ref 4.0–10.5)

## 2016-11-26 LAB — LIPID PANEL
CHOLESTEROL: 140 mg/dL (ref 0–200)
HDL: 37.2 mg/dL — ABNORMAL LOW (ref >39.00)
LDL Cholesterol: 94 mg/dL (ref 0–99)
NonHDL: 102.64
TRIGLYCERIDES: 45 mg/dL (ref 0.0–149.0)
Total CHOL/HDL Ratio: 4
VLDL: 9 mg/dL (ref 0.0–40.0)

## 2016-11-26 LAB — TSH: TSH: 0.37 u[IU]/mL (ref 0.35–4.50)

## 2016-11-26 MED ORDER — METHYLPREDNISOLONE 4 MG PO TBPK: ORAL_TABLET | ORAL | 0 refills | Status: DC

## 2016-11-26 MED ORDER — MOMETASONE FURO-FORMOTEROL FUM 200-5 MCG/ACT IN AERO: 2.0000 | INHALATION_SPRAY | Freq: Two times a day (BID) | RESPIRATORY_TRACT | 11 refills | Status: DC

## 2016-11-26 NOTE — Progress Notes (Signed)
Pre visit review using our clinic review tool, if applicable. No additional management support is needed unless otherwise documented below in the visit note. 

## 2016-11-26 NOTE — Progress Notes (Signed)
Subjective:  Patient ID: Luke Gross, male    DOB: [birth year]  Age: 31 y.o. MRN: [id number]  CC: Hypertension; Asthma; and Annual Exam   HPI Luke GlassingChristopher L Gross presents for a CPX.  He complains of a nonproductive cough and wheezing for several weeks. He does not consistently use Dulera. He does get some symptom relief with albuterol. He tells me his blood pressures been well controlled and he has not been taking nebivolol.  He complains about an abnormality in his right fifth toenail. It is been deformed for about a year and causes him discomfort.  He wants to be referred to urology for an infertility workup.  Outpatient Medications Prior to Visit  Medication Sig Dispense Refill  . albuterol (PROVENTIL) (5 MG/ML) 0.5% nebulizer solution Take 0.5 mLs (2.5 mg total) by nebulization every 6 (six) hours as needed for wheezing or shortness of breath. 20 mL 12  . PROAIR HFA 108 (90 Base) MCG/ACT inhaler INHALE 1-2 PUFFS INTO THE LUNGS EVERY 6 HOURS AS NEEDED FOR WHEEZING OR SHORTNESS OF BREATH 8.5 g 0  . doxepin (SINEQUAN) 10 MG capsule Take 1 capsule (10 mg total) by mouth at bedtime. 90 capsule 3  . DULERA 200-5 MCG/ACT AERO INHALE 2 PUFFS INTO THE LUNGS TWICE DAILY 13 g 0  . methylPREDNISolone (MEDROL DOSEPAK) 4 MG TBPK tablet TAKE AS DIRECTED 21 tablet 0  . PROAIR RESPICLICK 108 (90 BASE) MCG/ACT AEPB INHALE 1 PUFF INTO LUNGS FOUR TIMES DAILY AS NEEDED 1 each 11  . triamcinolone cream (KENALOG) 0.5 % Apply 1 application topically 3 (three) times daily. 30 g 3  . albuterol (PROVENTIL) (2.5 MG/3ML) 0.083% nebulizer solution USE 1 VIAL VIA NEBULIZER EVERY 4-6 HOURS AS NEEDED FOR COUGH OR WHEEZE 75 mL 0  . nebivolol (BYSTOLIC) 10 MG tablet Take 1 tablet (10 mg total) by mouth daily. 90 tablet 3   No facility-administered medications prior to visit.     ROS Review of Systems  Constitutional: Negative for appetite change, diaphoresis, fatigue and unexpected weight change.    HENT: Negative.  Negative for congestion, facial swelling, sinus pressure and trouble swallowing.   Eyes: Negative for visual disturbance.  Respiratory: Positive for cough, shortness of breath and wheezing. Negative for choking, chest tightness and stridor.   Cardiovascular: Negative for chest pain, palpitations and leg swelling.  Gastrointestinal: Negative.  Negative for abdominal pain, constipation, diarrhea, nausea and vomiting.  Endocrine: Negative.   Genitourinary: Negative.  Negative for difficulty urinating, dysuria, hematuria, penile swelling, scrotal swelling, testicular pain and urgency.  Musculoskeletal: Negative for back pain, myalgias and neck pain.  Skin: Negative.  Negative for pallor and rash.  Allergic/Immunologic: Negative.   Neurological: Negative.  Negative for dizziness.  Hematological: Negative for adenopathy. Does not bruise/bleed easily.  Psychiatric/Behavioral: Negative.     Objective:  BP 120/80 (BP Location: Left Arm, Patient Position: Sitting, Cuff Size: Large)   Pulse 60   Temp 98.1 F (36.7 C) (Oral)   Resp 20   Ht 5\' 7"  (1.702 m)   Wt 236 lb (107 kg)   SpO2 96%   BMI 36.96 kg/m   BP Readings from Last 3 Encounters:  11/26/16 120/80  01/02/16 (!) 140/106  03/21/15 118/80    Wt Readings from Last 3 Encounters:  11/26/16 236 lb (107 kg)  01/02/16 240 lb (108.9 kg)  03/21/15 240 lb (108.9 kg)    Physical Exam  Constitutional: He is oriented to person, place, and time. No distress.  HENT:  Mouth/Throat: Oropharynx is clear and moist. No oropharyngeal exudate.  Eyes: Conjunctivae are normal. Right eye exhibits no discharge. Left eye exhibits no discharge. No scleral icterus.  Neck: Normal range of motion. Neck supple. No JVD present. No tracheal deviation present. No thyromegaly present.  Cardiovascular: Normal rate, regular rhythm, normal heart sounds and intact distal pulses.  Exam reveals no gallop and no friction rub.   No murmur  heard. Pulmonary/Chest: Effort normal. No accessory muscle usage or stridor. No respiratory distress. He has no decreased breath sounds. He has wheezes in the right middle field, the left middle field and the left lower field. He has no rhonchi. He has no rales. He exhibits no tenderness.  He has diffuse, bilateral inspiratory wheezing but good air movement  Abdominal: Soft. Bowel sounds are normal. He exhibits no distension and no mass. There is no tenderness. There is no rebound and no guarding. Hernia confirmed negative in the right inguinal area and confirmed negative in the left inguinal area.  Genitourinary: Testes normal. Right testis shows no mass, no swelling and no tenderness. Right testis is descended. Left testis shows no mass, no swelling and no tenderness. Left testis is descended. Circumcised. No penile erythema or penile tenderness. No discharge found.  Musculoskeletal: Normal range of motion. He exhibits no edema, tenderness or deformity.  Right fifth toenail is dysmorphic and slightly ingrown on the lateral aspect, there is no erythema, exudate, or tenderness.  Lymphadenopathy:    He has no cervical adenopathy.       Right: No inguinal adenopathy present.       Left: No inguinal adenopathy present.  Neurological: He is oriented to person, place, and time.  Skin: Skin is warm and dry. No rash noted. He is not diaphoretic. No erythema. No pallor.  Psychiatric: He has a normal mood and affect. His behavior is normal. Judgment and thought content normal.  Vitals reviewed.   Lab Results  Component Value Date   WBC 5.1 11/26/2016   HGB 13.2 11/26/2016   HCT 39.4 11/26/2016   PLT 253.0 11/26/2016   GLUCOSE 87 11/26/2016   CHOL 140 11/26/2016   TRIG 45.0 11/26/2016   HDL 37.20 (L) 11/26/2016   LDLCALC 94 11/26/2016   ALT 19 11/26/2016   AST 19 11/26/2016   NA 140 11/26/2016   K 3.9 11/26/2016   CL 109 11/26/2016   CREATININE 0.78 11/26/2016   BUN 17 11/26/2016   CO2 27  11/26/2016   TSH 0.37 11/26/2016    Dg Finger Little Right  Result Date: 01/02/2016 CLINICAL DATA:  Sprain right little finger 3 weeks ago playing basketball. Pain and swelling. EXAM: RIGHT LITTLE FINGER 2+V COMPARISON:  10/04/2013 FINDINGS: There is a mildly comminuted fracture at the base of the right little finger proximal phalanx with intra-articular extension into the MCP joint. No significant displacement. No subluxation or dislocation. IMPRESSION: Mildly comminuted intra-articular fracture in the proximal phalanx of the right little finger. Electronically Signed   By: Charlett Nose M.D.   On: 01/02/2016 10:55    Assessment & Plan:   Durrell was seen today for hypertension, asthma and annual exam.  Diagnoses and all orders for this visit:  Routine general medical examination at a health care facility- He refused a flu vaccine today, exam completed, labs ordered and reviewed, patient education material was given. -     Lipid panel; Future -     Comprehensive metabolic panel; Future -     CBC with Differential/Platelet; Future -  TSH; Future  Vitamin D deficiency  Essential hypertension, benign- his blood pressures adequately well-controlled on no medications  Mild persistent asthma, uncomplicated- I've asked him to be more compliant with the LABA/ICS combination inhaler. -     mometasone-formoterol (DULERA) 200-5 MCG/ACT AERO; Inhale 2 puffs into the lungs 2 (two) times daily.  Mild persistent asthma with acute exacerbation- I will treat this exacerbation with a course of methylprednisolone -     methylPREDNISolone (MEDROL DOSEPAK) 4 MG TBPK tablet; TAKE AS DIRECTED  Ingrown nail of fifth toe of right foot -     Ambulatory referral to Podiatry  Male infertility -     Ambulatory referral to Urology   I have discontinued Mr. Coopersmith triamcinolone cream, doxepin, PROAIR RESPICLICK, and nebivolol. I have also changed his DULERA to mometasone-formoterol. Additionally, I  am having him maintain his albuterol, PROAIR HFA, and methylPREDNISolone.  Meds ordered this encounter  Medications  . mometasone-formoterol (DULERA) 200-5 MCG/ACT AERO    Sig: Inhale 2 puffs into the lungs 2 (two) times daily.    Dispense:  13 g    Refill:  11  . methylPREDNISolone (MEDROL DOSEPAK) 4 MG TBPK tablet    Sig: TAKE AS DIRECTED    Dispense:  21 tablet    Refill:  0     Follow-up: Return in about 4 months (around 03/26/2017).  Sanda Linger, MD

## 2016-11-26 NOTE — Patient Instructions (Addendum)

## 2016-12-19 ENCOUNTER — Encounter: Payer: Self-pay | Admitting: Podiatry

## 2016-12-19 ENCOUNTER — Ambulatory Visit (INDEPENDENT_AMBULATORY_CARE_PROVIDER_SITE_OTHER): Payer: BLUE CROSS/BLUE SHIELD | Admitting: Podiatry

## 2016-12-19 ENCOUNTER — Ambulatory Visit (INDEPENDENT_AMBULATORY_CARE_PROVIDER_SITE_OTHER): Payer: BLUE CROSS/BLUE SHIELD

## 2016-12-19 VITALS — BP 154/97 | HR 58 | Resp 16

## 2016-12-19 DIAGNOSIS — M2042 Other hammer toe(s) (acquired), left foot: Secondary | ICD-10-CM | POA: Diagnosis not present

## 2016-12-19 DIAGNOSIS — M898X9 Other specified disorders of bone, unspecified site: Secondary | ICD-10-CM

## 2016-12-19 DIAGNOSIS — M2041 Other hammer toe(s) (acquired), right foot: Secondary | ICD-10-CM | POA: Diagnosis not present

## 2016-12-19 DIAGNOSIS — Q828 Other specified congenital malformations of skin: Secondary | ICD-10-CM | POA: Diagnosis not present

## 2016-12-19 NOTE — Progress Notes (Signed)
   Subjective:    Patient ID: Luke Gross, male    DOB: Nov 02, 1986, 31 y.o.   MRN: [id number]  HPI: He presents today with a chief complaint of painful calluses to the lateral aspect of the fifth toes bilateral. He states that they hurt when he walks on them. He has trimmed in order to get relief.: Vital signs are stable alert and oriented 3. Pulses are strongly palpable. Neurologic exam is intact. Deep tendon reflexes are intact. Muscle strength +5 over 5 dorsiflexion plantar flexors and inverters everters all to the musculature is intact. Orthopedic evaluation demonstrate all joints distal to the ankle range of motion or crepitation.  Varus rotated hammertoe deformities bilateral. This is resulting in Luke Gross. He was along the fibular border of the digits bilateral. No open lesions or wounds are noted.  Review of Systems  All other systems reviewed and are negative.      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  Plan: Debrided these areas today for him follow-up with us on an as-needed basis for surgical intervention.

## 2017-03-07 ENCOUNTER — Other Ambulatory Visit: Payer: Self-pay | Admitting: Internal Medicine

## 2017-04-27 ENCOUNTER — Other Ambulatory Visit: Payer: Self-pay | Admitting: Internal Medicine

## 2017-04-27 DIAGNOSIS — I10 Essential (primary) hypertension: Secondary | ICD-10-CM

## 2017-04-28 ENCOUNTER — Other Ambulatory Visit: Payer: Self-pay | Admitting: Internal Medicine

## 2017-04-28 DIAGNOSIS — J4530 Mild persistent asthma, uncomplicated: Secondary | ICD-10-CM

## 2017-04-28 MED ORDER — ALBUTEROL SULFATE HFA 108 (90 BASE) MCG/ACT IN AERS: INHALATION_SPRAY | RESPIRATORY_TRACT | 3 refills | Status: DC

## 2017-08-27 ENCOUNTER — Other Ambulatory Visit: Payer: Self-pay | Admitting: Internal Medicine

## 2018-02-06 ENCOUNTER — Other Ambulatory Visit: Payer: Self-pay | Admitting: Internal Medicine

## 2018-02-06 DIAGNOSIS — J4530 Mild persistent asthma, uncomplicated: Secondary | ICD-10-CM

## 2018-02-09 ENCOUNTER — Other Ambulatory Visit: Payer: Self-pay | Admitting: Internal Medicine

## 2018-02-09 DIAGNOSIS — J4530 Mild persistent asthma, uncomplicated: Secondary | ICD-10-CM

## 2018-02-25 ENCOUNTER — Other Ambulatory Visit: Payer: Self-pay | Admitting: Internal Medicine

## 2018-02-25 DIAGNOSIS — J4530 Mild persistent asthma, uncomplicated: Secondary | ICD-10-CM

## 2018-02-25 NOTE — Telephone Encounter (Signed)
Dulera Last OV:11/19/16; Upcoming 03/24/18 Last filled: 11/26/16 11 refills [id number] - [place] [facility] [address] [facility] [address] [facility] [address] [phone] (Phone) [phone] (Fax)

## 2018-02-25 NOTE — Telephone Encounter (Signed)
Copied from CRM [id number]. Topic: Quick Communication - Rx Refill/Question >> Feb 25, 2018  8:53 AM Leafy Roobinson, Norma J wrote: Medication:generic dulera  Has the patient contacted their pharmacy? Yes  (Agent: If no, request that the patient contact the pharmacy for the refill.) Preferred Pharmacy (with phone number or street name): [facility]  . Pt has an appt sch for 03-24-18

## 2018-02-26 MED ORDER — MOMETASONE FURO-FORMOTEROL FUM 200-5 MCG/ACT IN AERO: 2.0000 | INHALATION_SPRAY | Freq: Two times a day (BID) | RESPIRATORY_TRACT | 0 refills | Status: DC

## 2018-03-06 ENCOUNTER — Ambulatory Visit: Payer: BLUE CROSS/BLUE SHIELD | Admitting: Podiatry

## 2018-03-06 DIAGNOSIS — M2041 Other hammer toe(s) (acquired), right foot: Secondary | ICD-10-CM | POA: Diagnosis not present

## 2018-03-06 DIAGNOSIS — M2042 Other hammer toe(s) (acquired), left foot: Secondary | ICD-10-CM

## 2018-03-06 DIAGNOSIS — M898X9 Other specified disorders of bone, unspecified site: Secondary | ICD-10-CM | POA: Diagnosis not present

## 2018-03-24 ENCOUNTER — Encounter: Payer: Self-pay | Admitting: Internal Medicine

## 2018-03-24 ENCOUNTER — Ambulatory Visit: Payer: BLUE CROSS/BLUE SHIELD | Admitting: Internal Medicine

## 2018-03-24 ENCOUNTER — Other Ambulatory Visit: Payer: Self-pay | Admitting: Internal Medicine

## 2018-03-24 ENCOUNTER — Other Ambulatory Visit (INDEPENDENT_AMBULATORY_CARE_PROVIDER_SITE_OTHER): Payer: BLUE CROSS/BLUE SHIELD

## 2018-03-24 VITALS — BP 164/100 | HR 61 | Temp 99.2°F | Resp 16 | Ht 67.0 in | Wt 206.0 lb

## 2018-03-24 DIAGNOSIS — J4530 Mild persistent asthma, uncomplicated: Secondary | ICD-10-CM | POA: Diagnosis not present

## 2018-03-24 DIAGNOSIS — I10 Essential (primary) hypertension: Secondary | ICD-10-CM

## 2018-03-24 DIAGNOSIS — E559 Vitamin D deficiency, unspecified: Secondary | ICD-10-CM

## 2018-03-24 LAB — BASIC METABOLIC PANEL
BUN: 20 mg/dL (ref 6–23)
CO2: 28 mEq/L (ref 19–32)
Calcium: 9.6 mg/dL (ref 8.4–10.5)
Chloride: 103 mEq/L (ref 96–112)
Creatinine, Ser: 0.78 mg/dL (ref 0.40–1.50)
GFR: 148.84 mL/min (ref >60.00)
GLUCOSE: 91 mg/dL (ref 70–99)
POTASSIUM: 4.3 meq/L (ref 3.5–5.1)
Sodium: 138 mEq/L (ref 135–145)

## 2018-03-24 LAB — CBC WITH DIFFERENTIAL/PLATELET
BASOS ABS: 0 10*3/uL (ref 0.0–0.1)
Basophils Relative: 0.7 % (ref 0.0–3.0)
EOS ABS: 0.2 10*3/uL (ref 0.0–0.7)
EOS PCT: 4.4 % (ref 0.0–5.0)
HCT: 43.8 % (ref 39.0–52.0)
HEMOGLOBIN: 14.3 g/dL (ref 13.0–17.0)
LYMPHS ABS: 1.5 10*3/uL (ref 0.7–4.0)
Lymphocytes Relative: 36.9 % (ref 12.0–46.0)
MCHC: 32.6 g/dL (ref 30.0–36.0)
MCV: 91 fl (ref 78.0–100.0)
MONO ABS: 0.6 10*3/uL (ref 0.1–1.0)
Monocytes Relative: 15.7 % — ABNORMAL HIGH (ref 3.0–12.0)
Neutro Abs: 1.7 10*3/uL (ref 1.4–7.7)
Neutrophils Relative %: 42.3 % — ABNORMAL LOW (ref 43.0–77.0)
PLATELETS: 213 10*3/uL (ref 150.0–400.0)
RBC: 4.82 Mil/uL (ref 4.22–5.81)
RDW: 13.6 % (ref 11.5–15.5)
WBC: 4.1 10*3/uL (ref 4.0–10.5)

## 2018-03-24 LAB — URINALYSIS, ROUTINE W REFLEX MICROSCOPIC
BILIRUBIN URINE: NEGATIVE
Hgb urine dipstick: NEGATIVE
Ketones, ur: NEGATIVE
LEUKOCYTES UA: NEGATIVE
Nitrite: NEGATIVE
PH: 7 (ref 5.0–8.0)
RBC / HPF: NONE SEEN (ref 0–?)
SPECIFIC GRAVITY, URINE: 1.02 (ref 1.000–1.030)
Total Protein, Urine: NEGATIVE
URINE GLUCOSE: NEGATIVE
Urobilinogen, UA: 0.2 (ref 0.0–1.0)

## 2018-03-24 LAB — VITAMIN D 25 HYDROXY (VIT D DEFICIENCY, FRACTURES): VITD: 23.89 ng/mL — AB (ref 30.00–100.00)

## 2018-03-24 MED ORDER — CHLORTHALIDONE 25 MG PO TABS
25.0000 mg | ORAL_TABLET | Freq: Every day | ORAL | 0 refills | Status: DC
Start: 2018-03-24 — End: 2018-06-17

## 2018-03-24 MED ORDER — NEBIVOLOL HCL 5 MG PO TABS: 5.0000 mg | ORAL_TABLET | Freq: Every day | ORAL | 0 refills | Status: DC

## 2018-03-24 MED ORDER — MOMETASONE FURO-FORMOTEROL FUM 200-5 MCG/ACT IN AERO: 2.0000 | INHALATION_SPRAY | Freq: Two times a day (BID) | RESPIRATORY_TRACT | 1 refills | Status: DC

## 2018-03-24 MED ORDER — CHOLECALCIFEROL 50 MCG (2000 UT) PO TABS
2.0000 | ORAL_TABLET | Freq: Every day | ORAL | 1 refills | Status: DC
Start: 2018-03-24 — End: 2020-01-19

## 2018-03-24 NOTE — Progress Notes (Signed)
Subjective:  Patient ID: Luke Gross, male    DOB: 1985-12-04  Age: 32 y.o. MRN: [id number]  CC: Hypertension and Asthma   HPI Luke Gross presents for f/up - He does not monitor his blood pressure at home.  He does complain that he has had a few headaches recently.  He denies any episodes of blurred vision, chest pain, shortness of breath, cough, or wheezing.  His asthma has been well controlled with his current inhalers.  Outpatient Medications Prior to Visit  Medication Sig Dispense Refill  . albuterol (PROAIR HFA) 108 (90 Base) MCG/ACT inhaler INHALE 1-2 PUFFS INTO THE LUNGS EVERY 6 HOURS AS NEEDED FOR WHEEZING OR SHORTNESS OF BREATH 8.5 g 3  . albuterol (PROVENTIL) (2.5 MG/3ML) 0.083% nebulizer solution USE 1 VIAL VIA NEBULIZER EVERY 4-6 HOURS AS NEEDED FOR COUGH OR WHEEZE 75 mL 5  . albuterol (PROVENTIL) (5 MG/ML) 0.5% nebulizer solution Take 0.5 mLs (2.5 mg total) by nebulization every 6 (six) hours as needed for wheezing or shortness of breath. 20 mL 12  . mometasone-formoterol (DULERA) 200-5 MCG/ACT AERO Inhale 2 puffs into the lungs 2 (two) times daily. 1 Inhaler 0   No facility-administered medications prior to visit.     ROS Review of Systems  Constitutional: Negative.  Negative for diaphoresis, fatigue and fever.  HENT: Negative.   Eyes: Negative for visual disturbance.  Respiratory: Negative for cough, chest tightness, shortness of breath and wheezing.   Cardiovascular: Negative for chest pain, palpitations and leg swelling.  Gastrointestinal: Negative for abdominal pain, diarrhea, nausea and vomiting.  Endocrine: Negative.   Genitourinary: Negative.   Musculoskeletal: Negative.   Skin: Negative.   Neurological: Positive for headaches. Negative for dizziness, facial asymmetry, weakness and light-headedness.  Hematological: Negative for adenopathy. Does not bruise/bleed easily.  Psychiatric/Behavioral: Negative.     Objective:  BP (!) 164/100 (BP  Location: Left Arm, Patient Position: Sitting, Cuff Size: Normal)   Pulse 61   Temp 99.2 F (37.3 C) (Oral)   Resp 16   Ht  (1.702 m)   Wt 206 lb (93.4 kg)   SpO2 97%   BMI 32.26 kg/m   BP Readings from Last 3 Encounters:  03/24/18 (!) 164/100  12/19/16 (!) 154/97  11/26/16 120/80    Wt Readings from Last 3 Encounters:  03/24/18 206 lb (93.4 kg)  11/26/16 236 lb (107 kg)  01/02/16 240 lb (108.9 kg)    Physical Exam  Constitutional: He is oriented to person, place, and time. No distress.  HENT:  Nose: Nose normal.  Mouth/Throat: No oropharyngeal exudate.  Eyes: Conjunctivae are normal. Left eye exhibits no discharge. No scleral icterus.  Neck: Normal range of motion. Neck supple. No JVD present. No thyromegaly present.  Cardiovascular: Normal rate, regular rhythm and normal heart sounds. Exam reveals no gallop and no friction rub.  No murmur heard. Pulmonary/Chest: Effort normal and breath sounds normal. No stridor. No respiratory distress. He has no wheezes. He has no rales.  Abdominal: Soft. Bowel sounds are normal. He exhibits no mass.  Musculoskeletal: Normal range of motion. He exhibits no edema, tenderness or deformity.  Lymphadenopathy:    He has no cervical adenopathy.  Neurological: He is alert and oriented to person, place, and time. He displays normal reflexes. No cranial nerve deficit. He exhibits normal muscle tone. Coordination normal.  Skin: Skin is warm and dry. He is not diaphoretic.  Vitals reviewed.   Lab Results  Component Value Date   WBC  4.1 03/24/2018   HGB 14.3 03/24/2018   HCT 43.8 03/24/2018   PLT 213.0 03/24/2018   GLUCOSE 91 03/24/2018   CHOL 140 11/26/2016   TRIG 45.0 11/26/2016   HDL 37.20 (L) 11/26/2016   LDLCALC 94 11/26/2016   ALT 19 11/26/2016   AST 19 11/26/2016   NA 138 03/24/2018   K 4.3 03/24/2018   CL 103 03/24/2018   CREATININE 0.78 03/24/2018   BUN 20 03/24/2018   CO2 28 03/24/2018   TSH 0.37 11/26/2016    Dg  Finger Little Right  Result Date: 01/02/2016 CLINICAL DATA:  Sprain right little finger 3 weeks ago playing basketball. Pain and swelling. EXAM: RIGHT LITTLE FINGER 2+V COMPARISON:  10/04/2013 FINDINGS: There is a mildly comminuted fracture at the base of the right little finger proximal phalanx with intra-articular extension into the MCP joint. No significant displacement. No subluxation or dislocation. IMPRESSION: Mildly comminuted intra-articular fracture in the proximal phalanx of the right little finger. Electronically Signed   By: Charlett Nose M.D.   On: 01/02/2016 10:55    Assessment & Plan:   Richardson was seen today for hypertension and asthma.  Diagnoses and all orders for this visit:  Essential hypertension, benign- He has stage III hypertension and his blood pressure is not adequately well controlled.  I have asked him to start taking nebivolol and the thiazide diuretic.  I will also treat the vitamin D deficiency.  Will screen him for primary aldosteronism as well.  Otherwise, his labs are negative for secondary causes or endorgan damage. -     VITAMIN D 25 Hydroxy (Vit-D Deficiency, Fractures); Future -     Aldosterone + renin activity w/ ratio; Future -     CBC with Differential/Platelet; Future -     Basic metabolic panel; Future -     Thyroid Panel With TSH; Future -     Urinalysis, Routine w reflex microscopic; Future -     nebivolol (BYSTOLIC) 5 MG tablet; Take 1 tablet (5 mg total) by mouth daily. -     chlorthalidone (HYGROTON) 25 MG tablet; Take 1 tablet (25 mg total) by mouth daily.  Vitamin D deficiency -     VITAMIN D 25 Hydroxy (Vit-D Deficiency, Fractures); Future -     Cholecalciferol 2000 units TABS; Take 2 tablets (4,000 Units total) by mouth daily.  Mild persistent asthma, uncomplicated -     mometasone-formoterol (DULERA) 200-5 MCG/ACT AERO; Inhale 2 puffs into the lungs 2 (two) times daily.   I am having Luke Gross start on nebivolol,  chlorthalidone, and Cholecalciferol. I am also having him maintain his albuterol, albuterol, albuterol, and mometasone-formoterol.  Meds ordered this encounter  Medications  . nebivolol (BYSTOLIC) 5 MG tablet    Sig: Take 1 tablet (5 mg total) by mouth daily.    Dispense:  90 tablet    Refill:  0  . chlorthalidone (HYGROTON) 25 MG tablet    Sig: Take 1 tablet (25 mg total) by mouth daily.    Dispense:  90 tablet    Refill:  0  . mometasone-formoterol (DULERA) 200-5 MCG/ACT AERO    Sig: Inhale 2 puffs into the lungs 2 (two) times daily.    Dispense:  3 Inhaler    Refill:  1  . Cholecalciferol 2000 units TABS    Sig: Take 2 tablets (4,000 Units total) by mouth daily.    Dispense:  180 tablet    Refill:  1     Follow-up:  Return in about 2 months (around 05/24/2018).  Sanda Linger, MD

## 2018-03-24 NOTE — Patient Instructions (Signed)

## 2018-03-29 LAB — THYROID PANEL WITH TSH
Free Thyroxine Index: 2 (ref 1.4–3.8)
T3 Uptake: 29 % (ref 22–35)
T4, Total: 6.8 ug/dL (ref 4.9–10.5)
TSH: 0.65 mIU/L (ref 0.40–4.50)

## 2018-03-29 LAB — ALDOSTERONE + RENIN ACTIVITY W/ RATIO
ALDO / PRA Ratio: 22.2 Ratio (ref 0.9–28.9)
Aldosterone: 6 ng/dL
Renin Activity: 0.27 ng/mL/h (ref 0.25–5.82)

## 2018-05-14 ENCOUNTER — Ambulatory Visit: Payer: BLUE CROSS/BLUE SHIELD | Admitting: Podiatry

## 2018-05-24 NOTE — Progress Notes (Signed)
  Subjective:  Patient ID: Luke GlassingChristopher L Pressly, male    DOB: [birth year],  MRN: [id number]  No chief complaint on file.  32 y.o. male returns for the above complaint.  Reports pain to the outside of both fifth toes.  Objective:  There were no vitals filed for this visit. General AA&O x3. Normal mood and affect.  Vascular Pedal pulses palpable.  Neurologic Epicritic sensation grossly intact.  Dermatologic No open lesions. Skin normal texture and turgor.  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  Lister's corn bilateral fifth toes -Courtesy debridement of lesion today -Discussed that should the wish to have the lesion corrected would benefit from hammertoe correction with possible partial versus total nail removal  Return in about 2 months (around 05/06/2018) for Lister's Corn F/u.

## 2018-05-26 ENCOUNTER — Other Ambulatory Visit (INDEPENDENT_AMBULATORY_CARE_PROVIDER_SITE_OTHER): Payer: BLUE CROSS/BLUE SHIELD

## 2018-05-26 ENCOUNTER — Encounter: Payer: Self-pay | Admitting: Internal Medicine

## 2018-05-26 ENCOUNTER — Ambulatory Visit: Payer: BLUE CROSS/BLUE SHIELD | Admitting: Internal Medicine

## 2018-05-26 VITALS — BP 130/60 | HR 60 | Temp 98.3°F | Resp 16 | Ht 67.0 in | Wt 212.0 lb

## 2018-05-26 DIAGNOSIS — E876 Hypokalemia: Secondary | ICD-10-CM | POA: Diagnosis not present

## 2018-05-26 DIAGNOSIS — I10 Essential (primary) hypertension: Secondary | ICD-10-CM | POA: Diagnosis not present

## 2018-05-26 DIAGNOSIS — T502X5A Adverse effect of carbonic-anhydrase inhibitors, benzothiadiazides and other diuretics, initial encounter: Secondary | ICD-10-CM

## 2018-05-26 LAB — BASIC METABOLIC PANEL
BUN: 21 mg/dL (ref 6–23)
CALCIUM: 8.9 mg/dL (ref 8.4–10.5)
CO2: 28 mEq/L (ref 19–32)
Chloride: 103 mEq/L (ref 96–112)
Creatinine, Ser: 1.02 mg/dL (ref 0.40–1.50)
GFR: 109.09 mL/min (ref >60.00)
Glucose, Bld: 102 mg/dL — ABNORMAL HIGH (ref 70–99)
Potassium: 3.4 mEq/L — ABNORMAL LOW (ref 3.5–5.1)
SODIUM: 138 meq/L (ref 135–145)

## 2018-05-26 MED ORDER — POTASSIUM CHLORIDE CRYS ER 20 MEQ PO TBCR: 20.0000 meq | EXTENDED_RELEASE_TABLET | Freq: Three times a day (TID) | ORAL | 1 refills | Status: DC

## 2018-05-26 NOTE — Patient Instructions (Signed)

## 2018-05-26 NOTE — Progress Notes (Signed)
Subjective:  Patient ID: Devoria GlassingChristopher L Heitmeyer, male    DOB: 1986/01/31  Age: 32 y.o. MRN: [id number]  CC: Hypertension   HPI Devoria GlassingChristopher L Soucy presents for f/up - He tells me his blood pressure has been well controlled and he is doing well on the current regimen.  He denies any recent episodes of dizziness, lightheadedness, palpitations, chest pain, edema, or shortness of breath.  Outpatient Medications Prior to Visit  Medication Sig Dispense Refill  . albuterol (PROAIR HFA) 108 (90 Base) MCG/ACT inhaler INHALE 1-2 PUFFS INTO THE LUNGS EVERY 6 HOURS AS NEEDED FOR WHEEZING OR SHORTNESS OF BREATH 8.5 g 3  . albuterol (PROVENTIL) (2.5 MG/3ML) 0.083% nebulizer solution USE 1 VIAL VIA NEBULIZER EVERY 4-6 HOURS AS NEEDED FOR COUGH OR WHEEZE 75 mL 5  . albuterol (PROVENTIL) (5 MG/ML) 0.5% nebulizer solution Take 0.5 mLs (2.5 mg total) by nebulization every 6 (six) hours as needed for wheezing or shortness of breath. 20 mL 12  . chlorthalidone (HYGROTON) 25 MG tablet Take 1 tablet (25 mg total) by mouth daily. 90 tablet 0  . Cholecalciferol 2000 units TABS Take 2 tablets (4,000 Units total) by mouth daily. 180 tablet 1  . mometasone-formoterol (DULERA) 200-5 MCG/ACT AERO Inhale 2 puffs into the lungs 2 (two) times daily. 3 Inhaler 1  . nebivolol (BYSTOLIC) 5 MG tablet Take 1 tablet (5 mg total) by mouth daily. 90 tablet 0   No facility-administered medications prior to visit.     ROS Review of Systems  Constitutional: Negative for diaphoresis and fatigue.  HENT: Negative.   Eyes: Negative for visual disturbance.  Respiratory: Negative for cough, chest tightness, shortness of breath and wheezing.   Cardiovascular: Negative for chest pain, palpitations and leg swelling.  Gastrointestinal: Negative for abdominal pain, constipation, diarrhea, nausea and vomiting.  Genitourinary: Negative.  Negative for difficulty urinating.  Musculoskeletal: Negative.  Negative for arthralgias and myalgias.    Skin: Negative.  Negative for color change.  Neurological: Negative.  Negative for dizziness, weakness, light-headedness and headaches.  Hematological: Negative for adenopathy. Does not bruise/bleed easily.  Psychiatric/Behavioral: Negative.     Objective:  BP 130/60 (BP Location: Left Arm, Patient Position: Sitting, Cuff Size: Normal)   Pulse 60   Temp 98.3 F (36.8 C) (Oral)   Resp 16   Ht 5\' 7"  (1.702 m)   Wt 212 lb (96.2 kg)   SpO2 96%   BMI 33.20 kg/m   BP Readings from Last 3 Encounters:  05/26/18 130/60  03/24/18 (!) 164/100  12/19/16 (!) 154/97    Wt Readings from Last 3 Encounters:  05/26/18 212 lb (96.2 kg)  03/24/18 206 lb (93.4 kg)  11/26/16 236 lb (107 kg)    Physical Exam  Constitutional: He is oriented to person, place, and time. No distress.  HENT:  Mouth/Throat: Oropharynx is clear and moist. No oropharyngeal exudate.  Eyes: Conjunctivae are normal. No scleral icterus.  Neck: Normal range of motion. Neck supple. No JVD present. No thyromegaly present.  Cardiovascular: Normal rate, regular rhythm and normal heart sounds.  No murmur heard. Pulmonary/Chest: Effort normal and breath sounds normal. He has no wheezes. He has no rales.  Abdominal: Soft. Normal appearance and bowel sounds are normal. He exhibits no mass. There is no hepatosplenomegaly. There is no tenderness.  Musculoskeletal: Normal range of motion. He exhibits no edema, tenderness or deformity.  Lymphadenopathy:    He has no cervical adenopathy.  Neurological: He is alert and oriented to person, place, and  time.  Skin: Skin is warm and dry. No rash noted. He is not diaphoretic. No erythema. No pallor.  Vitals reviewed.   Lab Results  Component Value Date   WBC 4.1 03/24/2018   HGB 14.3 03/24/2018   HCT 43.8 03/24/2018   PLT 213.0 03/24/2018   GLUCOSE 102 (H) 05/26/2018   CHOL 140 11/26/2016   TRIG 45.0 11/26/2016   HDL 37.20 (L) 11/26/2016   LDLCALC 94 11/26/2016   ALT 19  11/26/2016   AST 19 11/26/2016   NA 138 05/26/2018   K 3.4 (L) 05/26/2018   CL 103 05/26/2018   CREATININE 1.02 05/26/2018   BUN 21 05/26/2018   CO2 28 05/26/2018   TSH 0.65 03/24/2018    Dg Finger Little Right  Result Date: 01/02/2016 CLINICAL DATA:  Sprain right little finger 3 weeks ago playing basketball. Pain and swelling. EXAM: RIGHT LITTLE FINGER 2+V COMPARISON:  10/04/2013 FINDINGS: There is a mildly comminuted fracture at the base of the right little finger proximal phalanx with intra-articular extension into the MCP joint. No significant displacement. No subluxation or dislocation. IMPRESSION: Mildly comminuted intra-articular fracture in the proximal phalanx of the right little finger. Electronically Signed   By: Charlett Nose M.D.   On: 01/02/2016 10:55    Assessment & Plan:   Akif was seen today for hypertension.  Diagnoses and all orders for this visit:  Essential hypertension, benign- His blood pressure is adequately well controlled.  He has developed mild hypokalemia will so will start potassium replacement therapy. -     Basic metabolic panel; Future -     potassium chloride SA (K-DUR,KLOR-CON) 20 MEQ tablet; Take 1 tablet (20 mEq total) by mouth 3 (three) times daily.  Diuretic-induced hypokalemia -     potassium chloride SA (K-DUR,KLOR-CON) 20 MEQ tablet; Take 1 tablet (20 mEq total) by mouth 3 (three) times daily.   I am having Namir L. Rovito start on potassium chloride SA. I am also having him maintain his albuterol, albuterol, albuterol, nebivolol, chlorthalidone, mometasone-formoterol, and Cholecalciferol.  Meds ordered this encounter  Medications  . potassium chloride SA (K-DUR,KLOR-CON) 20 MEQ tablet    Sig: Take 1 tablet (20 mEq total) by mouth 3 (three) times daily.    Dispense:  180 tablet    Refill:  1     Follow-up: Return in about 6 months (around 11/26/2018).  Sanda Linger, MD

## 2018-06-17 ENCOUNTER — Other Ambulatory Visit: Payer: Self-pay | Admitting: Internal Medicine

## 2018-06-17 DIAGNOSIS — I10 Essential (primary) hypertension: Secondary | ICD-10-CM

## 2018-09-15 ENCOUNTER — Other Ambulatory Visit: Payer: Self-pay | Admitting: Internal Medicine

## 2018-09-15 DIAGNOSIS — J4530 Mild persistent asthma, uncomplicated: Secondary | ICD-10-CM

## 2018-09-15 DIAGNOSIS — I10 Essential (primary) hypertension: Secondary | ICD-10-CM

## 2018-11-01 ENCOUNTER — Encounter: Payer: Self-pay | Admitting: Podiatry

## 2018-11-01 ENCOUNTER — Encounter: Payer: Self-pay | Admitting: Internal Medicine

## 2018-11-02 ENCOUNTER — Encounter: Payer: Self-pay | Admitting: Family Medicine

## 2018-11-02 ENCOUNTER — Ambulatory Visit: Payer: BLUE CROSS/BLUE SHIELD | Admitting: Family Medicine

## 2018-11-02 DIAGNOSIS — J4530 Mild persistent asthma, uncomplicated: Secondary | ICD-10-CM

## 2018-11-02 MED ORDER — PREDNISONE 50 MG PO TABS: 50.0000 mg | ORAL_TABLET | Freq: Every day | ORAL | 0 refills | Status: DC

## 2018-11-02 NOTE — Progress Notes (Signed)
Luke GlassingChristopher L Ritson - 32 y.o. male MRN [id number]  Date of birth: Dec 26, 1985  SUBJECTIVE:  Including CC & ROS.  Chief Complaint  Patient presents with  . Cough    wheezing  1 month     Luke GlassingChristopher L Heiden is a 32 y.o. male that is has been having some coughing and wheezing.  He has noticed this for a few weeks now.  He has a history of asthma.  Denies any severe exacerbation requiring hospitalization.  Denies any fevers or chills.  Feels like his symptoms are worse at night.   Review of Systems  Constitutional: Negative for fever.  HENT: Negative for congestion.   Respiratory: Positive for cough.   Cardiovascular: Negative for chest pain.  Gastrointestinal: Negative for abdominal distention.    HISTORY: Past Medical, Surgical, Social, and Family History Reviewed & Updated per EMR.   Pertinent Historical Findings include:  Past Medical History:  Diagnosis Date  . Allergy   . Asthma   . Hypertension     No past surgical history on file.  Allergies  Allergen Reactions  . Lisinopril     cough    Family History  Problem Relation Age of Onset  . Hypertension Mother   . Stroke Neg Hx   . Kidney disease Neg Hx   . Hyperlipidemia Neg Hx   . Heart disease Neg Hx   . Hearing loss Neg Hx   . Early death Neg Hx   . Drug abuse Neg Hx   . Diabetes Neg Hx   . Cancer Neg Hx   . Alcohol abuse Neg Hx      Social History   Socioeconomic History  . Marital status: Single    Spouse name: Not on file  . Number of children: Not on file  . Years of education: Not on file  . Highest education level: Not on file  Occupational History  . Not on file  Social Needs  . Financial resource strain: Not on file  . Food insecurity:    Worry: Not on file    Inability: Not on file  . Transportation needs:    Medical: Not on file    Non-medical: Not on file  Tobacco Use  . Smoking status: Former Smoker    Last attempt to quit: 04/05/2012    Years since quitting: 6.5  . Smokeless  tobacco: Never Used  Substance and Sexual Activity  . Alcohol use: Yes    Alcohol/week: 3.0 standard drinks    Types: 3 Glasses of wine per week  . Drug use: No  . Sexual activity: Yes    Birth control/protection: None  Lifestyle  . Physical activity:    Days per week: Not on file    Minutes per session: Not on file  . Stress: Not on file  Relationships  . Social connections:    Talks on phone: Not on file    Gets together: Not on file    Attends religious service: Not on file    Active member of club or organization: Not on file    Attends meetings of clubs or organizations: Not on file    Relationship status: Not on file  . Intimate partner violence:    Fear of current or ex partner: Not on file    Emotionally abused: Not on file    Physically abused: Not on file    Forced sexual activity: Not on file  Other Topics Concern  . Not on file  Social  History Narrative  . Not on file     PHYSICAL EXAM:  VS: BP (!) 160/100   Pulse 68   Temp 98.1 F (36.7 C) (Oral)   Ht 5\' 7"  (1.702 m)   Wt 216 lb 3.2 oz (98.1 kg)   SpO2 96%   BMI 33.86 kg/m  Physical Exam Gen: NAD, alert, cooperative with exam, well-appearing ENT: normal lips, normal nasal mucosa,  Eye: normal EOM, normal conjunctiva and lids CV:  no edema, +2 pedal pulses   Resp: no accessory muscle use, non-labored, mild end expiratory wheezing Skin: no rashes, no areas of induration  Neuro: normal tone, normal sensation to touch Psych:  normal insight, alert and oriented MSK: Normal gait, normal strength     ASSESSMENT & PLAN:   Mild persistent asthma Likely symptoms today are due to a mild exacerbation.  Possibly triggered by a virus. -Prednisone. -Given indications to follow-up

## 2018-11-02 NOTE — Patient Instructions (Signed)
Nice to meet you  Please try the prednisone  Please try using the albuterol inhaler every 4-6 hours over the next day  Please follow up if you feel like your symptoms are not improving  Happy New year.

## 2018-11-05 NOTE — Assessment & Plan Note (Signed)
Likely symptoms today are due to a mild exacerbation.  Possibly triggered by a virus. -Prednisone. -Given indications to follow-up

## 2018-12-11 ENCOUNTER — Other Ambulatory Visit: Payer: Self-pay | Admitting: Internal Medicine

## 2018-12-11 DIAGNOSIS — J4530 Mild persistent asthma, uncomplicated: Secondary | ICD-10-CM

## 2018-12-15 ENCOUNTER — Telehealth: Payer: Self-pay

## 2018-12-15 NOTE — Telephone Encounter (Signed)
Key: [id number]

## 2018-12-15 NOTE — Telephone Encounter (Signed)
PA for [employer] requested.   Contacted pt and requested new insurance information  [employer] [place] health plan  ID: [id number] BIN [id number] GRP [id number] PCN

## 2019-02-16 ENCOUNTER — Telehealth: Payer: Self-pay | Admitting: Internal Medicine

## 2019-02-16 ENCOUNTER — Encounter: Payer: Self-pay | Admitting: Internal Medicine

## 2019-02-16 ENCOUNTER — Other Ambulatory Visit: Payer: Self-pay | Admitting: Family Medicine

## 2019-02-16 ENCOUNTER — Other Ambulatory Visit: Payer: Self-pay | Admitting: Internal Medicine

## 2019-02-16 DIAGNOSIS — J4530 Mild persistent asthma, uncomplicated: Secondary | ICD-10-CM

## 2019-02-16 MED ORDER — PREDNISONE 50 MG PO TABS: 50.0000 mg | ORAL_TABLET | Freq: Every day | ORAL | 0 refills | Status: DC

## 2019-02-16 MED ORDER — FLUTICASONE-SALMETEROL 500-50 MCG/DOSE IN AEPB: 1.0000 | INHALATION_SPRAY | Freq: Two times a day (BID) | RESPIRATORY_TRACT | 1 refills | Status: DC

## 2019-02-16 MED ORDER — BUDESONIDE-FORMOTEROL FUMARATE 160-4.5 MCG/ACT IN AERO: 2.0000 | INHALATION_SPRAY | Freq: Two times a day (BID) | RESPIRATORY_TRACT | 1 refills | Status: DC

## 2019-02-16 NOTE — Telephone Encounter (Signed)
Patient called and left a message stating his insurance will no longer cover Dulera inhaler. Advair Diskus, Advair HFA, Breo Ellipta or Symbicort please advise.

## 2019-02-16 NOTE — Telephone Encounter (Signed)
Ok to Rf? Last Rx'd by Dr. Jordan Likes 11/02/18.

## 2019-02-18 ENCOUNTER — Ambulatory Visit (INDEPENDENT_AMBULATORY_CARE_PROVIDER_SITE_OTHER): Payer: Self-pay | Admitting: Internal Medicine

## 2019-02-18 DIAGNOSIS — E876 Hypokalemia: Secondary | ICD-10-CM

## 2019-02-18 DIAGNOSIS — T502X5A Adverse effect of carbonic-anhydrase inhibitors, benzothiadiazides and other diuretics, initial encounter: Secondary | ICD-10-CM

## 2019-02-18 DIAGNOSIS — I10 Essential (primary) hypertension: Secondary | ICD-10-CM

## 2019-02-18 DIAGNOSIS — E559 Vitamin D deficiency, unspecified: Secondary | ICD-10-CM

## 2019-02-18 DIAGNOSIS — J4530 Mild persistent asthma, uncomplicated: Secondary | ICD-10-CM

## 2019-02-18 MED ORDER — CARVEDILOL 3.125 MG PO TABS: 3.1250 mg | ORAL_TABLET | Freq: Two times a day (BID) | ORAL | 1 refills | Status: DC

## 2019-02-18 NOTE — Progress Notes (Signed)
Virtual Visit via Video Note  I connected with Luke Gross on 02/18/19 at 10:00 AM EDT by a video enabled telemedicine application and verified that I am speaking with the correct person using two identifiers.   I discussed the limitations of evaluation and management by telemedicine and the availability of in person appointments. The patient expressed understanding and agreed to proceed.  History of Present Illness: He checked in for virtual visit.  He was not willing to come in because of the COVID-19 pandemic.  He has had some trouble with his asthma medication recently.  His insurance company would no longer cover Dulera so we had to send in a prescription for an alternative.  It looks like they cover generic Advair.  He also recently had a flareup of asthma so he has taken a course of oral prednisone.  He is feeling much better.  He has mild nasal congestion but denies cough, wheezing, fever, chills, chest pain, or shortness of breath.  He also tells me his blood pressure has been well controlled.    Observations/Objective: He had some nasal congestion and rhinitis but he was in no acute distress.  There was no coughing, wheezing, or labored breathing.  His respiratory rate appeared normal.  He was calm, cooperative and appropriate.  Lab Results  Component Value Date   WBC 4.1 03/24/2018   HGB 14.3 03/24/2018   HCT 43.8 03/24/2018   PLT 213.0 03/24/2018   GLUCOSE 102 (H) 05/26/2018   CHOL 140 11/26/2016   TRIG 45.0 11/26/2016   HDL 37.20 (L) 11/26/2016   LDLCALC 94 11/26/2016   ALT 19 11/26/2016   AST 19 11/26/2016   NA 138 05/26/2018   K 3.4 (L) 05/26/2018   CL 103 05/26/2018   CREATININE 1.02 05/26/2018   BUN 21 05/26/2018   CO2 28 05/26/2018   TSH 0.65 03/24/2018     Assessment and Plan: He had a recent asthma exacerbation which has improved with a course of oral steroids.  He is now using a generic LABA/ICS inhaler.  His asthma now seems to be well controlled.  He  also tells me his blood pressure has been well controlled.  I have asked him to come in in the next few months for a blood pressure check and to monitor his electrolytes and renal function.   Follow Up Instructions: He agrees with the above plans.  He will let me know if he develops any new or worsening symptoms.  His Rx's have been ordered to cover his medical conditions.    I discussed the assessment and treatment plan with the patient. The patient was provided an opportunity to ask questions and all were answered. The patient agreed with the plan and demonstrated an understanding of the instructions.   The patient was advised to call back or seek an in-person evaluation if the symptoms worsen or if the condition fails to improve as anticipated.  I provided 25 minutes of non-face-to-face time during this encounter.   Sanda Linger, MD

## 2019-02-22 ENCOUNTER — Encounter: Payer: Self-pay | Admitting: Internal Medicine

## 2019-03-26 ENCOUNTER — Other Ambulatory Visit (INDEPENDENT_AMBULATORY_CARE_PROVIDER_SITE_OTHER): Payer: Self-pay

## 2019-03-26 DIAGNOSIS — E876 Hypokalemia: Secondary | ICD-10-CM

## 2019-03-26 DIAGNOSIS — I10 Essential (primary) hypertension: Secondary | ICD-10-CM

## 2019-03-26 DIAGNOSIS — E559 Vitamin D deficiency, unspecified: Secondary | ICD-10-CM

## 2019-03-26 DIAGNOSIS — T502X5A Adverse effect of carbonic-anhydrase inhibitors, benzothiadiazides and other diuretics, initial encounter: Secondary | ICD-10-CM

## 2019-03-26 LAB — MAGNESIUM: Magnesium: 1.9 mg/dL (ref 1.5–2.5)

## 2019-03-26 LAB — BASIC METABOLIC PANEL
BUN: 17 mg/dL (ref 6–23)
CO2: 25 mEq/L (ref 19–32)
Calcium: 9.3 mg/dL (ref 8.4–10.5)
Chloride: 105 mEq/L (ref 96–112)
Creatinine, Ser: 0.87 mg/dL (ref 0.40–1.50)
GFR: 122.67 mL/min (ref >60.00)
Glucose, Bld: 86 mg/dL (ref 70–99)
Potassium: 3.9 mEq/L (ref 3.5–5.1)
Sodium: 139 mEq/L (ref 135–145)

## 2019-03-26 LAB — VITAMIN D 25 HYDROXY (VIT D DEFICIENCY, FRACTURES): VITD: 32.06 ng/mL (ref 30.00–100.00)

## 2019-03-27 ENCOUNTER — Other Ambulatory Visit: Payer: Self-pay | Admitting: Internal Medicine

## 2019-03-27 DIAGNOSIS — J4530 Mild persistent asthma, uncomplicated: Secondary | ICD-10-CM

## 2019-03-28 ENCOUNTER — Encounter: Payer: Self-pay | Admitting: Internal Medicine

## 2019-04-01 ENCOUNTER — Ambulatory Visit (INDEPENDENT_AMBULATORY_CARE_PROVIDER_SITE_OTHER): Payer: BC Managed Care – PPO | Admitting: Internal Medicine

## 2019-04-01 ENCOUNTER — Other Ambulatory Visit: Payer: Self-pay

## 2019-04-01 ENCOUNTER — Encounter: Payer: Self-pay | Admitting: Internal Medicine

## 2019-04-01 ENCOUNTER — Ambulatory Visit (INDEPENDENT_AMBULATORY_CARE_PROVIDER_SITE_OTHER)
Admission: RE | Admit: 2019-04-01 | Discharge: 2019-04-01 | Disposition: A | Discharge status: Home / Self Care | Payer: BC Managed Care – PPO | Source: Ambulatory Visit | Attending: Internal Medicine | Admitting: Internal Medicine

## 2019-04-01 VITALS — BP 148/76 | HR 66 | Temp 99.3°F | Resp 20 | Ht 67.0 in | Wt 220.0 lb

## 2019-04-01 DIAGNOSIS — R059 Cough, unspecified: Secondary | ICD-10-CM

## 2019-04-01 DIAGNOSIS — J4530 Mild persistent asthma, uncomplicated: Secondary | ICD-10-CM

## 2019-04-01 DIAGNOSIS — I10 Essential (primary) hypertension: Secondary | ICD-10-CM | POA: Diagnosis not present

## 2019-04-01 DIAGNOSIS — R05 Cough: Secondary | ICD-10-CM | POA: Diagnosis not present

## 2019-04-01 DIAGNOSIS — J988 Other specified respiratory disorders: Secondary | ICD-10-CM

## 2019-04-01 MED ORDER — FLUTICASONE-SALMETEROL 500-50 MCG/DOSE IN AEPB: 1.0000 | INHALATION_SPRAY | Freq: Two times a day (BID) | RESPIRATORY_TRACT | 1 refills | Status: DC

## 2019-04-01 MED ORDER — CHLORTHALIDONE 25 MG PO TABS: ORAL_TABLET | ORAL | 0 refills | Status: DC

## 2019-04-01 MED ORDER — AZITHROMYCIN 500 MG PO TABS: 500.0000 mg | ORAL_TABLET | Freq: Every day | ORAL | 0 refills | Status: AC

## 2019-04-01 MED ORDER — MONTELUKAST SODIUM 10 MG PO TABS: 10.0000 mg | ORAL_TABLET | Freq: Every day | ORAL | 1 refills | Status: DC

## 2019-04-01 NOTE — Patient Instructions (Signed)

## 2019-04-01 NOTE — Progress Notes (Signed)
Subjective:  Patient ID: Luke Gross, male    DOB: Aug 03, 1986  Age: 33 y.o. MRN: [id number]  CC: Cough; Asthma; and Hypertension   HPI Luke Gross presents for f/up - He was recently seen in the ED for an asthma exacerbation.  He was prescribed prednisone and is starting to feel somewhat better.  He continues to complain of cough that is productive of yellow phlegm with wheezing.  A few months ago I was notified by his insurance co that they would no longer [facility] so I changed his prescription to a generic combination of fluticasone and salmeterol.  He has not picked up the new prescription from the pharmacy and instead has been using the [facility].  He does not monitor his blood pressure.  He thinks he is taking the carvedilol but he is pretty sure he is not taking chlorthalidone.  He denies any recent episodes of headache, blurred vision, chest pain, shortness of breath, or edema.  Outpatient Medications Prior to Visit  Medication Sig Dispense Refill  . albuterol (PROVENTIL HFA;VENTOLIN HFA) 108 (90 Base) MCG/ACT inhaler INHALE 1 TO 2 PUFFS INTO THE LUNGS EVERY 6 HOURS AS NEEDED FOR WHEEZING OR SHORTNESS OF BREATH 8.5 g 3  . albuterol (PROVENTIL) (2.5 MG/3ML) 0.083% nebulizer solution USE 1 VIAL VIA NEBULIZER EVERY 4-6 HOURS AS NEEDED FOR COUGH OR WHEEZE 75 mL 5  . albuterol (PROVENTIL) (5 MG/ML) 0.5% nebulizer solution Take 0.5 mLs (2.5 mg total) by nebulization every 6 (six) hours as needed for wheezing or shortness of breath. 20 mL 12  . carvedilol (COREG) 3.125 MG tablet Take 1 tablet (3.125 mg total) by mouth 2 (two) times daily with a meal. 180 tablet 1  . Cholecalciferol 2000 units TABS Take 2 tablets (4,000 Units total) by mouth daily. 180 tablet 1  . potassium chloride SA (K-DUR,KLOR-CON) 20 MEQ tablet Take 1 tablet (20 mEq total) by mouth 3 (three) times daily. 180 tablet 1  . predniSONE (DELTASONE) 50 MG tablet Take 1 tablet (50 mg total) by mouth  daily with breakfast. 5 tablet 0  . chlorthalidone (HYGROTON) 25 MG tablet TAKE 1 TABLET(25 MG) BY MOUTH DAILY (Patient not taking: Reported on 04/01/2019) 90 tablet 0  . Fluticasone-Salmeterol (WIXELA INHUB) 500-50 MCG/DOSE AEPB Inhale 1 puff into the lungs 2 (two) times daily. (Patient not taking: Reported on 04/01/2019) 180 each 1   No facility-administered medications prior to visit.     ROS Review of Systems  Constitutional: Negative.  Negative for chills, diaphoresis, fatigue and fever.  HENT: Negative for facial swelling, sinus pressure, sore throat and voice change.   Respiratory: Positive for cough, shortness of breath and wheezing. Negative for chest tightness.   Cardiovascular: Negative for chest pain, palpitations and leg swelling.  Gastrointestinal: Negative for abdominal pain, constipation, diarrhea, nausea and vomiting.  Endocrine: Negative.   Genitourinary: Negative.  Negative for difficulty urinating.  Musculoskeletal: Negative.  Negative for arthralgias and myalgias.  Skin: Negative.  Negative for color change and pallor.  Neurological: Negative.  Negative for dizziness, weakness and light-headedness.  Hematological: Negative for adenopathy. Does not bruise/bleed easily.  Psychiatric/Behavioral: Negative.     Objective:  BP (!) 148/76 (BP Location: Left Arm, Patient Position: Sitting, Cuff Size: Normal)   Pulse 66   Temp 99.3 F (37.4 C) (Oral)   Resp 20   Ht 5\' 7"  (1.702 m)   Wt 220 lb (99.8 kg)   SpO2 98%   BMI 34.46 kg/m  BP Readings from Last 3 Encounters:  04/01/19 (!) 148/76  11/02/18 (!) 160/100  05/26/18 130/60    Wt Readings from Last 3 Encounters:  04/01/19 220 lb (99.8 kg)  11/02/18 216 lb 3.2 oz (98.1 kg)  05/26/18 212 lb (96.2 kg)    Physical Exam Vitals signs reviewed.  Constitutional:      General: He is not in acute distress.    Appearance: He is not ill-appearing, toxic-appearing or diaphoretic.  HENT:     Nose: Nose normal. No  congestion.     Mouth/Throat:     Mouth: Mucous membranes are moist.     Pharynx: No oropharyngeal exudate or posterior oropharyngeal erythema.  Eyes:     General: No scleral icterus.    Conjunctiva/sclera: Conjunctivae normal.  Neck:     Musculoskeletal: Normal range of motion. No neck rigidity.  Cardiovascular:     Rate and Rhythm: Normal rate and regular rhythm.     Heart sounds: No murmur. No gallop.   Pulmonary:     Effort: Pulmonary effort is normal. No respiratory distress.     Breath sounds: No stridor. Examination of the right-upper field reveals wheezing. Examination of the left-upper field reveals wheezing. Examination of the right-middle field reveals wheezing. Examination of the left-middle field reveals wheezing. Examination of the right-lower field reveals wheezing. Examination of the left-lower field reveals wheezing. Wheezing present. No decreased breath sounds, rhonchi or rales.     Comments: There are faint, bilateral, inspiratory and expiratory wheezes.  There is good air movement. Abdominal:     General: Abdomen is protuberant. Bowel sounds are normal.     Palpations: Abdomen is soft. There is no hepatomegaly or splenomegaly.     Tenderness: There is no abdominal tenderness.     Hernia: No hernia is present.  Musculoskeletal: Normal range of motion.        General: No swelling.     Right lower leg: No edema.     Left lower leg: No edema.  Lymphadenopathy:     Cervical: No cervical adenopathy.  Skin:    General: Skin is warm and dry.  Neurological:     General: No focal deficit present.  Psychiatric:        Mood and Affect: Mood normal.        Behavior: Behavior normal.     Lab Results  Component Value Date   WBC 4.1 03/24/2018   HGB 14.3 03/24/2018   HCT 43.8 03/24/2018   PLT 213.0 03/24/2018   GLUCOSE 86 03/26/2019   CHOL 140 11/26/2016   TRIG 45.0 11/26/2016   HDL 37.20 (L) 11/26/2016   LDLCALC 94 11/26/2016   ALT 19 11/26/2016   AST 19  11/26/2016   NA 139 03/26/2019   K 3.9 03/26/2019   CL 105 03/26/2019   CREATININE 0.87 03/26/2019   BUN 17 03/26/2019   CO2 25 03/26/2019   TSH 0.65 03/24/2018    Dg Finger Little Right  Result Date: 01/02/2016 CLINICAL DATA:  Sprain right little finger 3 weeks ago playing basketball. Pain and swelling. EXAM: RIGHT LITTLE FINGER 2+V COMPARISON:  10/04/2013 FINDINGS: There is a mildly comminuted fracture at the base of the right little finger proximal phalanx with intra-articular extension into the MCP joint. No significant displacement. No subluxation or dislocation. IMPRESSION: Mildly comminuted intra-articular fracture in the proximal phalanx of the right little finger. Electronically Signed   By: Charlett Nose M.D.   On: 01/02/2016 10:55   Dg Chest 2  View  Result Date: 04/01/2019 CLINICAL DATA:  Cough EXAM: CHEST - 2 VIEW COMPARISON:  08/03/2009 FINDINGS: The heart size and mediastinal contours are within normal limits. Both lungs are clear. The visualized skeletal structures are unremarkable. IMPRESSION: No active cardiopulmonary disease. Electronically Signed   By: Katherine Mantle M.D.   On: 04/01/2019 20:55     Assessment & Plan:   Aceson was seen today for cough, asthma and hypertension.  Diagnoses and all orders for this visit:  Mild persistent asthma without complication- He has had another exacerbation and his asthma is not adequately well controlled.  I have asked him to start using fluticasone/salmeterol consistently, twice a day.  I have asked him to complete the course of prednisone.  I have asked him to add montelukast as well. -     Fluticasone-Salmeterol (WIXELA INHUB) 500-50 MCG/DOSE AEPB; Inhale 1 puff into the lungs 2 (two) times daily. -     montelukast (SINGULAIR) 10 MG tablet; Take 1 tablet (10 mg total) by mouth at bedtime.  Essential hypertension, benign- His blood pressure is not adequately well controlled.  I have asked him to start taking  chlorthalidone in addition to the carvedilol. -     chlorthalidone (HYGROTON) 25 MG tablet; TAKE 1 TABLET(25 MG) BY MOUTH DAILY  Cough- His chest x-ray is negative for mass or infiltrate. -     DG Chest 2 View; Future  RTI (respiratory tract infection) -     azithromycin (ZITHROMAX) 500 MG tablet; Take 1 tablet (500 mg total) by mouth daily for 3 days.   I am having Sonny L. Daphine Deutscher "Thayer Ohm" start on azithromycin and montelukast. I am also having him maintain his albuterol, albuterol, Cholecalciferol, potassium chloride SA, albuterol, predniSONE, carvedilol, chlorthalidone, and Fluticasone-Salmeterol.  Meds ordered this encounter  Medications  . chlorthalidone (HYGROTON) 25 MG tablet    Sig: TAKE 1 TABLET(25 MG) BY MOUTH DAILY    Dispense:  90 tablet    Refill:  0  . Fluticasone-Salmeterol (WIXELA INHUB) 500-50 MCG/DOSE AEPB    Sig: Inhale 1 puff into the lungs 2 (two) times daily.    Dispense:  180 each    Refill:  1  . azithromycin (ZITHROMAX) 500 MG tablet    Sig: Take 1 tablet (500 mg total) by mouth daily for 3 days.    Dispense:  3 tablet    Refill:  0  . montelukast (SINGULAIR) 10 MG tablet    Sig: Take 1 tablet (10 mg total) by mouth at bedtime.    Dispense:  90 tablet    Refill:  1     Follow-up: Return in about 3 months (around 07/02/2019).  Sanda Linger, MD

## 2019-04-02 ENCOUNTER — Encounter: Payer: Self-pay | Admitting: Internal Medicine

## 2019-04-12 ENCOUNTER — Ambulatory Visit: Payer: Self-pay | Admitting: Internal Medicine

## 2019-04-13 ENCOUNTER — Other Ambulatory Visit: Payer: Self-pay

## 2019-04-13 ENCOUNTER — Ambulatory Visit (INDEPENDENT_AMBULATORY_CARE_PROVIDER_SITE_OTHER): Payer: BC Managed Care – PPO | Admitting: Internal Medicine

## 2019-04-13 ENCOUNTER — Encounter: Payer: Self-pay | Admitting: Internal Medicine

## 2019-04-13 VITALS — BP 148/62 | HR 82 | Temp 98.1°F | Resp 16 | Ht 67.0 in | Wt 218.0 lb

## 2019-04-13 DIAGNOSIS — J4530 Mild persistent asthma, uncomplicated: Secondary | ICD-10-CM | POA: Diagnosis not present

## 2019-04-13 DIAGNOSIS — I10 Essential (primary) hypertension: Secondary | ICD-10-CM | POA: Diagnosis not present

## 2019-04-13 NOTE — Patient Instructions (Signed)

## 2019-04-13 NOTE — Progress Notes (Signed)
Subjective:  Patient ID: Luke Gross, male    DOB: 03-Jan-1986  Age: 33 y.o. MRN: [id number]  CC: Hypertension and Asthma   HPI Luke Gross presents for f/up - He tells me his asthma has come under good control since I last saw him.  He thinks the addition of montelukast has made a big difference.  He is in the process of completing the course of prednisone.  Outpatient Medications Prior to Visit  Medication Sig Dispense Refill  . albuterol (PROVENTIL HFA;VENTOLIN HFA) 108 (90 Base) MCG/ACT inhaler INHALE 1 TO 2 PUFFS INTO THE LUNGS EVERY 6 HOURS AS NEEDED FOR WHEEZING OR SHORTNESS OF BREATH 8.5 g 3  . albuterol (PROVENTIL) (2.5 MG/3ML) 0.083% nebulizer solution USE 1 VIAL VIA NEBULIZER EVERY 4-6 HOURS AS NEEDED FOR COUGH OR WHEEZE 75 mL 5  . albuterol (PROVENTIL) (5 MG/ML) 0.5% nebulizer solution Take 0.5 mLs (2.5 mg total) by nebulization every 6 (six) hours as needed for wheezing or shortness of breath. 20 mL 12  . carvedilol (COREG) 3.125 MG tablet Take 1 tablet (3.125 mg total) by mouth 2 (two) times daily with a meal. 180 tablet 1  . chlorthalidone (HYGROTON) 25 MG tablet TAKE 1 TABLET(25 MG) BY MOUTH DAILY 90 tablet 0  . Cholecalciferol 2000 units TABS Take 2 tablets (4,000 Units total) by mouth daily. 180 tablet 1  . Fluticasone-Salmeterol (WIXELA INHUB) 500-50 MCG/DOSE AEPB Inhale 1 puff into the lungs 2 (two) times daily. 180 each 1  . montelukast (SINGULAIR) 10 MG tablet Take 1 tablet (10 mg total) by mouth at bedtime. 90 tablet 1  . potassium chloride SA (K-DUR,KLOR-CON) 20 MEQ tablet Take 1 tablet (20 mEq total) by mouth 3 (three) times daily. 180 tablet 1  . predniSONE (DELTASONE) 10 MG tablet     . predniSONE (DELTASONE) 50 MG tablet Take 1 tablet (50 mg total) by mouth daily with breakfast. 5 tablet 0   No facility-administered medications prior to visit.     ROS Review of Systems  Constitutional: Negative.  Negative for chills, fatigue and fever.   HENT: Negative.   Eyes: Negative.  Negative for visual disturbance.  Respiratory: Negative for cough, chest tightness, shortness of breath and wheezing.   Cardiovascular: Negative for chest pain, palpitations and leg swelling.  Gastrointestinal: Negative for abdominal pain, diarrhea, nausea and vomiting.  Endocrine: Negative.   Genitourinary: Negative.  Negative for difficulty urinating.  Musculoskeletal: Negative.  Negative for arthralgias and myalgias.  Skin: Negative.  Negative for color change, pallor and rash.  Neurological: Negative.  Negative for dizziness, weakness and light-headedness.  Hematological: Negative for adenopathy. Does not bruise/bleed easily.  Psychiatric/Behavioral: Negative.     Objective:  BP (!) 148/62 (BP Location: Left Arm, Patient Position: Sitting, Cuff Size: Large)   Pulse 82   Temp 98.1 F (36.7 C) (Oral)   Resp 16   Ht 5\' 7"  (1.702 m)   Wt 218 lb (98.9 kg)   SpO2 96%   BMI 34.14 kg/m   BP Readings from Last 3 Encounters:  04/13/19 (!) 148/62  04/01/19 (!) 148/76  11/02/18 (!) 160/100    Wt Readings from Last 3 Encounters:  04/13/19 218 lb (98.9 kg)  04/01/19 220 lb (99.8 kg)  11/02/18 216 lb 3.2 oz (98.1 kg)    Physical Exam Vitals signs reviewed.  Constitutional:      Appearance: He is not ill-appearing or diaphoretic.  HENT:     Nose: Nose normal.  Mouth/Throat:     Mouth: Mucous membranes are moist.     Pharynx: No oropharyngeal exudate or posterior oropharyngeal erythema.  Eyes:     Conjunctiva/sclera: Conjunctivae normal.  Neck:     Musculoskeletal: Normal range of motion and neck supple. No neck rigidity.  Cardiovascular:     Rate and Rhythm: Normal rate and regular rhythm.     Heart sounds: No murmur. No gallop.   Pulmonary:     Effort: Pulmonary effort is normal.     Breath sounds: No stridor. No wheezing, rhonchi or rales.  Abdominal:     General: Abdomen is flat.     Palpations: There is no mass.      Tenderness: There is no abdominal tenderness. There is no guarding.  Musculoskeletal: Normal range of motion.        General: No swelling.     Right lower leg: No edema.     Left lower leg: No edema.  Lymphadenopathy:     Cervical: No cervical adenopathy.  Skin:    General: Skin is warm and dry.     Coloration: Skin is not pale.  Neurological:     General: No focal deficit present.     Mental Status: Mental status is at baseline.  Psychiatric:        Mood and Affect: Mood normal.        Behavior: Behavior normal.     Lab Results  Component Value Date   WBC 4.1 03/24/2018   HGB 14.3 03/24/2018   HCT 43.8 03/24/2018   PLT 213.0 03/24/2018   GLUCOSE 86 03/26/2019   CHOL 140 11/26/2016   TRIG 45.0 11/26/2016   HDL 37.20 (L) 11/26/2016   LDLCALC 94 11/26/2016   ALT 19 11/26/2016   AST 19 11/26/2016   NA 139 03/26/2019   K 3.9 03/26/2019   CL 105 03/26/2019   CREATININE 0.87 03/26/2019   BUN 17 03/26/2019   CO2 25 03/26/2019   TSH 0.65 03/24/2018    Dg Chest 2 View  Result Date: 04/01/2019 CLINICAL DATA:  Cough EXAM: CHEST - 2 VIEW COMPARISON:  08/03/2009 FINDINGS: The heart size and mediastinal contours are within normal limits. Both lungs are clear. The visualized skeletal structures are unremarkable. IMPRESSION: No active cardiopulmonary disease. Electronically Signed   By: Katherine Mantlehristopher  Green M.D.   On: 04/01/2019 20:55    Assessment & Plan:   Cristal DeerChristopher was seen today for hypertension and asthma.  Diagnoses and all orders for this visit:  Essential hypertension, benign- His blood pressure is better but still not adequately well controlled.  I have asked him to continue the current regimen and to improve his lifestyle modifications.  Mild persistent asthma without complication- His asthma is currently well controlled.  I have asked him to be compliant with the use of the ICS/LABA inhaler as well as montelukast.   I am having Jenkins L. Daphine DeutscherMartin "Thayer OhmChris"  maintain his albuterol, albuterol, Cholecalciferol, potassium chloride SA, albuterol, carvedilol, chlorthalidone, Fluticasone-Salmeterol, montelukast, and predniSONE.  No orders of the defined types were placed in this encounter.    Follow-up: Return in about 4 months (around 08/13/2019).  Sanda Lingerhomas , MD

## 2019-06-21 IMAGING — DX CHEST - 2 VIEW
2 series · 2 of 2 positions shown · non-contrast
Comparison: 08/03/2009

CLINICAL DATA: Cough

EXAM:
CHEST - 2 VIEW

[chest pa]
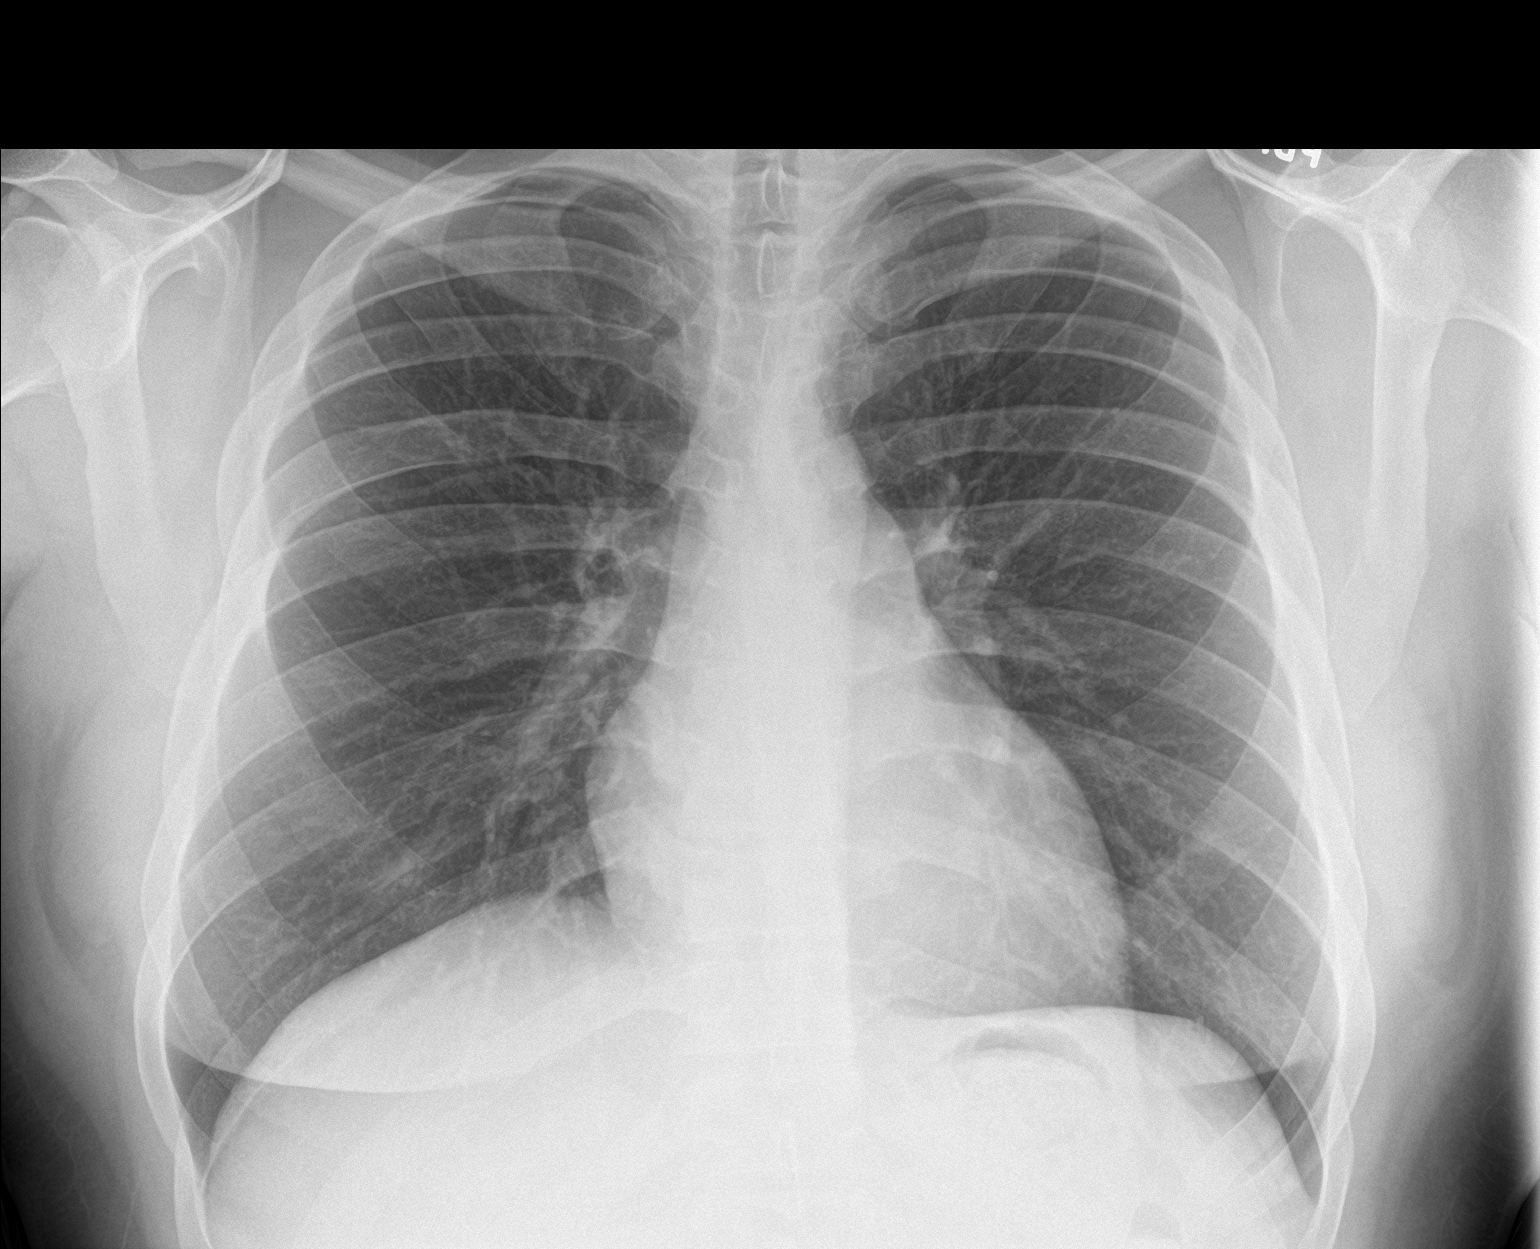

[chest lat]
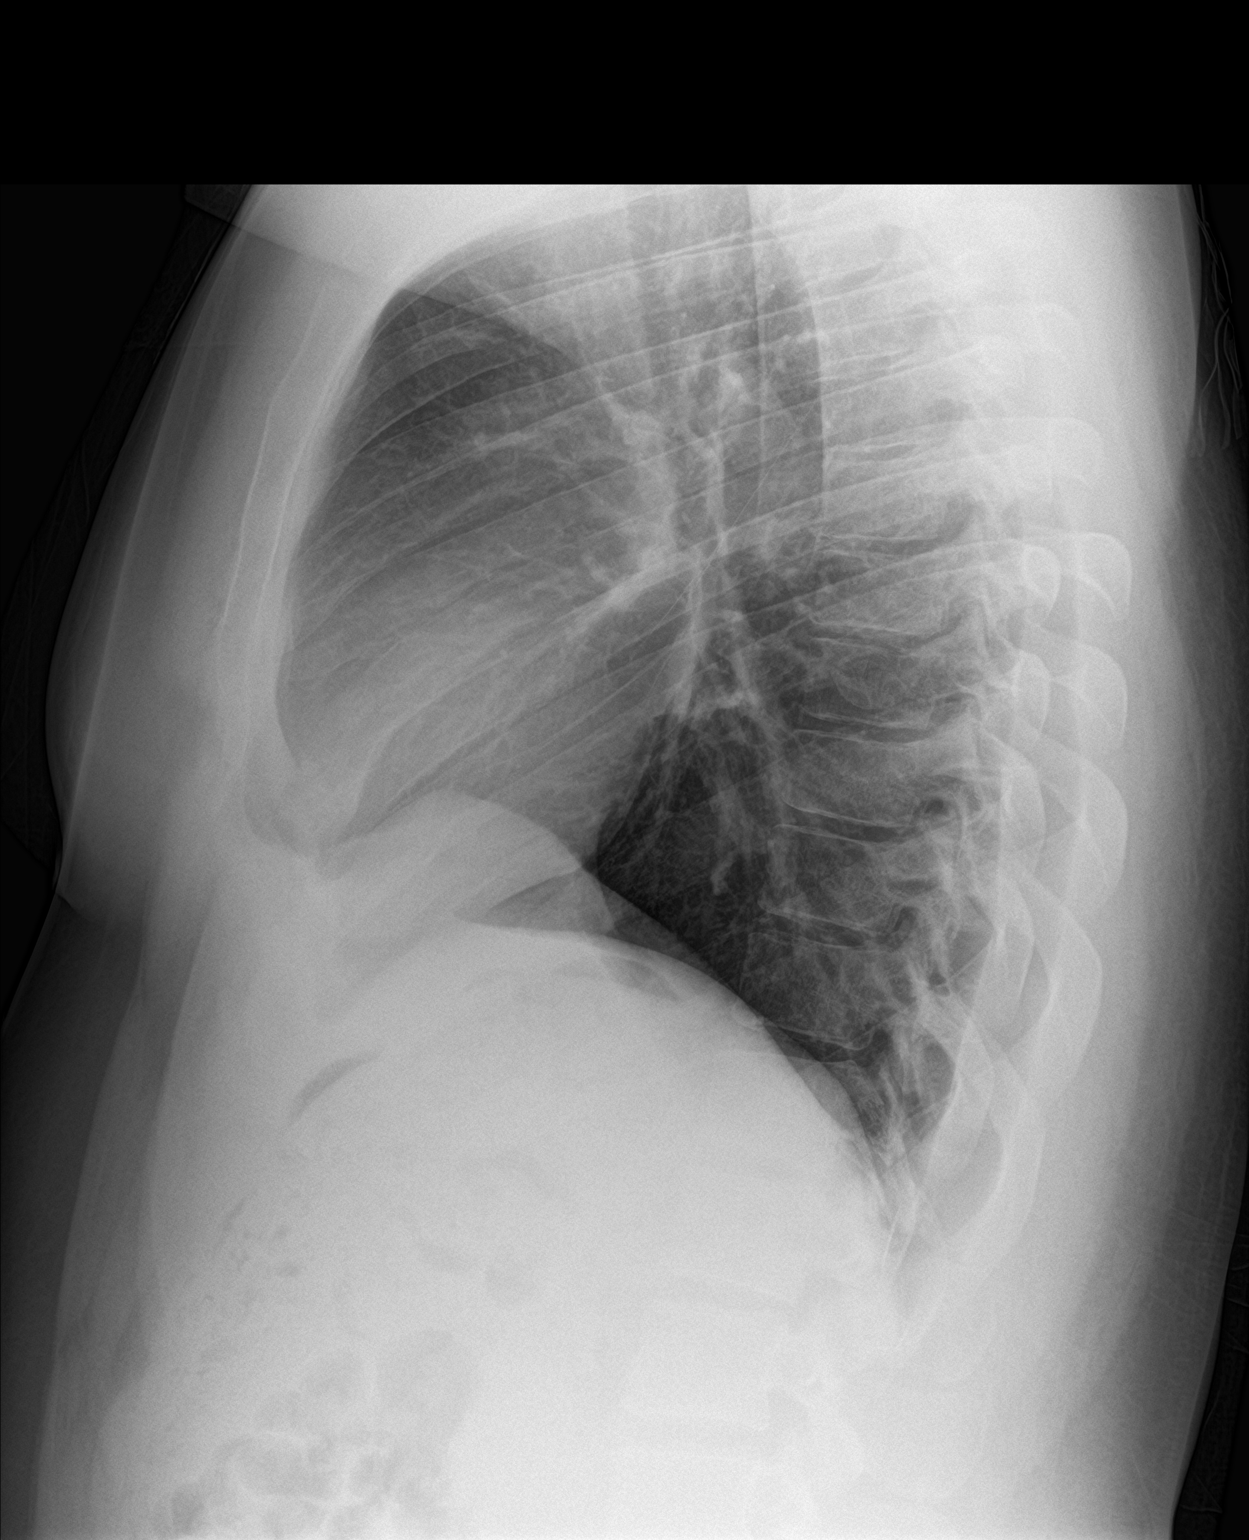

[2 of 2 positions shown; findings below may reference images not displayed]

FINDINGS: The heart size and mediastinal contours are within normal limits.
Both lungs are clear. The visualized skeletal structures are
unremarkable.
IMPRESSION: No active cardiopulmonary disease.

## 2019-06-27 ENCOUNTER — Other Ambulatory Visit: Payer: Self-pay | Admitting: Internal Medicine

## 2019-06-27 DIAGNOSIS — I10 Essential (primary) hypertension: Secondary | ICD-10-CM

## 2019-08-02 ENCOUNTER — Other Ambulatory Visit: Payer: Self-pay

## 2019-08-02 DIAGNOSIS — Z20822 Contact with and (suspected) exposure to covid-19: Secondary | ICD-10-CM

## 2019-08-03 ENCOUNTER — Telehealth: Payer: BC Managed Care – PPO | Admitting: Nurse Practitioner

## 2019-08-03 DIAGNOSIS — U071 COVID-19: Secondary | ICD-10-CM

## 2019-08-03 DIAGNOSIS — Z7189 Other specified counseling: Secondary | ICD-10-CM

## 2019-08-03 LAB — NOVEL CORONAVIRUS, NAA: SARS-CoV-2, NAA: DETECTED — AB

## 2019-08-03 NOTE — Progress Notes (Signed)
Your test for COVID-19 was positive, meaning that you were infected with the novel coronavirus and could give the germ to others. Even though you do not feel all that bad you still have covid and can potentially give it to others. The bad thing about this virus, is you can have no symptoms and still carry the virus and give it to others.  You have been enrolled in [facility] for COVID-19. Daily you will receive a questionnaire within the [facility] website. Our COVID-19 response team will be monitoring your responses daily.  Please continue isolation at home, for at least 7 days since the start of your fever/cough/breathlessness and until you have had 3 consecutive days without fever (without taking a fever reducer) and with cough/breathlessness improving. Please continue good preventive care measures, including:  frequent hand-washing, avoid touching your face, cover coughs/sneezes, stay out of crowds and keep a 6 foot distance from others.  Recheck or go to the nearest hospital ED tent for re-assessment if fever/cough/breathlessness return.  5-10 minutes spent reviewing and documenting in chart.

## 2019-08-30 ENCOUNTER — Other Ambulatory Visit: Payer: Self-pay | Admitting: Internal Medicine

## 2019-08-30 DIAGNOSIS — J4530 Mild persistent asthma, uncomplicated: Secondary | ICD-10-CM

## 2019-08-30 MED ORDER — ALBUTEROL SULFATE HFA 108 (90 BASE) MCG/ACT IN AERS: INHALATION_SPRAY | RESPIRATORY_TRACT | 3 refills | Status: DC

## 2019-09-24 ENCOUNTER — Other Ambulatory Visit: Payer: Self-pay | Admitting: Internal Medicine

## 2019-09-24 DIAGNOSIS — I10 Essential (primary) hypertension: Secondary | ICD-10-CM

## 2019-10-26 ENCOUNTER — Other Ambulatory Visit: Payer: Self-pay | Admitting: Internal Medicine

## 2019-10-26 DIAGNOSIS — J4530 Mild persistent asthma, uncomplicated: Secondary | ICD-10-CM

## 2019-11-01 ENCOUNTER — Other Ambulatory Visit: Payer: Self-pay | Admitting: Internal Medicine

## 2019-11-01 DIAGNOSIS — J45901 Unspecified asthma with (acute) exacerbation: Secondary | ICD-10-CM

## 2019-11-01 DIAGNOSIS — J4530 Mild persistent asthma, uncomplicated: Secondary | ICD-10-CM

## 2019-11-01 MED ORDER — ALBUTEROL SULFATE (2.5 MG/3ML) 0.083% IN NEBU: INHALATION_SOLUTION | RESPIRATORY_TRACT | 5 refills | Status: AC

## 2019-11-24 ENCOUNTER — Encounter: Payer: Self-pay | Admitting: Internal Medicine

## 2019-12-05 ENCOUNTER — Other Ambulatory Visit: Payer: Self-pay | Admitting: Internal Medicine

## 2019-12-05 DIAGNOSIS — J4530 Mild persistent asthma, uncomplicated: Secondary | ICD-10-CM

## 2019-12-26 ENCOUNTER — Other Ambulatory Visit: Payer: Self-pay | Admitting: Internal Medicine

## 2019-12-26 DIAGNOSIS — I10 Essential (primary) hypertension: Secondary | ICD-10-CM

## 2019-12-26 MED ORDER — CHLORTHALIDONE 25 MG PO TABS: 25.0000 mg | ORAL_TABLET | Freq: Every day | ORAL | 0 refills | Status: DC

## 2020-01-12 ENCOUNTER — Encounter: Payer: Self-pay | Admitting: Internal Medicine

## 2020-01-12 ENCOUNTER — Other Ambulatory Visit: Payer: Self-pay | Admitting: Internal Medicine

## 2020-01-12 DIAGNOSIS — I10 Essential (primary) hypertension: Secondary | ICD-10-CM

## 2020-01-19 ENCOUNTER — Other Ambulatory Visit: Payer: Self-pay

## 2020-01-19 ENCOUNTER — Encounter: Payer: Self-pay | Admitting: Internal Medicine

## 2020-01-19 ENCOUNTER — Ambulatory Visit: Payer: BC Managed Care – PPO | Admitting: Internal Medicine

## 2020-01-19 VITALS — BP 138/86 | HR 60 | Temp 98.2°F | Resp 16 | Ht 67.0 in | Wt 235.0 lb

## 2020-01-19 DIAGNOSIS — E559 Vitamin D deficiency, unspecified: Secondary | ICD-10-CM

## 2020-01-19 DIAGNOSIS — J4530 Mild persistent asthma, uncomplicated: Secondary | ICD-10-CM | POA: Diagnosis not present

## 2020-01-19 DIAGNOSIS — I10 Essential (primary) hypertension: Secondary | ICD-10-CM | POA: Diagnosis not present

## 2020-01-19 DIAGNOSIS — Z23 Encounter for immunization: Secondary | ICD-10-CM

## 2020-01-19 DIAGNOSIS — Z0000 Encounter for general adult medical examination without abnormal findings: Secondary | ICD-10-CM | POA: Diagnosis not present

## 2020-01-19 DIAGNOSIS — J45901 Unspecified asthma with (acute) exacerbation: Secondary | ICD-10-CM

## 2020-01-19 LAB — URINALYSIS, ROUTINE W REFLEX MICROSCOPIC
Bilirubin Urine: NEGATIVE
Hgb urine dipstick: NEGATIVE
Ketones, ur: NEGATIVE
Leukocytes,Ua: NEGATIVE
Nitrite: NEGATIVE
RBC / HPF: NONE SEEN (ref 0–?)
Specific Gravity, Urine: 1.02 (ref 1.000–1.030)
Total Protein, Urine: NEGATIVE
Urine Glucose: NEGATIVE
Urobilinogen, UA: 0.2 (ref 0.0–1.0)
WBC, UA: NONE SEEN (ref 0–?)
pH: 6 (ref 5.0–8.0)

## 2020-01-19 LAB — CBC WITH DIFFERENTIAL/PLATELET
Basophils Absolute: 0 10*3/uL (ref 0.0–0.1)
Basophils Relative: 0.7 % (ref 0.0–3.0)
Eosinophils Absolute: 0.1 10*3/uL (ref 0.0–0.7)
Eosinophils Relative: 2.7 % (ref 0.0–5.0)
HCT: 41.3 % (ref 39.0–52.0)
Hemoglobin: 13.6 g/dL (ref 13.0–17.0)
Lymphocytes Relative: 30 % (ref 12.0–46.0)
Lymphs Abs: 1.4 10*3/uL (ref 0.7–4.0)
MCHC: 33 g/dL (ref 30.0–36.0)
MCV: 88.9 fl (ref 78.0–100.0)
Monocytes Absolute: 0.9 10*3/uL (ref 0.1–1.0)
Monocytes Relative: 17.8 % — ABNORMAL HIGH (ref 3.0–12.0)
Neutro Abs: 2.3 10*3/uL (ref 1.4–7.7)
Neutrophils Relative %: 48.8 % (ref 43.0–77.0)
Platelets: 221 10*3/uL (ref 150.0–400.0)
RBC: 4.64 Mil/uL (ref 4.22–5.81)
RDW: 13.3 % (ref 11.5–15.5)
WBC: 4.8 10*3/uL (ref 4.0–10.5)

## 2020-01-19 LAB — HEPATIC FUNCTION PANEL
ALT: 19 U/L (ref 0–53)
AST: 21 U/L (ref 0–37)
Albumin: 4.1 g/dL (ref 3.5–5.2)
Alkaline Phosphatase: 35 U/L — ABNORMAL LOW (ref 39–117)
Bilirubin, Direct: 0.1 mg/dL (ref 0.0–0.3)
Total Bilirubin: 0.6 mg/dL (ref 0.2–1.2)
Total Protein: 6.8 g/dL (ref 6.0–8.3)

## 2020-01-19 LAB — BASIC METABOLIC PANEL
BUN: 19 mg/dL (ref 6–23)
CO2: 29 mEq/L (ref 19–32)
Calcium: 9.3 mg/dL (ref 8.4–10.5)
Chloride: 103 mEq/L (ref 96–112)
Creatinine, Ser: 0.8 mg/dL (ref 0.40–1.50)
GFR: 134.46 mL/min (ref >60.00)
Glucose, Bld: 96 mg/dL (ref 70–99)
Potassium: 3.7 mEq/L (ref 3.5–5.1)
Sodium: 137 mEq/L (ref 135–145)

## 2020-01-19 LAB — VITAMIN D 25 HYDROXY (VIT D DEFICIENCY, FRACTURES): VITD: 24.8 ng/mL — ABNORMAL LOW (ref 30.00–100.00)

## 2020-01-19 LAB — LIPID PANEL
Cholesterol: 154 mg/dL (ref 0–200)
HDL: 37.5 mg/dL — ABNORMAL LOW (ref >39.00)
LDL Cholesterol: 104 mg/dL — ABNORMAL HIGH (ref 0–99)
NonHDL: 116.96
Total CHOL/HDL Ratio: 4
Triglycerides: 65 mg/dL (ref 0.0–149.0)
VLDL: 13 mg/dL (ref 0.0–40.0)

## 2020-01-19 LAB — TSH: TSH: 0.59 u[IU]/mL (ref 0.35–4.50)

## 2020-01-19 MED ORDER — CHOLECALCIFEROL 50 MCG (2000 UT) PO TABS: 2.0000 | ORAL_TABLET | Freq: Every day | ORAL | 1 refills | Status: DC

## 2020-01-19 MED ORDER — CARVEDILOL 3.125 MG PO TABS: 3.1250 mg | ORAL_TABLET | Freq: Two times a day (BID) | ORAL | 1 refills | Status: DC

## 2020-01-19 MED ORDER — ALBUTEROL SULFATE (5 MG/ML) 0.5% IN NEBU: 2.5000 mg | INHALATION_SOLUTION | Freq: Four times a day (QID) | RESPIRATORY_TRACT | 12 refills | Status: DC | PRN

## 2020-01-19 NOTE — Progress Notes (Signed)
Subjective:  Patient ID: Luke Gross, male    DOB: November 26, 1985  Age: 34 y.o. MRN: [id number]  CC: Annual Exam, Hypertension, and Asthma  This visit occurred during the SARS-CoV-2 public health emergency.  Safety protocols were in place, including screening questions prior to the visit, additional usage of staff PPE, and extensive cleaning of exam room while observing appropriate contact time as indicated for disinfecting solutions.    HPI [name] presents for a CPX.  He tells me his blood pressure has been well controlled.  He plays basketball and does not experience chest pain, shortness of breath, dizziness, lightheadedness, or near syncope.  He tells me his asthma is well controlled with the current inhalers.  Outpatient Medications Prior to Visit  Medication Sig Dispense Refill  . albuterol (PROVENTIL) (2.5 MG/3ML) 0.083% nebulizer solution USE 1 VIAL VIA NEBULIZER EVERY 4-6 HOURS AS NEEDED FOR COUGH OR WHEEZE 75 mL 5  . albuterol (VENTOLIN HFA) 108 (90 Base) MCG/ACT inhaler INHALE 1 TO 2 PUFFS INTO THE LUNGS EVERY 6 HOURS AS NEEDED FOR WHEEZING OR SHORTNESS OF BREATH 8.5 g 3  . chlorthalidone (HYGROTON) 25 MG tablet Take 1 tablet (25 mg total) by mouth daily. 90 tablet 0  . montelukast (SINGULAIR) 10 MG tablet TAKE 1 TABLET BY MOUTH EVERYDAY AT BEDTIME 90 tablet 1  . potassium chloride SA (K-DUR,KLOR-CON) 20 MEQ tablet Take 1 tablet (20 mEq total) by mouth 3 (three) times daily. 180 tablet 1  . WIXELA INHUB 500-50 MCG/DOSE AEPB TAKE 1 PUFF BY MOUTH TWICE A DAY 180 each 1  . albuterol (PROVENTIL) (5 MG/ML) 0.5% nebulizer solution Take 0.5 mLs (2.5 mg total) by nebulization every 6 (six) hours as needed for wheezing or shortness of breath. 20 mL 12  . carvedilol (COREG) 3.125 MG tablet Take 1 tablet (3.125 mg total) by mouth 2 (two) times daily with a meal. 180 tablet 1  . Cholecalciferol 2000 units TABS Take 2 tablets (4,000 Units total) by mouth daily. 180 tablet 1    No facility-administered medications prior to visit.    ROS Review of Systems  Constitutional: Negative.  Negative for chills, diaphoresis, fatigue and fever.  HENT: Negative.   Eyes: Negative for visual disturbance.  Respiratory: Negative for cough, chest tightness, shortness of breath and wheezing.   Cardiovascular: Negative for chest pain, palpitations and leg swelling.  Gastrointestinal: Negative for abdominal pain, constipation, diarrhea, nausea and vomiting.  Endocrine: Negative.   Genitourinary: Negative.  Negative for difficulty urinating, dysuria, scrotal swelling and testicular pain.  Musculoskeletal: Negative for arthralgias and myalgias.  Skin: Negative for color change, pallor and rash.  Neurological: Negative for dizziness, weakness, light-headedness and headaches.  Hematological: Negative for adenopathy. Does not bruise/bleed easily.  Psychiatric/Behavioral: Negative.     Objective:  BP 138/86 (BP Location: Left Arm, Patient Position: Sitting, Cuff Size: Large)   Pulse 60   Temp 98.2 F (36.8 C) (Oral)   Resp 16   Ht 5\' 7"  (1.702 m)   Wt 235 lb (106.6 kg)   SpO2 97%   BMI 36.81 kg/m   BP Readings from Last 3 Encounters:  01/19/20 138/86  04/13/19 (!) 148/62  04/01/19 (!) 148/76    Wt Readings from Last 3 Encounters:  01/19/20 235 lb (106.6 kg)  04/13/19 218 lb (98.9 kg)  04/01/19 220 lb (99.8 kg)    Physical Exam Vitals reviewed.  Constitutional:      Appearance: Normal appearance.  HENT:     Nose:  Nose normal.     Mouth/Throat:     Mouth: Mucous membranes are moist.  Eyes:     General: No scleral icterus.    Conjunctiva/sclera: Conjunctivae normal.  Cardiovascular:     Rate and Rhythm: Regular rhythm.     Heart sounds: No murmur.  Pulmonary:     Effort: Pulmonary effort is normal.     Breath sounds: No stridor. No wheezing, rhonchi or rales.  Abdominal:     General: Abdomen is flat. Bowel sounds are normal. There is no distension.      Palpations: Abdomen is soft. There is no hepatomegaly or mass.     Tenderness: There is no abdominal tenderness. There is no guarding.  Musculoskeletal:        General: Normal range of motion.     Cervical back: Neck supple.     Right lower leg: No edema.     Left lower leg: No edema.  Lymphadenopathy:     Cervical: No cervical adenopathy.  Skin:    General: Skin is warm and dry.     Coloration: Skin is not pale.  Neurological:     General: No focal deficit present.     Mental Status: He is alert and oriented to person, place, and time. Mental status is at baseline.     Lab Results  Component Value Date   WBC 4.8 01/19/2020   HGB 13.6 01/19/2020   HCT 41.3 01/19/2020   PLT 221.0 01/19/2020   GLUCOSE 96 01/19/2020   CHOL 154 01/19/2020   TRIG 65.0 01/19/2020   HDL 37.50 (L) 01/19/2020   LDLCALC 104 (H) 01/19/2020   ALT 19 01/19/2020   AST 21 01/19/2020   NA 137 01/19/2020   K 3.7 01/19/2020   CL 103 01/19/2020   CREATININE 0.80 01/19/2020   BUN 19 01/19/2020   CO2 29 01/19/2020   TSH 0.59 01/19/2020    DG Chest 2 View  Result Date: 04/01/2019 CLINICAL DATA:  Cough EXAM: CHEST - 2 VIEW COMPARISON:  08/03/2009 FINDINGS: The heart size and mediastinal contours are within normal limits. Both lungs are clear. The visualized skeletal structures are unremarkable. IMPRESSION: No active cardiopulmonary disease. Electronically Signed   By: Luke Gross M.D.   On: 04/01/2019 20:55    Assessment & Plan:   Luke Gross was seen today for annual exam, hypertension and asthma.  Diagnoses and all orders for this visit:  Essential hypertension, benign- His blood pressure is adequately well controlled.  Electrolytes and renal function are normal.  Will continue the combination of chlorthalidone and carvedilol. -     CBC with Differential/Platelet -     Basic metabolic panel -     Hepatic function panel -     TSH -     Urinalysis, Routine w reflex microscopic -     VITAMIN D  25 Hydroxy (Vit-D Deficiency, Fractures) -     carvedilol (COREG) 3.125 MG tablet; Take 1 tablet (3.125 mg total) by mouth 2 (two) times daily with a meal.  Routine general medical examination at a health care facility- Exam completed, labs reviewed, he refused a flu vaccine, patient education was given. -     Lipid panel  Vitamin D deficiency -     VITAMIN D 25 Hydroxy (Vit-D Deficiency, Fractures) -     Cholecalciferol 50 MCG (2000 UT) TABS; Take 2 tablets (4,000 Units total) by mouth daily.  Mild persistent asthma- His symptoms are well controlled.  Will continue the combination  of a LABA and ICS. -     albuterol (PROVENTIL) (5 MG/ML) 0.5% nebulizer solution; Take 0.5 mLs (2.5 mg total) by nebulization every 6 (six) hours as needed for wheezing or shortness of breath.  Asthma with acute exacerbation -     albuterol (PROVENTIL) (5 MG/ML) 0.5% nebulizer solution; Take 0.5 mLs (2.5 mg total) by nebulization every 6 (six) hours as needed for wheezing or shortness of breath.  Need for pneumococcal vaccination -     Pneumococcal polysaccharide vaccine 23-valent greater than or equal to 2yo subcutaneous/IM   I have changed Edrik L. Valin "Chris"'s Cholecalciferol. I am also having him maintain his potassium chloride SA, montelukast, Wixela Inhub, albuterol, albuterol, chlorthalidone, carvedilol, and albuterol.  Meds ordered this encounter  Medications  . carvedilol (COREG) 3.125 MG tablet    Sig: Take 1 tablet (3.125 mg total) by mouth 2 (two) times daily with a meal.    Dispense:  180 tablet    Refill:  1  . albuterol (PROVENTIL) (5 MG/ML) 0.5% nebulizer solution    Sig: Take 0.5 mLs (2.5 mg total) by nebulization every 6 (six) hours as needed for wheezing or shortness of breath.    Dispense:  20 mL    Refill:  12  . Cholecalciferol 50 MCG (2000 UT) TABS    Sig: Take 2 tablets (4,000 Units total) by mouth daily.    Dispense:  180 tablet    Refill:  1     Follow-up: Return  in about 6 months (around 07/21/2020).  Scarlette Calico, MD

## 2020-01-19 NOTE — Patient Instructions (Signed)

## 2020-01-21 ENCOUNTER — Telehealth: Payer: Self-pay

## 2020-01-21 ENCOUNTER — Encounter: Payer: Self-pay | Admitting: Internal Medicine

## 2020-01-21 NOTE — Telephone Encounter (Signed)
Key: [id number]

## 2020-02-23 ENCOUNTER — Other Ambulatory Visit: Payer: Self-pay | Admitting: Internal Medicine

## 2020-02-23 DIAGNOSIS — J4530 Mild persistent asthma, uncomplicated: Secondary | ICD-10-CM

## 2020-03-07 ENCOUNTER — Other Ambulatory Visit: Payer: Self-pay | Admitting: Internal Medicine

## 2020-03-07 DIAGNOSIS — J4530 Mild persistent asthma, uncomplicated: Secondary | ICD-10-CM

## 2020-03-07 DIAGNOSIS — I10 Essential (primary) hypertension: Secondary | ICD-10-CM

## 2020-08-18 ENCOUNTER — Other Ambulatory Visit: Payer: Self-pay | Admitting: Internal Medicine

## 2020-08-18 DIAGNOSIS — J4530 Mild persistent asthma, uncomplicated: Secondary | ICD-10-CM

## 2020-08-22 ENCOUNTER — Other Ambulatory Visit: Payer: Self-pay | Admitting: Internal Medicine

## 2020-08-22 DIAGNOSIS — J4530 Mild persistent asthma, uncomplicated: Secondary | ICD-10-CM

## 2020-09-22 ENCOUNTER — Other Ambulatory Visit: Payer: Self-pay | Admitting: Internal Medicine

## 2020-09-22 DIAGNOSIS — I10 Essential (primary) hypertension: Secondary | ICD-10-CM

## 2020-10-04 ENCOUNTER — Other Ambulatory Visit: Payer: Self-pay | Admitting: Internal Medicine

## 2020-10-04 DIAGNOSIS — I10 Essential (primary) hypertension: Secondary | ICD-10-CM

## 2020-10-22 ENCOUNTER — Other Ambulatory Visit: Payer: Self-pay | Admitting: Internal Medicine

## 2020-10-22 DIAGNOSIS — J4530 Mild persistent asthma, uncomplicated: Secondary | ICD-10-CM

## 2020-11-19 ENCOUNTER — Other Ambulatory Visit: Payer: Self-pay | Admitting: Internal Medicine

## 2020-11-19 DIAGNOSIS — J4530 Mild persistent asthma, uncomplicated: Secondary | ICD-10-CM

## 2020-12-28 ENCOUNTER — Encounter: Payer: Self-pay | Admitting: Internal Medicine

## 2020-12-28 ENCOUNTER — Other Ambulatory Visit: Payer: Self-pay | Admitting: Internal Medicine

## 2020-12-28 DIAGNOSIS — I10 Essential (primary) hypertension: Secondary | ICD-10-CM

## 2020-12-28 MED ORDER — CARVEDILOL 3.125 MG PO TABS: 3.1250 mg | ORAL_TABLET | Freq: Two times a day (BID) | ORAL | 1 refills | Status: DC

## 2021-01-01 ENCOUNTER — Other Ambulatory Visit: Payer: Self-pay | Admitting: Internal Medicine

## 2021-01-01 ENCOUNTER — Telehealth: Payer: Self-pay | Admitting: Internal Medicine

## 2021-01-01 DIAGNOSIS — I10 Essential (primary) hypertension: Secondary | ICD-10-CM

## 2021-01-01 MED ORDER — CHLORTHALIDONE 25 MG PO TABS: 25.0000 mg | ORAL_TABLET | Freq: Every day | ORAL | 0 refills | Status: DC

## 2021-01-01 MED ORDER — CARVEDILOL 3.125 MG PO TABS: 3.1250 mg | ORAL_TABLET | Freq: Two times a day (BID) | ORAL | 0 refills | Status: DC

## 2021-01-01 NOTE — Telephone Encounter (Signed)
Appointment scheduled 3/16 Can short supply be refilled?

## 2021-01-03 ENCOUNTER — Other Ambulatory Visit: Payer: Self-pay | Admitting: Internal Medicine

## 2021-01-03 DIAGNOSIS — I10 Essential (primary) hypertension: Secondary | ICD-10-CM

## 2021-01-17 ENCOUNTER — Encounter: Payer: Self-pay | Admitting: Internal Medicine

## 2021-01-17 ENCOUNTER — Ambulatory Visit: Payer: BC Managed Care – PPO | Admitting: Internal Medicine

## 2021-01-17 ENCOUNTER — Other Ambulatory Visit: Payer: Self-pay

## 2021-01-17 VITALS — BP 138/76 | HR 67 | Temp 98.4°F | Resp 16 | Ht 67.0 in | Wt 240.0 lb

## 2021-01-17 DIAGNOSIS — Z0000 Encounter for general adult medical examination without abnormal findings: Secondary | ICD-10-CM | POA: Diagnosis not present

## 2021-01-17 DIAGNOSIS — T502X5A Adverse effect of carbonic-anhydrase inhibitors, benzothiadiazides and other diuretics, initial encounter: Secondary | ICD-10-CM

## 2021-01-17 DIAGNOSIS — E559 Vitamin D deficiency, unspecified: Secondary | ICD-10-CM

## 2021-01-17 DIAGNOSIS — J4530 Mild persistent asthma, uncomplicated: Secondary | ICD-10-CM

## 2021-01-17 DIAGNOSIS — I10 Essential (primary) hypertension: Secondary | ICD-10-CM

## 2021-01-17 DIAGNOSIS — E876 Hypokalemia: Secondary | ICD-10-CM

## 2021-01-17 NOTE — Progress Notes (Signed)
Subjective:  Patient ID: Luke Gross, male    DOB: May 11, 1986  Age: 35 y.o. MRN: [id number]  CC: Hypertension and Asthma  This visit occurred during the SARS-CoV-2 public health emergency.  Safety protocols were in place, including screening questions prior to the visit, additional usage of staff PPE, and extensive cleaning of exam room while observing appropriate contact time as indicated for disinfecting solutions.    HPI Luke Gross presents for f/up -   He tells me that his asthma symptoms have been well controlled recently.  He also tells me his blood pressure is well controlled.  He denies headache, blurred vision, chest pain, shortness of breath, dyspnea on exertion, edema, or fatigue.  Outpatient Medications Prior to Visit  Medication Sig Dispense Refill  . albuterol (PROVENTIL) (2.5 MG/3ML) 0.083% nebulizer solution USE 1 VIAL VIA NEBULIZER EVERY 4-6 HOURS AS NEEDED FOR COUGH OR WHEEZE 75 mL 5  . albuterol (VENTOLIN HFA) 108 (90 Base) MCG/ACT inhaler INHALE 1 TO 2 PUFFS INTO THE LUNGS EVERY 6 HOURS AS NEEDED FOR WHEEZING OR SHORTNES OF BREATH 8.5 each 2  . carvedilol (COREG) 3.125 MG tablet Take 1 tablet (3.125 mg total) by mouth 2 (two) times daily with a meal. Annual appt is due must see provider for future refills 60 tablet 0  . chlorthalidone (HYGROTON) 25 MG tablet Take 1 tablet (25 mg total) by mouth daily. 30 tablet 0  . montelukast (SINGULAIR) 10 MG tablet TAKE 1 TABLET BY MOUTH EVERYDAY AT BEDTIME 90 tablet 1  . WIXELA INHUB 500-50 MCG/DOSE AEPB INHALE 1 PUFF BY MOUTH TWICE A DAY 180 each 1  . albuterol (PROVENTIL) (5 MG/ML) 0.5% nebulizer solution Take 0.5 mLs (2.5 mg total) by nebulization every 6 (six) hours as needed for wheezing or shortness of breath. 20 mL 12  . Cholecalciferol 50 MCG (2000 UT) TABS Take 2 tablets (4,000 Units total) by mouth daily. 180 tablet 1  . potassium chloride SA (K-DUR,KLOR-CON) 20 MEQ tablet Take 1 tablet (20 mEq total) by  mouth 3 (three) times daily. 180 tablet 1   No facility-administered medications prior to visit.    ROS Review of Systems  Constitutional: Positive for unexpected weight change (wt gain). Negative for chills, diaphoresis and fatigue.  HENT: Negative.   Eyes: Negative.   Respiratory: Negative for cough, chest tightness, shortness of breath and wheezing.   Cardiovascular: Negative for chest pain, palpitations and leg swelling.  Gastrointestinal: Negative for abdominal pain, constipation, diarrhea, nausea and vomiting.  Genitourinary: Negative.  Negative for difficulty urinating.  Musculoskeletal: Negative for arthralgias, back pain, myalgias and neck pain.  Skin: Negative.  Negative for color change and pallor.  Neurological: Negative.  Negative for dizziness, weakness and headaches.  Hematological: Negative for adenopathy. Does not bruise/bleed easily.  Psychiatric/Behavioral: Negative.     Objective:  BP 138/76 (BP Location: Left Arm, Patient Position: Sitting, Cuff Size: Large)   Pulse 67   Temp 98.4 F (36.9 C) (Oral)   Resp 16   Ht 5\' 7"  (1.702 m)   Wt 240 lb (108.9 kg)   SpO2 97%   BMI 37.59 kg/m   BP Readings from Last 3 Encounters:  01/17/21 138/76  01/19/20 138/86  04/13/19 (!) 148/62    Wt Readings from Last 3 Encounters:  01/17/21 240 lb (108.9 kg)  01/19/20 235 lb (106.6 kg)  04/13/19 218 lb (98.9 kg)    Physical Exam Vitals reviewed.  HENT:     Nose: Nose normal.  Mouth/Throat:     Mouth: Mucous membranes are moist.  Eyes:     General: No scleral icterus.    Conjunctiva/sclera: Conjunctivae normal.  Cardiovascular:     Rate and Rhythm: Normal rate and regular rhythm.     Pulses: Normal pulses.     Heart sounds: No murmur heard.   Pulmonary:     Effort: Pulmonary effort is normal.     Breath sounds: No stridor. No wheezing, rhonchi or rales.  Abdominal:     General: Abdomen is flat. Bowel sounds are normal. There is no distension.      Palpations: Abdomen is soft. There is no hepatomegaly, splenomegaly or mass.     Tenderness: There is no abdominal tenderness.  Musculoskeletal:        General: Normal range of motion.     Cervical back: Neck supple.     Right lower leg: No edema.     Left lower leg: No edema.  Lymphadenopathy:     Cervical: No cervical adenopathy.  Skin:    General: Skin is warm and dry.  Neurological:     General: No focal deficit present.     Mental Status: He is alert.  Psychiatric:        Mood and Affect: Mood normal.        Behavior: Behavior normal.     Lab Results  Component Value Date   WBC 5.4 01/17/2021   HGB 13.5 01/17/2021   HCT 40.2 01/17/2021   PLT 255.0 01/17/2021   GLUCOSE 79 01/17/2021   CHOL 154 01/19/2020   TRIG 65.0 01/19/2020   HDL 37.50 (L) 01/19/2020   LDLCALC 104 (H) 01/19/2020   ALT 19 01/19/2020   AST 21 01/19/2020   NA 141 01/17/2021   K 3.4 (L) 01/17/2021   CL 103 01/17/2021   CREATININE 0.86 01/17/2021   BUN 20 01/17/2021   CO2 28 01/17/2021   TSH 0.59 01/19/2020    DG Chest 2 View  Result Date: 04/01/2019 CLINICAL DATA:  Cough EXAM: CHEST - 2 VIEW COMPARISON:  08/03/2009 FINDINGS: The heart size and mediastinal contours are within normal limits. Both lungs are clear. The visualized skeletal structures are unremarkable. IMPRESSION: No active cardiopulmonary disease. Electronically Signed   By: Katherine Mantle M.D.   On: 04/01/2019 20:55    Assessment & Plan:   Luke Gross was seen today for hypertension and asthma.  Diagnoses and all orders for this visit:  Essential hypertension, benign- His blood pressure is well controlled.  I will treat the hypokalemia. -     CBC with Differential/Platelet; Future -     Basic metabolic panel; Future -     Basic metabolic panel -     CBC with Differential/Platelet -     potassium chloride SA (KLOR-CON) 20 MEQ tablet; Take 1 tablet (20 mEq total) by mouth 2 (two) times daily.  Vitamin D deficiency -      VITAMIN D 25 Hydroxy (Vit-D Deficiency, Fractures); Future -     VITAMIN D 25 Hydroxy (Vit-D Deficiency, Fractures) -     Cholecalciferol 50 MCG (2000 UT) TABS; Take 2 tablets (4,000 Units total) by mouth daily.  Routine general medical examination at a health care facility- Exam completed, labs reviewed, vaccines reviewed - He refused a flu vaccine, no cancer screenings are indicated.  Diuretic-induced hypokalemia -     potassium chloride SA (KLOR-CON) 20 MEQ tablet; Take 1 tablet (20 mEq total) by mouth 2 (two) times daily.  Mild  persistent asthma without complication- His symptoms are well controlled.  Will continue the current inhalers.   I have discontinued Zaedyn L. Vanhorn "Chris"'s potassium chloride SA. I am also having him start on potassium chloride SA. Additionally, I am having him maintain his albuterol, Wixela Inhub, montelukast, albuterol, chlorthalidone, carvedilol, and Cholecalciferol.  Meds ordered this encounter  Medications  . Cholecalciferol 50 MCG (2000 UT) TABS    Sig: Take 2 tablets (4,000 Units total) by mouth daily.    Dispense:  180 tablet    Refill:  1  . potassium chloride SA (KLOR-CON) 20 MEQ tablet    Sig: Take 1 tablet (20 mEq total) by mouth 2 (two) times daily.    Dispense:  180 tablet    Refill:  1     Follow-up: Return in about 6 months (around 07/20/2021).  Sanda Linger, MD

## 2021-01-17 NOTE — Patient Instructions (Signed)

## 2021-01-18 DIAGNOSIS — E876 Hypokalemia: Secondary | ICD-10-CM | POA: Insufficient documentation

## 2021-01-18 LAB — CBC WITH DIFFERENTIAL/PLATELET
Basophils Absolute: 0.1 10*3/uL (ref 0.0–0.1)
Basophils Relative: 1.3 % (ref 0.0–3.0)
Eosinophils Absolute: 0.2 10*3/uL (ref 0.0–0.7)
Eosinophils Relative: 3.5 % (ref 0.0–5.0)
HCT: 40.2 % (ref 39.0–52.0)
Hemoglobin: 13.5 g/dL (ref 13.0–17.0)
Lymphocytes Relative: 38.7 % (ref 12.0–46.0)
Lymphs Abs: 2.1 10*3/uL (ref 0.7–4.0)
MCHC: 33.5 g/dL (ref 30.0–36.0)
MCV: 88.3 fl (ref 78.0–100.0)
Monocytes Absolute: 0.9 10*3/uL (ref 0.1–1.0)
Monocytes Relative: 16 % — ABNORMAL HIGH (ref 3.0–12.0)
Neutro Abs: 2.2 10*3/uL (ref 1.4–7.7)
Neutrophils Relative %: 40.5 % — ABNORMAL LOW (ref 43.0–77.0)
Platelets: 255 10*3/uL (ref 150.0–400.0)
RBC: 4.56 Mil/uL (ref 4.22–5.81)
RDW: 13.3 % (ref 11.5–15.5)
WBC: 5.4 10*3/uL (ref 4.0–10.5)

## 2021-01-18 LAB — BASIC METABOLIC PANEL
BUN: 20 mg/dL (ref 6–23)
CO2: 28 mEq/L (ref 19–32)
Calcium: 9.8 mg/dL (ref 8.4–10.5)
Chloride: 103 mEq/L (ref 96–112)
Creatinine, Ser: 0.86 mg/dL (ref 0.40–1.50)
GFR: 113.13 mL/min (ref >60.00)
Glucose, Bld: 79 mg/dL (ref 70–99)
Potassium: 3.4 mEq/L — ABNORMAL LOW (ref 3.5–5.1)
Sodium: 141 mEq/L (ref 135–145)

## 2021-01-18 LAB — VITAMIN D 25 HYDROXY (VIT D DEFICIENCY, FRACTURES): VITD: 29.86 ng/mL — ABNORMAL LOW (ref 30.00–100.00)

## 2021-01-18 MED ORDER — POTASSIUM CHLORIDE CRYS ER 20 MEQ PO TBCR: 20.0000 meq | EXTENDED_RELEASE_TABLET | Freq: Two times a day (BID) | ORAL | 1 refills | Status: DC

## 2021-01-18 MED ORDER — CHOLECALCIFEROL 50 MCG (2000 UT) PO TABS: 2.0000 | ORAL_TABLET | Freq: Every day | ORAL | 1 refills | Status: AC

## 2021-02-17 ENCOUNTER — Other Ambulatory Visit: Payer: Self-pay | Admitting: Internal Medicine

## 2021-02-17 DIAGNOSIS — J4530 Mild persistent asthma, uncomplicated: Secondary | ICD-10-CM

## 2021-02-21 ENCOUNTER — Other Ambulatory Visit: Payer: Self-pay

## 2021-02-21 ENCOUNTER — Ambulatory Visit: Payer: BC Managed Care – PPO | Admitting: Internal Medicine

## 2021-02-21 ENCOUNTER — Encounter: Payer: Self-pay | Admitting: Internal Medicine

## 2021-02-21 ENCOUNTER — Other Ambulatory Visit: Payer: Self-pay | Admitting: Internal Medicine

## 2021-02-21 VITALS — BP 126/76 | HR 64 | Temp 98.7°F | Ht 67.0 in | Wt 236.0 lb

## 2021-02-21 DIAGNOSIS — L03116 Cellulitis of left lower limb: Secondary | ICD-10-CM | POA: Diagnosis not present

## 2021-02-21 DIAGNOSIS — L280 Lichen simplex chronicus: Secondary | ICD-10-CM | POA: Diagnosis not present

## 2021-02-21 DIAGNOSIS — M79672 Pain in left foot: Secondary | ICD-10-CM

## 2021-02-21 MED ORDER — CLOBETASOL PROPIONATE 0.05 % EX OINT: 1.0000 "application " | TOPICAL_OINTMENT | Freq: Two times a day (BID) | CUTANEOUS | 1 refills | Status: DC

## 2021-02-21 MED ORDER — AMOXICILLIN-POT CLAVULANATE 875-125 MG PO TABS
1.0000 | ORAL_TABLET | Freq: Two times a day (BID) | ORAL | 0 refills | Status: AC
Start: 2021-02-21 — End: 2021-03-03

## 2021-02-21 NOTE — Patient Instructions (Signed)

## 2021-02-21 NOTE — Progress Notes (Signed)
Subjective:  Patient ID: Luke Gross, male    DOB: [birth year]  Age: 35 y.o. MRN: [id number]  CC: Rash  This visit occurred during the SARS-CoV-2 public health emergency.  Safety protocols were in place, including screening questions prior to the visit, additional usage of staff PPE, and extensive cleaning of exam room while observing appropriate contact time as indicated for disinfecting solutions.    HPI Luke Gross presents for f/up -  He has a history of lichen simplex chronicus and tells me that he he has chronic rash on the dorsum of both feet.  He has been using an over-the-counter topical agent but not a steroid.  He complains that for the last 5 days he has had a flareup on his feet, especially the left side.  He said the rash has become painful and swollen.  There is no interest discharge or drainage and he denies fever or chills.  Outpatient Medications Prior to Visit  Medication Sig Dispense Refill  . albuterol (PROVENTIL) (2.5 MG/3ML) 0.083% nebulizer solution USE 1 VIAL VIA NEBULIZER EVERY 4-6 HOURS AS NEEDED FOR COUGH OR WHEEZE 75 mL 5  . albuterol (VENTOLIN HFA) 108 (90 Base) MCG/ACT inhaler INHALE 1 TO 2 PUFFS INTO THE LUNGS EVERY 6 HOURS AS NEEDED FOR WHEEZING OR SHORTNES OF BREATH 8.5 each 2  . carvedilol (COREG) 3.125 MG tablet Take 1 tablet (3.125 mg total) by mouth 2 (two) times daily with a meal. Annual appt is due must see provider for future refills 60 tablet 0  . chlorthalidone (HYGROTON) 25 MG tablet Take 1 tablet (25 mg total) by mouth daily. 30 tablet 0  . Cholecalciferol 50 MCG (2000 UT) TABS Take 2 tablets (4,000 Units total) by mouth daily. 180 tablet 1  . montelukast (SINGULAIR) 10 MG tablet TAKE 1 TABLET BY MOUTH EVERYDAY AT BEDTIME 90 tablet 1  . potassium chloride SA (KLOR-CON) 20 MEQ tablet Take 1 tablet (20 mEq total) by mouth 2 (two) times daily. 180 tablet 1  . WIXELA INHUB 500-50 MCG/DOSE AEPB INHALE 1 PUFF BY MOUTH TWICE A DAY 180  each 1   No facility-administered medications prior to visit.    ROS Review of Systems  Constitutional: Negative for chills and fatigue.  Respiratory: Negative for cough and shortness of breath.   Cardiovascular: Negative.   Genitourinary: Negative.  Negative for difficulty urinating.  Neurological: Negative.   Hematological: Negative for adenopathy. Does not bruise/bleed easily.  Psychiatric/Behavioral: Negative.     Objective:  BP 126/76   Pulse 64   Temp 98.7 F (37.1 C) (Oral)   Ht 5\' 7"  (1.702 m)   Wt 236 lb (107 kg)   SpO2 98%   BMI 36.96 kg/m   BP Readings from Last 3 Encounters:  02/21/21 126/76  01/17/21 138/76  01/19/20 138/86    Wt Readings from Last 3 Encounters:  02/21/21 236 lb (107 kg)  01/17/21 240 lb (108.9 kg)  01/19/20 235 lb (106.6 kg)    Physical Exam Vitals reviewed.  Constitutional:      Appearance: He is not ill-appearing.  Skin:    Findings: Lesion and rash present. No erythema.     Comments: On the dorsum of both feet over the midfoot there are areas of intense hyperpigmentation with some scarring.  There are no open wounds.  The area on the left side is slightly swollen, erythematous, and tender.  There is no induration, fluctuance, or exudates.  Neurological:     General: No  focal deficit present.     Lab Results  Component Value Date   WBC 5.4 01/17/2021   HGB 13.5 01/17/2021   HCT 40.2 01/17/2021   PLT 255.0 01/17/2021   GLUCOSE 79 01/17/2021   CHOL 154 01/19/2020   TRIG 65.0 01/19/2020   HDL 37.50 (L) 01/19/2020   LDLCALC 104 (H) 01/19/2020   ALT 19 01/19/2020   AST 21 01/19/2020   NA 141 01/17/2021   K 3.4 (L) 01/17/2021   CL 103 01/17/2021   CREATININE 0.86 01/17/2021   BUN 20 01/17/2021   CO2 28 01/17/2021   TSH 0.59 01/19/2020    DG Chest 2 View  Result Date: 04/01/2019 CLINICAL DATA:  Cough EXAM: CHEST - 2 VIEW COMPARISON:  08/03/2009 FINDINGS: The heart size and mediastinal contours are within normal  limits. Both lungs are clear. The visualized skeletal structures are unremarkable. IMPRESSION: No active cardiopulmonary disease. Electronically Signed   By: Katherine Mantle M.D.   On: 04/01/2019 20:55    Assessment & Plan:   Emmitt was seen today for rash.  Diagnoses and all orders for this visit:  Left foot pain -     Cancel: DG Foot Complete Left; Future  Cellulitis of left foot excluding toes- I am concerned he may have a secondary, superimposed infection like strep.  I recommended that he start taking Augmentin. -     amoxicillin-clavulanate (AUGMENTIN) 875-125 MG tablet; Take 1 tablet by mouth 2 (two) times daily for 10 days.  Lichen simplex chronicus- Will treat this with a potent topical steroid. -     clobetasol ointment (TEMOVATE) 0.05 %; Apply 1 application topically 2 (two) times daily.   I am having Damascus L. Daphine Deutscher "Thayer Ohm" start on amoxicillin-clavulanate and clobetasol ointment. I am also having him maintain his albuterol, Wixela Inhub, montelukast, chlorthalidone, carvedilol, Cholecalciferol, potassium chloride SA, and albuterol.  Meds ordered this encounter  Medications  . amoxicillin-clavulanate (AUGMENTIN) 875-125 MG tablet    Sig: Take 1 tablet by mouth 2 (two) times daily for 10 days.    Dispense:  20 tablet    Refill:  0  . clobetasol ointment (TEMOVATE) 0.05 %    Sig: Apply 1 application topically 2 (two) times daily.    Dispense:  45 g    Refill:  1     Follow-up: Return in about 3 weeks (around 03/14/2021).  Sanda Linger, MD

## 2021-02-23 ENCOUNTER — Encounter: Payer: Self-pay | Admitting: Internal Medicine

## 2021-03-20 ENCOUNTER — Encounter: Payer: Self-pay | Admitting: Internal Medicine

## 2021-03-21 ENCOUNTER — Other Ambulatory Visit: Payer: Self-pay | Admitting: Internal Medicine

## 2021-03-21 ENCOUNTER — Encounter: Payer: Self-pay | Admitting: Podiatry

## 2021-03-21 ENCOUNTER — Ambulatory Visit: Payer: BC Managed Care – PPO | Admitting: Internal Medicine

## 2021-03-21 DIAGNOSIS — L03116 Cellulitis of left lower limb: Secondary | ICD-10-CM

## 2021-03-27 ENCOUNTER — Ambulatory Visit: Payer: BC Managed Care – PPO | Admitting: Podiatry

## 2021-03-27 ENCOUNTER — Other Ambulatory Visit: Payer: Self-pay

## 2021-03-27 DIAGNOSIS — L97522 Non-pressure chronic ulcer of other part of left foot with fat layer exposed: Secondary | ICD-10-CM | POA: Diagnosis not present

## 2021-03-27 MED ORDER — SANTYL 250 UNIT/GM EX OINT: TOPICAL_OINTMENT | CUTANEOUS | 0 refills | Status: DC

## 2021-03-27 NOTE — Progress Notes (Signed)
  Subjective:  Patient ID: Luke Gross, male    DOB: [birth year],  MRN: [id number]  Chief Complaint  Patient presents with  . Foot Problem    1-2 weeks now open wound on left foot. Was given 10 day abx and ointment by PCP. Initially started out as dry/irritated/painful skin.    35 y.o. male presents for wound care. Hx confirmed with patient. Unsure how it started but had pain at the area prior to the area opening up. Objective:  Physical Exam: Wound Location: dorsolateral foot left Wound Measurement: 1*2 Wound Base: Fibrotic slough Peri-wound: Normal Exudate: None: wound tissue dry wound without warmth, erythema, signs of acute infection Good pedal pulses, foot warm and well perfused Assessment:   1. Ulcerated, foot, left, with fat layer exposed (HCC)    Plan:  Patient was evaluated and treated and all questions answered.  Ulcer left foot -Offload ulcer with surgical shoe -Surgical shoe dispensed  -Gentle mechanical debridement with moist gauze today. Patient likely will not tolerate debridement in office setting. -Dressed with silvadene, gauze, tape today -Rx santyl. -Order wound supplies from [facility] -Will allow wound to heal by secndary intention for now; may benefit from STSG  Return in about 2 weeks (around 04/10/2021) for Wound Care, Left.

## 2021-03-28 ENCOUNTER — Other Ambulatory Visit: Payer: Self-pay | Admitting: Internal Medicine

## 2021-03-28 DIAGNOSIS — J4530 Mild persistent asthma, uncomplicated: Secondary | ICD-10-CM

## 2021-04-03 ENCOUNTER — Telehealth: Payer: Self-pay | Admitting: Podiatry

## 2021-04-03 NOTE — Telephone Encounter (Signed)
He should be able to use the manufacturer coupon code. He can look for it online or ask the pharmacy about it. Hopefully that should bring his copay down by several hundred dollars

## 2021-04-03 NOTE — Telephone Encounter (Signed)
Patient has requested a replacement for Santyl, stated the prescription Is over $500 and patient expressed concerns stating he needs something cheaper sent in to the [facility]. Please Advise

## 2021-04-03 NOTE — Telephone Encounter (Signed)
Called patient, informed about coupon

## 2021-04-10 ENCOUNTER — Ambulatory Visit: Payer: BC Managed Care – PPO | Admitting: Podiatry

## 2021-04-16 ENCOUNTER — Encounter: Payer: Self-pay | Admitting: Podiatry

## 2021-04-17 ENCOUNTER — Telehealth: Payer: Self-pay | Admitting: *Deleted

## 2021-04-17 NOTE — Telephone Encounter (Signed)
Patient has been scheduled for upcoming f/u appointment 04/18/21.

## 2021-04-17 NOTE — Telephone Encounter (Signed)
Patient is calling and thinks that his wound may be infected, is swelling daily,skin around the wound is sensitive to touch, painful,aches sometimes. No odor or streaking noticed.   Please advise,Please schedule for a sooner appointment(not scheduled for f/u visit until 06/24).

## 2021-04-18 ENCOUNTER — Other Ambulatory Visit: Payer: Self-pay

## 2021-04-18 ENCOUNTER — Ambulatory Visit: Payer: BC Managed Care – PPO | Admitting: Podiatry

## 2021-04-18 ENCOUNTER — Other Ambulatory Visit: Payer: Self-pay | Admitting: Sports Medicine

## 2021-04-18 DIAGNOSIS — L97522 Non-pressure chronic ulcer of other part of left foot with fat layer exposed: Secondary | ICD-10-CM | POA: Diagnosis not present

## 2021-04-18 MED ORDER — AMOXICILLIN-POT CLAVULANATE 875-125 MG PO TABS: 1.0000 | ORAL_TABLET | Freq: Two times a day (BID) | ORAL | 0 refills | Status: DC

## 2021-04-18 NOTE — Progress Notes (Signed)
Sent Augmentin for concern of wound infection

## 2021-04-20 ENCOUNTER — Encounter: Payer: Self-pay | Admitting: Podiatry

## 2021-04-20 NOTE — Progress Notes (Signed)
  Subjective:  Patient ID: Luke Gross, male    DOB: [birth year],  MRN: [id number]  Chief Complaint  Patient presents with   Wound Check    Wound care    35 y.o. male presents for wound care. Hx confirmed with patient.  He states he does have some pain.  He has been doing regular dressing changes.  He denies any other acute complaints.  He has been using Santyl Objective:  Physical Exam: Wound Location: dorsolateral foot left Wound Measurement: 1*2 Wound Base: Fibrotic slough Peri-wound: Normal Exudate: None: wound tissue dry wound without warmth, erythema, signs of acute infection Good pedal pulses, foot warm and well perfused Assessment:   1. Ulcerated, foot, left, with fat layer exposed (HCC)     Plan:  Patient was evaluated and treated and all questions answered.  Ulcer left foot -Offload ulcer with surgical shoe -Surgical shoe dispensed  -Gentle mechanical debridement with moist gauze today. Patient likely will not tolerate debridement in office setting. -Dressed with silvadene, gauze, tape today -Rx santyl. -Patient has wound supplies from [facility]. -Will allow wound to heal by secndary intention for now; may benefit from STSG -Follow-up with Dr. Samuella Cota in 2 weeks  No follow-ups on file.

## 2021-04-27 ENCOUNTER — Ambulatory Visit: Payer: BC Managed Care – PPO | Admitting: Podiatry

## 2021-04-30 ENCOUNTER — Other Ambulatory Visit: Payer: Self-pay | Admitting: Podiatry

## 2021-04-30 NOTE — Telephone Encounter (Signed)
Please advise 

## 2021-05-04 ENCOUNTER — Other Ambulatory Visit: Payer: Self-pay | Admitting: Internal Medicine

## 2021-05-04 DIAGNOSIS — E876 Hypokalemia: Secondary | ICD-10-CM

## 2021-05-04 DIAGNOSIS — J4530 Mild persistent asthma, uncomplicated: Secondary | ICD-10-CM

## 2021-05-04 DIAGNOSIS — T502X5A Adverse effect of carbonic-anhydrase inhibitors, benzothiadiazides and other diuretics, initial encounter: Secondary | ICD-10-CM

## 2021-05-04 DIAGNOSIS — I10 Essential (primary) hypertension: Secondary | ICD-10-CM

## 2021-05-15 ENCOUNTER — Ambulatory Visit: Payer: BC Managed Care – PPO | Admitting: Podiatry

## 2021-05-15 ENCOUNTER — Other Ambulatory Visit: Payer: Self-pay

## 2021-05-15 DIAGNOSIS — I70235 Atherosclerosis of native arteries of right leg with ulceration of other part of foot: Secondary | ICD-10-CM | POA: Diagnosis not present

## 2021-05-15 DIAGNOSIS — I70245 Atherosclerosis of native arteries of left leg with ulceration of other part of foot: Secondary | ICD-10-CM

## 2021-05-15 NOTE — Progress Notes (Signed)
  Subjective:  Patient ID: Luke Gross, male    DOB: January 25, 1986,  MRN: [id number]  Chief Complaint  Patient presents with   Foot Ulcer    Follow up left foot ulcer. Pt states his left foot wound is doing okay but states his right foot has an ulcer. Pt states he wants to have blood work done and have a better understanding of what is going on with his feet.   35 y.o. male presents for wound care. Hx confirmed with patient.   Objective:  Physical Exam: Wound Location: dorsolateral foot left Wound Measurement: 3.5*2.5 Wound Base: Fibrotic slough Peri-wound: Normal Exudate: None: wound tissue dry wound without warmth, erythema, signs of acute infection Good pedal pulses, foot warm and well perfused  Wound Location: dorsolateral foot right Wound Measurement: 1*0.5 Wound Base: Fibrotic slough Peri-wound: Normal Exudate: None: wound tissue dry wound without warmth, erythema, signs of acute infection    Assessment:   1. 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 Athscl native arteries of right leg w ulcer oth prt foot (HCC)    Plan:  Patient was evaluated and treated and all questions answered.  Ulcer left foot -He does have pedal pulses but this may be a microvascular issue. Order NIVS to eval for ischemic etiology. -Continue sandals or surgical shoes -Continue santyl. -Dressed with prisma and DSD today -Will eval for coverage for skin graft substitute to promote healing.   Return in about 2 weeks (around 05/29/2021) for Wound Care.

## 2021-05-16 ENCOUNTER — Encounter: Payer: Self-pay | Admitting: Internal Medicine

## 2021-05-16 ENCOUNTER — Ambulatory Visit: Payer: BC Managed Care – PPO | Admitting: Podiatry

## 2021-05-16 ENCOUNTER — Ambulatory Visit (HOSPITAL_COMMUNITY)
Admission: RE | Admit: 2021-05-16 | Discharge: 2021-05-16 | Disposition: A | Discharge status: Home / Self Care | Payer: BC Managed Care – PPO | Source: Ambulatory Visit | Attending: Podiatry | Admitting: Podiatry

## 2021-05-16 DIAGNOSIS — I70245 Atherosclerosis of native arteries of left leg with ulceration of other part of foot: Secondary | ICD-10-CM | POA: Diagnosis not present

## 2021-05-16 DIAGNOSIS — I70235 Atherosclerosis of native arteries of right leg with ulceration of other part of foot: Secondary | ICD-10-CM | POA: Diagnosis not present

## 2021-05-17 ENCOUNTER — Other Ambulatory Visit: Payer: Self-pay | Admitting: Internal Medicine

## 2021-05-17 DIAGNOSIS — J4530 Mild persistent asthma, uncomplicated: Secondary | ICD-10-CM

## 2021-05-17 MED ORDER — FLUTICASONE-SALMETEROL 500-50 MCG/ACT IN AEPB: 1.0000 | INHALATION_SPRAY | Freq: Two times a day (BID) | RESPIRATORY_TRACT | 1 refills | Status: DC

## 2021-05-29 ENCOUNTER — Ambulatory Visit: Payer: BC Managed Care – PPO | Admitting: Podiatry

## 2021-06-01 ENCOUNTER — Ambulatory Visit: Payer: BC Managed Care – PPO | Admitting: Podiatry

## 2021-06-01 ENCOUNTER — Encounter: Payer: Self-pay | Admitting: Internal Medicine

## 2021-06-01 ENCOUNTER — Other Ambulatory Visit: Payer: Self-pay

## 2021-06-01 DIAGNOSIS — L97522 Non-pressure chronic ulcer of other part of left foot with fat layer exposed: Secondary | ICD-10-CM

## 2021-06-01 DIAGNOSIS — I70245 Atherosclerosis of native arteries of left leg with ulceration of other part of foot: Secondary | ICD-10-CM

## 2021-06-01 NOTE — Progress Notes (Signed)
Subjective:  Patient ID: Luke Gross, male    DOB: 1986/06/13,  MRN: [id number]  Chief Complaint  Patient presents with   Wound Check    Pt states he would like to do some additional bloodwork as he is unsure why he is developing ulcerations.   35 y.o. male presents for wound care. Hx confirmed with patient.  Objective:  Physical Exam: Wound Location: left lateral foot Wound Measurement: 2.5x3 Wound Base: Granular/Healthy Peri-wound: Normal Exudate: None: wound tissue dry wound without warmth, erythema, signs of acute infection  Wound Location: right lateral foot Wound Measurement: 1x0.5 Wound Base: Fibrotic slough Peri-wound: Macerated Exudate: Scant/small amount Yellow/green exudate wound without warmth, erythema, signs of acute infection  Non-invasive vascular studies: Study Result  Bilae   LOWER EXTREMITY DOPPLER STUDY   Patient Name:  ADRIK KHIM  Date of Exam:   05/16/2021  Medical Rec #: [id number]             Accession #:    [id number]  Date of Birth: Nov 06, 1985            Patient Gender: M  Patient Age:   34Y  Exam Location:  [facility]  Procedure:      VAS [place] ABI WITH/WO TBI  Referring Phys: [id number] Gwyn Mehring J Aqeel Norgaard    ---------------------------------------------------------------------------  -----     Indications: Bilateral ulceration, and peripheral artery disease.   High Risk Factors: Hypertension, current smoker.     Comparison Study: No prior exam   Performing Technologist: Elita Quick RVT      Examination Guidelines: A complete evaluation includes at minimum, Doppler  waveform signals and systolic blood pressure reading at the level of  bilateral  brachial, anterior tibial, and posterior tibial arteries, when vessel  segments  are accessible. Bilateral testing is considered an integral part of a  complete  examination. Photoelectric Plethysmograph (PPG) waveforms and toe systolic  pressure readings are  included as required and additional duplex testing  as  needed. Limited examinations for reoccurring indications may be performed  as  noted.      ABI Findings:  +---------+------------------+-----+---------+--------+  Right    Rt Pressure (mmHg)IndexWaveform Comment   +---------+------------------+-----+---------+--------+  Brachial 144                                       +---------+------------------+-----+---------+--------+  PTA      176               1.22 triphasic          +---------+------------------+-----+---------+--------+  DP       176               1.22 triphasic          +---------+------------------+-----+---------+--------+  Great Toe119               0.83                    +---------+------------------+-----+---------+--------+   +---------+------------------+-----+---------+-------+  Left     Lt Pressure (mmHg)IndexWaveform Comment  +---------+------------------+-----+---------+-------+  Brachial 139                                      +---------+------------------+-----+---------+-------+  PTA      181  1.26 triphasic         +---------+------------------+-----+---------+-------+  DP       176               1.22 triphasic         +---------+------------------+-----+---------+-------+  Great Toe106               0.74 Normal            +---------+------------------+-----+---------+-------+   +-------+-----------+-----------+------------+------------+  ABI/TBIToday's ABIToday's TBIPrevious ABIPrevious TBI  +-------+-----------+-----------+------------+------------+  Right  1.22       0.83                                 +-------+-----------+-----------+------------+------------+  Left   1.26       0.74                                 +-------+-----------+-----------+------------+------------+             Summary:  Right: Resting right ankle-brachial index is  within normal range. No  evidence of significant right lower extremity arterial disease. The right  toe-brachial index is normal.   Left: Resting left ankle-brachial index is within normal range. No  evidence of significant left lower extremity arterial disease. The left  toe-brachial index is normal.    Assessment:   1. 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  Ulcer left lateral foot -Wound cleansed and debrided -Skin graft substitute applied to promote healing -Dressing applied consisting of sterile gauze and coban -Advised patient f/u with PCP to eval for DM. This ulcer pattern appears more related to arterial/venous etiology.   Procedure: Application Skin Graft Substitute Rationale: Wound in need of advanced wound therapy to accelerate healing Pre-Debridement Wound Measurements: 2.5x3 Post-Debridement Wound Measurements: same as pre-debridement. Type of Debridement: Selective Tissue Removed: Devitalized soft-tissue Instrumentation: 3 mm dermal curette Skin substitute: Puraply AM    Lot #: [id number]    Expiration: 08/07/23 Hydration: graft hydrated with saline Secondary Dressing: mepitel Disposition: Patient tolerated procedure well. Patient to return in 1 week for follow-up.  Return in about 1 week (around 06/08/2021) for Wound graft application.

## 2021-06-06 ENCOUNTER — Encounter: Payer: Self-pay | Admitting: Internal Medicine

## 2021-06-06 ENCOUNTER — Other Ambulatory Visit: Payer: Self-pay

## 2021-06-06 ENCOUNTER — Ambulatory Visit: Payer: BC Managed Care – PPO | Admitting: Internal Medicine

## 2021-06-06 VITALS — BP 142/90 | HR 69 | Temp 98.8°F | Resp 16 | Ht 67.0 in | Wt 236.0 lb

## 2021-06-06 DIAGNOSIS — L97519 Non-pressure chronic ulcer of other part of right foot with unspecified severity: Secondary | ICD-10-CM

## 2021-06-06 DIAGNOSIS — E876 Hypokalemia: Secondary | ICD-10-CM | POA: Diagnosis not present

## 2021-06-06 DIAGNOSIS — R739 Hyperglycemia, unspecified: Secondary | ICD-10-CM | POA: Diagnosis not present

## 2021-06-06 DIAGNOSIS — I10 Essential (primary) hypertension: Secondary | ICD-10-CM | POA: Diagnosis not present

## 2021-06-06 DIAGNOSIS — Z1159 Encounter for screening for other viral diseases: Secondary | ICD-10-CM | POA: Insufficient documentation

## 2021-06-06 DIAGNOSIS — L97529 Non-pressure chronic ulcer of other part of left foot with unspecified severity: Secondary | ICD-10-CM

## 2021-06-06 DIAGNOSIS — Z0000 Encounter for general adult medical examination without abnormal findings: Secondary | ICD-10-CM | POA: Diagnosis not present

## 2021-06-06 DIAGNOSIS — E559 Vitamin D deficiency, unspecified: Secondary | ICD-10-CM | POA: Diagnosis not present

## 2021-06-06 DIAGNOSIS — T502X5A Adverse effect of carbonic-anhydrase inhibitors, benzothiadiazides and other diuretics, initial encounter: Secondary | ICD-10-CM

## 2021-06-06 LAB — CBC WITH DIFFERENTIAL/PLATELET
Basophils Absolute: 0 10*3/uL (ref 0.0–0.1)
Basophils Relative: 0.3 % (ref 0.0–3.0)
Eosinophils Absolute: 0.2 10*3/uL (ref 0.0–0.7)
Eosinophils Relative: 4.3 % (ref 0.0–5.0)
HCT: 39.5 % (ref 39.0–52.0)
Hemoglobin: 13.1 g/dL (ref 13.0–17.0)
Lymphocytes Relative: 29.3 % (ref 12.0–46.0)
Lymphs Abs: 1.7 10*3/uL (ref 0.7–4.0)
MCHC: 33.2 g/dL (ref 30.0–36.0)
MCV: 89.1 fl (ref 78.0–100.0)
Monocytes Absolute: 0.9 10*3/uL (ref 0.1–1.0)
Monocytes Relative: 15.4 % — ABNORMAL HIGH (ref 3.0–12.0)
Neutro Abs: 2.9 10*3/uL (ref 1.4–7.7)
Neutrophils Relative %: 50.7 % (ref 43.0–77.0)
Platelets: 222 10*3/uL (ref 150.0–400.0)
RBC: 4.44 Mil/uL (ref 4.22–5.81)
RDW: 14 % (ref 11.5–15.5)
WBC: 5.7 10*3/uL (ref 4.0–10.5)

## 2021-06-06 LAB — TSH: TSH: 0.66 u[IU]/mL (ref 0.35–5.50)

## 2021-06-06 LAB — LIPID PANEL
Cholesterol: 152 mg/dL (ref 0–200)
HDL: 48.3 mg/dL (ref >39.00)
LDL Cholesterol: 92 mg/dL (ref 0–99)
NonHDL: 103.87
Total CHOL/HDL Ratio: 3
Triglycerides: 59 mg/dL (ref 0.0–149.0)
VLDL: 11.8 mg/dL (ref 0.0–40.0)

## 2021-06-06 LAB — BASIC METABOLIC PANEL
BUN: 17 mg/dL (ref 6–23)
CO2: 27 mEq/L (ref 19–32)
Calcium: 9.4 mg/dL (ref 8.4–10.5)
Chloride: 104 mEq/L (ref 96–112)
Creatinine, Ser: 0.87 mg/dL (ref 0.40–1.50)
GFR: 112.44 mL/min (ref >60.00)
Glucose, Bld: 94 mg/dL (ref 70–99)
Potassium: 3.6 mEq/L (ref 3.5–5.1)
Sodium: 140 mEq/L (ref 135–145)

## 2021-06-06 LAB — HEMOGLOBIN A1C: Hgb A1c MFr Bld: 6 % (ref 4.6–6.5)

## 2021-06-06 LAB — MAGNESIUM: Magnesium: 1.9 mg/dL (ref 1.5–2.5)

## 2021-06-06 NOTE — Patient Instructions (Signed)

## 2021-06-06 NOTE — Progress Notes (Signed)
Subjective:  Patient ID: Luke Gross, male    DOB: 1986/01/12  Age: 35 y.o. MRN: [id number]  CC: Annual Exam and Hypertension  This visit occurred during the SARS-CoV-2 public health emergency.  Safety protocols were in place, including screening questions prior to the visit, additional usage of staff PPE, and extensive cleaning of exam room while observing appropriate contact time as indicated for disinfecting solutions.    HPI Luke Gross presents for a CPX and f/up -   He has not been exercising much because he has bilateral foot pain.  He has been seeing a podiatrist for ulcerations on his feet and there is some concern for microvascular disease.  He tells me the macrovascular flow is good.  He denies claudication.  He tells me the ulcers are painful and he request something for the pain.  He denies headache, blurred vision, chest pain, shortness of breath, or diaphoresis.  He has been taking chlorthalidone but not carvedilol.  Outpatient Medications Prior to Visit  Medication Sig Dispense Refill   albuterol (PROVENTIL) (2.5 MG/3ML) 0.083% nebulizer solution USE 1 VIAL VIA NEBULIZER EVERY 4-6 HOURS AS NEEDED FOR COUGH OR WHEEZE 75 mL 5   albuterol (VENTOLIN HFA) 108 (90 Base) MCG/ACT inhaler INHALE 1 TO 2 PUFFS INTO THE LUNGS EVERY 6 HOURS AS NEEDED FOR WHEEZING OR SHORTNES OF BREATH 8.5 each 2   carvedilol (COREG) 3.125 MG tablet Take 1 tablet (3.125 mg total) by mouth 2 (two) times daily with a meal. Annual appt is due must see provider for future refills 60 tablet 0   chlorthalidone (HYGROTON) 25 MG tablet TAKE 1 TABLET BY MOUTH DAILY 90 tablet 0   Cholecalciferol 50 MCG (2000 UT) TABS Take 2 tablets (4,000 Units total) by mouth daily. 180 tablet 1   clobetasol ointment (TEMOVATE) 0.05 % Apply 1 application topically 2 (two) times daily. 45 g 1   collagenase (SANTYL) ointment APPLY NICKEL THICKNESS TO WOUND AREA FOLLOWED BE WET-TO-DRY DRESSING DAILY 30 g 0    fluticasone-salmeterol (WIXELA INHUB) 500-50 MCG/ACT AEPB Inhale 1 puff into the lungs in the morning and at bedtime. 180 each 1   KLOR-CON M20 20 MEQ tablet TAKE 1 TABLET BY MOUTH TWICE A DAY 180 tablet 1   montelukast (SINGULAIR) 10 MG tablet TAKE 1 TABLET BY MOUTH EVERYDAY AT BEDTIME 90 tablet 1   No facility-administered medications prior to visit.    ROS Review of Systems  Constitutional:  Negative for appetite change, diaphoresis, fatigue and unexpected weight change.  HENT: Negative.    Eyes: Negative.   Respiratory:  Negative for cough, chest tightness, shortness of breath and wheezing.   Cardiovascular:  Negative for chest pain, palpitations and leg swelling.  Gastrointestinal:  Negative for abdominal pain, constipation, diarrhea, nausea and vomiting.  Endocrine: Negative.   Genitourinary: Negative.  Negative for difficulty urinating.  Musculoskeletal: Negative.  Negative for back pain and myalgias.  Skin:  Positive for wound. Negative for color change.  Hematological:  Negative for adenopathy. Does not bruise/bleed easily.  Psychiatric/Behavioral: Negative.     Objective:  BP (!) 142/90 (BP Location: Left Arm, Patient Position: Sitting, Cuff Size: Large)   Pulse 69   Temp 98.8 F (37.1 C) (Oral)   Resp 16   Ht 5\' 7"  (1.702 m)   Wt 236 lb (107 kg)   SpO2 98%   BMI 36.96 kg/m   BP Readings from Last 3 Encounters:  06/06/21 (!) 142/90  02/21/21 126/76  01/17/21 138/76  Wt Readings from Last 3 Encounters:  06/06/21 236 lb (107 kg)  02/21/21 236 lb (107 kg)  01/17/21 240 lb (108.9 kg)    Physical Exam Vitals reviewed.  Constitutional:      Appearance: Normal appearance.  HENT:     Nose: Nose normal.     Mouth/Throat:     Mouth: Mucous membranes are moist.  Eyes:     Conjunctiva/sclera: Conjunctivae normal.  Cardiovascular:     Rate and Rhythm: Normal rate.     Heart sounds: No murmur heard. Pulmonary:     Effort: Pulmonary effort is normal.      Breath sounds: No stridor. No wheezing, rhonchi or rales.  Abdominal:     General: Abdomen is flat.     Palpations: There is no mass.     Tenderness: There is no abdominal tenderness. There is no guarding.     Hernia: No hernia is present.  Musculoskeletal:        General: Normal range of motion.     Cervical back: Neck supple.     Right lower leg: No edema.     Left lower leg: No edema.  Lymphadenopathy:     Cervical: No cervical adenopathy.  Skin:    General: Skin is warm and dry.     Comments: Both feet are covered with dressings.  Neurological:     Mental Status: He is alert. Mental status is at baseline.    Lab Results  Component Value Date   WBC 5.7 06/06/2021   HGB 13.1 06/06/2021   HCT 39.5 06/06/2021   PLT 222.0 06/06/2021   GLUCOSE 94 06/06/2021   CHOL 152 06/06/2021   TRIG 59.0 06/06/2021   HDL 48.30 06/06/2021   LDLCALC 92 06/06/2021   ALT 19 01/19/2020   AST 21 01/19/2020   NA 140 06/06/2021   K 3.6 06/06/2021   CL 104 06/06/2021   CREATININE 0.87 06/06/2021   BUN 17 06/06/2021   CO2 27 06/06/2021   TSH 0.66 06/06/2021   HGBA1C 6.0 06/06/2021    VAS [place] ABI WITH/WO TBI  Result Date: 05/17/2021 Bilae  LOWER EXTREMITY DOPPLER STUDY Patient Name:  Luke Gross  Date of Exam:   05/16/2021 Medical Rec #: [id number]             Accession #:    [id number] Date of Birth: 02-27-1986            Patient Gender: M Patient Age:   34Y Exam Location:  [facility] Procedure:      VAS [place] ABI WITH/WO TBI Referring Phys: [id number] MICHAEL J PRICE --------------------------------------------------------------------------------  Indications: Bilateral ulceration, and peripheral artery disease. High Risk Factors: Hypertension, current smoker.  Comparison Study: No prior exam Performing Technologist: Elita Quick RVT  Examination Guidelines: A complete evaluation includes at minimum, Doppler waveform signals and systolic blood pressure reading at the level of  bilateral brachial, anterior tibial, and posterior tibial arteries, when vessel segments are accessible. Bilateral testing is considered an integral part of a complete examination. Photoelectric Plethysmograph (PPG) waveforms and toe systolic pressure readings are included as required and additional duplex testing as needed. Limited examinations for reoccurring indications may be performed as noted.  ABI Findings: +---------+------------------+-----+---------+--------+ Right    Rt Pressure (mmHg)IndexWaveform Comment  +---------+------------------+-----+---------+--------+ Brachial 144                                      +---------+------------------+-----+---------+--------+  PTA      176               1.22 triphasic         +---------+------------------+-----+---------+--------+ DP       176               1.22 triphasic         +---------+------------------+-----+---------+--------+ Great Toe119               0.83                   +---------+------------------+-----+---------+--------+ +---------+------------------+-----+---------+-------+ Left     Lt Pressure (mmHg)IndexWaveform Comment +---------+------------------+-----+---------+-------+ Brachial 139                                     +---------+------------------+-----+---------+-------+ PTA      181               1.26 triphasic        +---------+------------------+-----+---------+-------+ DP       176               1.22 triphasic        +---------+------------------+-----+---------+-------+ Great Toe106               0.74 Normal           +---------+------------------+-----+---------+-------+ +-------+-----------+-----------+------------+------------+ ABI/TBIToday's ABIToday's TBIPrevious ABIPrevious TBI +-------+-----------+-----------+------------+------------+ Right  1.22       0.83                                +-------+-----------+-----------+------------+------------+ Left    1.26       0.74                                +-------+-----------+-----------+------------+------------+   Summary: Right: Resting right ankle-brachial index is within normal range. No evidence of significant right lower extremity arterial disease. The right toe-brachial index is normal. Left: Resting left ankle-brachial index is within normal range. No evidence of significant left lower extremity arterial disease. The left toe-brachial index is normal.  *See table(s) above for measurements and observations.  Electronically signed by Fabienne Bruns MD on 05/17/2021 at 10:53:39 AM.    Final     Assessment & Plan:   Luke Gross was seen today for annual exam and hypertension.  Diagnoses and all orders for this visit:  Essential hypertension, benign- His blood pressure is not adequately well controlled.  Will restart carvedilol and continue chlorthalidone. -     CBC with Differential/Platelet; Future -     Magnesium; Future -     TSH; Future -     TSH -     Magnesium -     CBC with Differential/Platelet  Diuretic-induced hypokalemia- His K+ is normal. -     Basic metabolic panel; Future -     Magnesium; Future -     Magnesium -     Basic metabolic panel  Vitamin D deficiency  Routine general medical examination at a health care facility- Exam completed, labs reviewed, vaccines are up-to-date, no cancer screenings indicated, patient education was given. -     Lipid panel; Future -     Hepatitis C antibody; Future -     Hepatitis C antibody -     Lipid panel  Chronic hyperglycemia-  He is prediabetic.  Medical therapy is not indicated. -     Basic metabolic panel; Future -     Hemoglobin A1c; Future -     Hemoglobin A1c -     Basic metabolic panel  Need for hepatitis C screening test -     Hepatitis C antibody; Future -     Hepatitis C antibody  Skin ulcers of foot, bilateral (HCC) -     traMADol (ULTRAM) 50 MG tablet; Take 1 tablet (50 mg total) by mouth every 6 (six) hours  as needed.  I am having Luke L. Daphine Deutscher "Thayer Ohm" start on traMADol. I am also having him maintain his albuterol, carvedilol, Cholecalciferol, clobetasol ointment, montelukast, Santyl, chlorthalidone, albuterol, Klor-Con M20, and fluticasone-salmeterol.  Meds ordered this encounter  Medications   traMADol (ULTRAM) 50 MG tablet    Sig: Take 1 tablet (50 mg total) by mouth every 6 (six) hours as needed.    Dispense:  75 tablet    Refill:  1      Follow-up: Return in about 3 months (around 09/06/2021).  Sanda Linger, MD

## 2021-06-07 DIAGNOSIS — L97529 Non-pressure chronic ulcer of other part of left foot with unspecified severity: Secondary | ICD-10-CM | POA: Insufficient documentation

## 2021-06-07 DIAGNOSIS — L97519 Non-pressure chronic ulcer of other part of right foot with unspecified severity: Secondary | ICD-10-CM | POA: Insufficient documentation

## 2021-06-07 LAB — HEPATITIS C ANTIBODY
Hepatitis C Ab: NONREACTIVE
SIGNAL TO CUT-OFF: 0.03 (ref <1.00)

## 2021-06-07 MED ORDER — TRAMADOL HCL 50 MG PO TABS: 50.0000 mg | ORAL_TABLET | Freq: Four times a day (QID) | ORAL | 1 refills | Status: DC | PRN

## 2021-06-08 ENCOUNTER — Ambulatory Visit: Payer: BC Managed Care – PPO | Admitting: Podiatry

## 2021-06-08 ENCOUNTER — Other Ambulatory Visit: Payer: Self-pay

## 2021-06-08 DIAGNOSIS — I70245 Atherosclerosis of native arteries of left leg with ulceration of other part of foot: Secondary | ICD-10-CM

## 2021-06-08 DIAGNOSIS — I872 Venous insufficiency (chronic) (peripheral): Secondary | ICD-10-CM | POA: Diagnosis not present

## 2021-06-08 MED ORDER — CARVEDILOL 3.125 MG PO TABS: 3.1250 mg | ORAL_TABLET | Freq: Two times a day (BID) | ORAL | 0 refills | Status: DC

## 2021-06-08 NOTE — Progress Notes (Signed)
  Subjective:  Patient ID: Luke Gross, male    DOB: 1986-08-11,  MRN: [id number]  Chief Complaint  Patient presents with   Follow-up    Wound graft application   35 y.o. male presents for wound care. Hx confirmed with patient.  States he had pain in his right foot since last visit with bleeding to both wounds.  Was given pain medication by his PCP had lab work done that showed he was prediabetic. Objective:  Physical Exam: Wound Location: left lateral foot Wound Measurement: 2.5*2.7*0.2 Wound Base: Granular/Healthy Peri-wound: Normal Exudate: None: wound tissue dry wound without warmth, erythema, signs of acute infection  Wound Location: right lateral foot Wound Measurement: 1.6*2.3*0.3 Wound Base: Fibrotic slough Peri-wound: Macerated Exudate: Scant/small amount Yellow/green exudate wound without warmth, erythema, signs of acute infection  Assessment:   1. 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 Venous reflux      Plan:  Patient was evaluated and treated and all questions answered.  Ulcer left lateral foot -Wound mechanically debrided and cleansed. -Skin graft substitute reapplied  Procedure: Application Skin Graft Substitute Rationale: Wound in need of advanced wound therapy to accelerate healing Pre-Debridement Wound Measurements: 2.5 cm x 2.7 cm x 0.2 cm  Post-Debridement Wound Measurements: same as pre-debridement. Type of Debridement: Selective Tissue Removed: Devitalized soft-tissue Instrumentation: 3 mm dermal curette Skin substitute: Puraply AM    Lot #: [id number]    Expiration: 08/22/23 Hydration: graft hydrated with saline Secondary Dressing: mepitel, sterile gauze, kerlix, ABD PAD, and coban Disposition: Patient tolerated procedure well. Patient to return in 1 week for follow-up.  Venous Dz with Reflux -Order venous study for eval     No follow-ups on file.

## 2021-06-11 ENCOUNTER — Encounter: Payer: Self-pay | Admitting: Podiatry

## 2021-06-12 ENCOUNTER — Other Ambulatory Visit: Payer: Self-pay | Admitting: Podiatry

## 2021-06-12 DIAGNOSIS — L97522 Non-pressure chronic ulcer of other part of left foot with fat layer exposed: Secondary | ICD-10-CM

## 2021-06-16 ENCOUNTER — Encounter: Payer: Self-pay | Admitting: Podiatry

## 2021-06-19 ENCOUNTER — Ambulatory Visit: Payer: BC Managed Care – PPO | Admitting: Podiatry

## 2021-06-22 ENCOUNTER — Encounter: Payer: Self-pay | Admitting: Podiatry

## 2021-06-22 ENCOUNTER — Other Ambulatory Visit: Payer: Self-pay

## 2021-06-22 ENCOUNTER — Ambulatory Visit (HOSPITAL_COMMUNITY)
Admission: RE | Admit: 2021-06-22 | Discharge: 2021-06-22 | Disposition: A | Discharge status: Home / Self Care | Payer: BC Managed Care – PPO | Source: Ambulatory Visit | Attending: Podiatry | Admitting: Podiatry

## 2021-06-22 DIAGNOSIS — I872 Venous insufficiency (chronic) (peripheral): Secondary | ICD-10-CM | POA: Diagnosis present

## 2021-06-26 ENCOUNTER — Ambulatory Visit: Payer: BC Managed Care – PPO | Admitting: Podiatry

## 2021-06-26 ENCOUNTER — Other Ambulatory Visit: Payer: Self-pay

## 2021-06-26 ENCOUNTER — Other Ambulatory Visit: Payer: Self-pay | Admitting: Internal Medicine

## 2021-06-26 DIAGNOSIS — I872 Venous insufficiency (chronic) (peripheral): Secondary | ICD-10-CM

## 2021-06-26 DIAGNOSIS — I70245 Atherosclerosis of native arteries of left leg with ulceration of other part of foot: Secondary | ICD-10-CM | POA: Diagnosis not present

## 2021-06-26 DIAGNOSIS — L280 Lichen simplex chronicus: Secondary | ICD-10-CM

## 2021-06-26 DIAGNOSIS — I10 Essential (primary) hypertension: Secondary | ICD-10-CM

## 2021-06-26 NOTE — Progress Notes (Signed)
  Subjective:  Patient ID: Luke Gross, male    DOB: Oct 22, 1986,  MRN: [id number]  Chief Complaint  Patient presents with   Wound Check    Wound care   35 y.o. male presents for wound care. Hx confirmed with patient. States the wounds are feeling better. Has changed the outer dressing since last seen. Is now back at work. Objective:  Physical Exam: Wound Location: left lateral foot Wound Measurement: 2.5*1.5 Wound Base: Granular/Healthy Peri-wound: Normal Exudate: None: wound tissue dry wound without warmth, erythema, signs of acute infection  Wound Location: right lateral foot Wound Measurement: 1.5*2 Wound Base: Fibrotic slough Peri-wound: Macerated Exudate: Scant/small amount Yellow/green exudate wound without warmth, erythema, signs of acute infection  Assessment:   1. 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 Venous reflux    Plan:  Patient was evaluated and treated and all questions answered.  Ulcer left lateral foot, 2/2 PAD? -Wounds improving. Mechanically debrided with moist gauze. Skin graft substitute applied to promote healing.  Procedure: Application Skin Graft Substitute Rationale: Wound in need of advanced wound therapy to accelerate healing Pre-Debridement Wound Measurements: 2.5 cm x 1.5 cm x 0.2 cm  Post-Debridement Wound Measurements: same as pre-debridement. Type of Debridement: Selective Tissue Removed: Devitalized soft-tissue Instrumentation: 3 mm dermal curette Skin substitute: Puraply AM    Lot #: [id number]    Expiration: 08/22/23 Hydration: graft hydrated with saline Secondary Dressing: mepitel, sterile gauze, kerlix, ABD PAD, and coban Disposition: Patient tolerated procedure well. Patient to return in 1 week for follow-up.  Venous Dz with Reflux -Reviewed results with patient. He does have some proximal reflux but unclear if this is the underlying cause of the ulceration.Will discuss further  possible etiologies.   No follow-ups on file.

## 2021-07-06 ENCOUNTER — Ambulatory Visit: Payer: BC Managed Care – PPO | Admitting: Podiatry

## 2021-07-06 ENCOUNTER — Other Ambulatory Visit: Payer: Self-pay

## 2021-07-06 DIAGNOSIS — I70245 Atherosclerosis of native arteries of left leg with ulceration of other part of foot: Secondary | ICD-10-CM

## 2021-07-06 DIAGNOSIS — D571 Sickle-cell disease without crisis: Secondary | ICD-10-CM

## 2021-07-06 NOTE — Progress Notes (Signed)
  Subjective:  Patient ID: Luke Gross, male    DOB: [birth year],  MRN: [id number]  Chief Complaint  Patient presents with   Foot Ulcer    Left, 2 week follow up   35 y.o. male presents for wound care. Hx confirmed with patient. Has changed the dressing once but states it has not drained much. Objective:  Physical Exam: Wound Location: left lateral foot Wound Measurement: 2*1 Wound Base: Granular Peri-wound: Normal Exudate: None: wound tissue dry wound without warmth, erythema, signs of acute infection  Wound Location: right lateral foot Wound Measurement: 1.5*2 Wound Base: granular Peri-wound: Macerated Exudate: Scant/small amount Yellow/green exudate wound without warmth, erythema, signs of acute infection  Assessment:   1. 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  Ulcer left lateral foot, 2/2 PAD and Venous Reflux -Order sickle cell screen for eval -Wounds continue to improve. Gentle debridement with moist gauze. Reapply puraply AM to promote healing.  Procedure: Application Skin Graft Substitute Rationale: Wound in need of advanced wound therapy to accelerate healing Pre-Debridement Wound Measurements: 2 cm x 1 cm x 0.2 cm  Post-Debridement Wound Measurements: same as pre-debridement. Type of Debridement: Selective Tissue Removed: Devitalized soft-tissue Instrumentation: 3 mm dermal curette Skin substitute: Puraply AM    Lot #: [id number]    Expiration: 08/22/23 Hydration: graft hydrated with saline Secondary Dressing: mepitel, sterile gauze, kerlix, and ACE bandage Disposition: Patient tolerated procedure well. Patient to return in 1 week for follow-up.  No follow-ups on file.

## 2021-07-20 ENCOUNTER — Other Ambulatory Visit: Payer: Self-pay

## 2021-07-20 ENCOUNTER — Ambulatory Visit: Payer: BC Managed Care – PPO | Admitting: Podiatry

## 2021-07-20 DIAGNOSIS — I70245 Atherosclerosis of native arteries of left leg with ulceration of other part of foot: Secondary | ICD-10-CM | POA: Diagnosis not present

## 2021-07-20 DIAGNOSIS — I872 Venous insufficiency (chronic) (peripheral): Secondary | ICD-10-CM

## 2021-07-20 DIAGNOSIS — D571 Sickle-cell disease without crisis: Secondary | ICD-10-CM | POA: Diagnosis not present

## 2021-07-20 NOTE — Progress Notes (Signed)
  Subjective:  Patient ID: Luke Gross, male    DOB: February 18, 1986,  MRN: [id number]  Chief Complaint  Patient presents with   Wound Check    Denies fever/chills/nausea/vomiting. No new concerns.   35 y.o. male presents for wound care. Hx confirmed with patient.  Denies new issues with the feet has not changed the dressings. Objective:  Physical Exam: Wound Location: left lateral foot Wound Measurement: 2*1 Wound Base: Granular Peri-wound: Normal Exudate: None: wound tissue dry wound without warmth, erythema, signs of acute infection  Wound Location: right lateral foot Wound Measurement: 2*2  Wound Base: granular Peri-wound: Macerated Exudate: Scant/small amount Yellow/green exudate wound without warmth, erythema, signs of acute infection  Assessment:   1. 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 Sickle cell disease without crisis (HCC)   3. Venous reflux     Plan:  Patient was evaluated and treated and all questions answered.  For tomorrow ulcer left lateral foot, 2/2 PAD and Venous Reflux -Ulcers appear reasonably improved.  No graft available today for application.  We will plan for repeat application next visit.  We will give patient a slight break allow him to shower and get the wounds wet.  Patient to dress with Silvadene and dry sterile dressing daily.  Follow-up in 2 weeks for recheck  Return in about 2 weeks (around 08/03/2021) for Wound Care.

## 2021-08-07 ENCOUNTER — Ambulatory Visit: Payer: BC Managed Care – PPO | Admitting: Podiatry

## 2021-08-26 ENCOUNTER — Other Ambulatory Visit: Payer: Self-pay | Admitting: Internal Medicine

## 2021-08-26 DIAGNOSIS — J4530 Mild persistent asthma, uncomplicated: Secondary | ICD-10-CM

## 2021-08-31 ENCOUNTER — Encounter: Payer: Self-pay | Admitting: Internal Medicine

## 2021-08-31 ENCOUNTER — Ambulatory Visit: Payer: BC Managed Care – PPO | Admitting: Podiatry

## 2021-08-31 ENCOUNTER — Other Ambulatory Visit: Payer: Self-pay

## 2021-08-31 DIAGNOSIS — I872 Venous insufficiency (chronic) (peripheral): Secondary | ICD-10-CM

## 2021-08-31 DIAGNOSIS — D571 Sickle-cell disease without crisis: Secondary | ICD-10-CM | POA: Diagnosis not present

## 2021-08-31 DIAGNOSIS — I70235 Atherosclerosis of native arteries of right leg with ulceration of other part of foot: Secondary | ICD-10-CM

## 2021-08-31 DIAGNOSIS — I70245 Atherosclerosis of native arteries of left leg with ulceration of other part of foot: Secondary | ICD-10-CM | POA: Diagnosis not present

## 2021-08-31 NOTE — Progress Notes (Signed)
  Subjective:  Patient ID: Luke Gross, male    DOB: [birth year],  MRN: [id number]  Chief Complaint  Patient presents with   Wound Check    No concerns voiced today.    35 y.o. male presents for wound care. Hx confirmed with patient.  Has been applying neosporin and bandages and think they are doing better. Objective:  Physical Exam: Wound Location: left lateral foot Wound Measurement: - Wound Base: epithelialized Peri-wound: callused Exudate: None: wound tissue dry wound without warmth, erythema, signs of acute infection  Wound Location: right lateral foot Wound Measurement:  Wound Base: epithelialized  Peri-wound: callused Exudate: none wound without warmth, erythema, signs of acute infection  Assessment:   1. 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 Sickle cell disease without crisis (HCC)   3. Venous reflux   4. Athscl native arteries of right leg w ulcer oth prt foot (HCC)    Plan:  Patient was evaluated and treated and all questions answered.  Bilateral ulcer lateral foot, 2/2 PAD and Venous Reflux -Wounds appear healed. Minimally debrided HPK. No dressing applied. Patient to continue normal shoegear. Advised he f/u in 6 weeks if these reopen, otherwise f/u PRN -Encouraged him to get the Sickle test we previously discussed. Order was sent to [facility]  Return in about 6 weeks (around 10/12/2021) for wound check.

## 2021-09-01 LAB — SICKLE CELL SCREEN: Sickle Solubility Test - HGBRFX: NEGATIVE

## 2021-09-18 DIAGNOSIS — M79676 Pain in unspecified toe(s): Secondary | ICD-10-CM

## 2021-09-26 ENCOUNTER — Other Ambulatory Visit: Payer: Self-pay | Admitting: Internal Medicine

## 2021-09-26 DIAGNOSIS — J4530 Mild persistent asthma, uncomplicated: Secondary | ICD-10-CM

## 2021-09-27 ENCOUNTER — Other Ambulatory Visit: Payer: Self-pay | Admitting: Internal Medicine

## 2021-09-27 DIAGNOSIS — I10 Essential (primary) hypertension: Secondary | ICD-10-CM

## 2021-10-12 ENCOUNTER — Encounter: Payer: Self-pay | Admitting: Internal Medicine

## 2021-10-12 ENCOUNTER — Ambulatory Visit: Payer: BC Managed Care – PPO | Admitting: Podiatry

## 2021-10-12 DIAGNOSIS — J4530 Mild persistent asthma, uncomplicated: Secondary | ICD-10-CM

## 2021-10-12 MED ORDER — FLUTICASONE-SALMETEROL 500-50 MCG/ACT IN AEPB: 1.0000 | INHALATION_SPRAY | Freq: Two times a day (BID) | RESPIRATORY_TRACT | 0 refills | Status: DC

## 2021-10-31 ENCOUNTER — Other Ambulatory Visit: Payer: Self-pay | Admitting: Internal Medicine

## 2021-10-31 DIAGNOSIS — I10 Essential (primary) hypertension: Secondary | ICD-10-CM

## 2022-01-01 ENCOUNTER — Other Ambulatory Visit: Payer: Self-pay | Admitting: Internal Medicine

## 2022-01-01 DIAGNOSIS — I10 Essential (primary) hypertension: Secondary | ICD-10-CM

## 2022-01-26 ENCOUNTER — Other Ambulatory Visit: Payer: Self-pay | Admitting: Internal Medicine

## 2022-01-26 DIAGNOSIS — I10 Essential (primary) hypertension: Secondary | ICD-10-CM

## 2022-01-28 ENCOUNTER — Other Ambulatory Visit: Payer: Self-pay | Admitting: Internal Medicine

## 2022-01-28 DIAGNOSIS — J4530 Mild persistent asthma, uncomplicated: Secondary | ICD-10-CM

## 2022-01-28 DIAGNOSIS — L280 Lichen simplex chronicus: Secondary | ICD-10-CM

## 2022-02-19 ENCOUNTER — Encounter: Payer: Self-pay | Admitting: Internal Medicine

## 2022-02-19 ENCOUNTER — Ambulatory Visit: Payer: BC Managed Care – PPO | Admitting: Internal Medicine

## 2022-02-19 VITALS — BP 128/82 | HR 64 | Temp 98.0°F | Resp 16 | Ht 67.0 in | Wt 237.0 lb

## 2022-02-19 DIAGNOSIS — I10 Essential (primary) hypertension: Secondary | ICD-10-CM | POA: Diagnosis not present

## 2022-02-19 DIAGNOSIS — R739 Hyperglycemia, unspecified: Secondary | ICD-10-CM | POA: Diagnosis not present

## 2022-02-19 LAB — BASIC METABOLIC PANEL
BUN: 24 mg/dL — ABNORMAL HIGH (ref 6–23)
CO2: 28 mEq/L (ref 19–32)
Calcium: 9.6 mg/dL (ref 8.4–10.5)
Chloride: 101 mEq/L (ref 96–112)
Creatinine, Ser: 0.85 mg/dL (ref 0.40–1.50)
GFR: 112.67 mL/min (ref >60.00)
Glucose, Bld: 95 mg/dL (ref 70–99)
Potassium: 3.7 mEq/L (ref 3.5–5.1)
Sodium: 137 mEq/L (ref 135–145)

## 2022-02-19 LAB — CBC WITH DIFFERENTIAL/PLATELET
Basophils Absolute: 0 10*3/uL (ref 0.0–0.1)
Basophils Relative: 0.6 % (ref 0.0–3.0)
Eosinophils Absolute: 0.4 10*3/uL (ref 0.0–0.7)
Eosinophils Relative: 6.9 % — ABNORMAL HIGH (ref 0.0–5.0)
HCT: 42 % (ref 39.0–52.0)
Hemoglobin: 13.8 g/dL (ref 13.0–17.0)
Lymphocytes Relative: 33.3 % (ref 12.0–46.0)
Lymphs Abs: 1.8 10*3/uL (ref 0.7–4.0)
MCHC: 32.9 g/dL (ref 30.0–36.0)
MCV: 89.9 fl (ref 78.0–100.0)
Monocytes Absolute: 1 10*3/uL (ref 0.1–1.0)
Monocytes Relative: 17.3 % — ABNORMAL HIGH (ref 3.0–12.0)
Neutro Abs: 2.3 10*3/uL (ref 1.4–7.7)
Neutrophils Relative %: 41.9 % — ABNORMAL LOW (ref 43.0–77.0)
Platelets: 222 10*3/uL (ref 150.0–400.0)
RBC: 4.68 Mil/uL (ref 4.22–5.81)
RDW: 13.4 % (ref 11.5–15.5)
WBC: 5.5 10*3/uL (ref 4.0–10.5)

## 2022-02-19 LAB — HEMOGLOBIN A1C: Hgb A1c MFr Bld: 6.2 % (ref 4.6–6.5)

## 2022-02-19 MED ORDER — CARVEDILOL 3.125 MG PO TABS: ORAL_TABLET | ORAL | 1 refills | Status: DC

## 2022-02-19 NOTE — Progress Notes (Signed)
? ?Subjective:  ?Patient ID: Luke Gross, male    DOB: 19-Jun-1986  Age: 36 y.o. MRN: [id number] ? ?CC: Hypertension ? ? ?HPI ?Luke Glassing presents for f/up -  ? ?He tells me his blood pressure has been well controlled.  He denies headache, blurred vision, chest pain, shortness of breath, edema, or dyspnea on exertion.  He has had skin grafting done to his feet and the areas have healed nicely. ? ?Outpatient Medications Prior to Visit  ?Medication Sig Dispense Refill  ? albuterol (PROVENTIL) (2.5 MG/3ML) 0.083% nebulizer solution USE 1 VIAL VIA NEBULIZER EVERY 4-6 HOURS AS NEEDED FOR COUGH OR WHEEZE 75 mL 5  ? albuterol (VENTOLIN HFA) 108 (90 Base) MCG/ACT inhaler INHALE 1 TO 2 PUFFS INTO THE LUNGS EVERY 6 HOURS AS NEEDED FOR WHEEZING OR SHORTNES OF BREATH 8.5 each 2  ? chlorthalidone (HYGROTON) 25 MG tablet TAKE 1 TABLET BY MOUTH EVERY DAY 90 tablet 0  ? Cholecalciferol 50 MCG (2000 UT) TABS Take 2 tablets (4,000 Units total) by mouth daily. 180 tablet 1  ? clobetasol ointment (TEMOVATE) 0.05 % APPLY TO AFFECTED AREA TWICE A DAY 45 g 1  ? collagenase (SANTYL) ointment APPLY NICKEL THICKNESS TO WOUND AREA FOLLOWED BE WET-TO-DRY DRESSING DAILY 30 g 0  ? KLOR-CON M20 20 MEQ tablet TAKE 1 TABLET BY MOUTH TWICE A DAY 180 tablet 1  ? montelukast (SINGULAIR) 10 MG tablet TAKE 1 TABLET BY MOUTH EVERYDAY AT BEDTIME 90 tablet 1  ? traMADol (ULTRAM) 50 MG tablet Take 1 tablet (50 mg total) by mouth every 6 (six) hours as needed. 75 tablet 1  ? WIXELA INHUB 500-50 MCG/ACT AEPB INHALE 1 PUFF INTO THE LUNGS IN THE MORNING AND AT BEDTIME. 180 each 1  ? carvedilol (COREG) 3.125 MG tablet TAKE 1 TABLET BY MOUTH 2 TIMES DAILY WITH A MEAL. ANNUAL APPT IS DUE 180 tablet 0  ? ?No facility-administered medications prior to visit.  ? ? ?ROS ?Review of Systems  ?Constitutional:  Negative for chills, diaphoresis, fatigue and fever.  ?HENT: Negative.    ?Eyes: Negative.   ?Respiratory:  Negative for cough, chest tightness,  shortness of breath and wheezing.   ?Cardiovascular:  Negative for chest pain, palpitations and leg swelling.  ?Gastrointestinal:  Negative for abdominal pain, constipation, diarrhea, nausea and vomiting.  ?Endocrine: Negative.   ?Genitourinary: Negative.   ?Musculoskeletal: Negative.   ?Skin: Negative.   ?Neurological:  Negative for dizziness, weakness, light-headedness and headaches.  ?Hematological:  Negative for adenopathy. Does not bruise/bleed easily.  ?Psychiatric/Behavioral: Negative.    ? ?Objective:  ?BP 128/82 (BP Location: Left Arm, Patient Position: Sitting, Cuff Size: Large)   Pulse 64   Temp 98 ?F (36.7 ?C) (Oral)   Resp 16   Ht 5\' 7"  (1.702 m)   Wt 237 lb (107.5 kg)   SpO2 99%   BMI 37.12 kg/m?  ? ?BP Readings from Last 3 Encounters:  ?02/19/22 128/82  ?06/06/21 (!) 142/90  ?02/21/21 126/76  ? ? ?Wt Readings from Last 3 Encounters:  ?02/19/22 237 lb (107.5 kg)  ?06/06/21 236 lb (107 kg)  ?02/21/21 236 lb (107 kg)  ? ? ?Physical Exam ?Vitals reviewed.  ?HENT:  ?   Nose: Nose normal.  ?   Mouth/Throat:  ?   Mouth: Mucous membranes are moist.  ?Eyes:  ?   General: No scleral icterus. ?   Conjunctiva/sclera: Conjunctivae normal.  ?Cardiovascular:  ?   Rate and Rhythm: Normal rate and regular rhythm.  ?  Pulses:     ?     Dorsalis pedis pulses are 1+ on the right side and 1+ on the left side.  ?     Posterior tibial pulses are 1+ on the right side and 1+ on the left side.  ?   Heart sounds: No murmur heard. ?Pulmonary:  ?   Effort: Pulmonary effort is normal.  ?   Breath sounds: No stridor. No wheezing, rhonchi or rales.  ?Abdominal:  ?   General: Abdomen is flat.  ?   Palpations: There is no mass.  ?   Tenderness: There is no abdominal tenderness. There is no guarding.  ?   Hernia: No hernia is present.  ?Musculoskeletal:     ?   General: Normal range of motion.  ?   Cervical back: Neck supple.  ?   Right lower leg: No edema.  ?   Left lower leg: No edema.  ?Feet:  ?   Right foot:  ?   Skin  integrity: Skin integrity normal. No ulcer, skin breakdown, erythema or warmth.  ?   Toenail Condition: Right toenails are normal.  ?   Left foot:  ?   Skin integrity: Skin integrity normal. No ulcer, skin breakdown, erythema or warmth.  ?   Toenail Condition: Left toenails are normal.  ?   Comments: There is some postinflammatory pigmentation but all the sites have healed nicely.  There are no ulcers. ?Lymphadenopathy:  ?   Cervical: No cervical adenopathy.  ?Skin: ?   General: Skin is warm and dry.  ?Neurological:  ?   General: No focal deficit present.  ?   Mental Status: He is alert.  ?Psychiatric:     ?   Mood and Affect: Mood normal.     ?   Behavior: Behavior normal.  ? ? ?Lab Results  ?Component Value Date  ? WBC 5.5 02/19/2022  ? HGB 13.8 02/19/2022  ? HCT 42.0 02/19/2022  ? PLT 222.0 02/19/2022  ? GLUCOSE 95 02/19/2022  ? CHOL 152 06/06/2021  ? TRIG 59.0 06/06/2021  ? HDL 48.30 06/06/2021  ? LDLCALC 92 06/06/2021  ? ALT 19 01/19/2020  ? AST 21 01/19/2020  ? NA 137 02/19/2022  ? K 3.7 02/19/2022  ? CL 101 02/19/2022  ? CREATININE 0.85 02/19/2022  ? BUN 24 (H) 02/19/2022  ? CO2 28 02/19/2022  ? TSH 0.66 06/06/2021  ? HGBA1C 6.2 02/19/2022  ? ? ?VAS US LOWER EXTREMITY VENOUS REFLUX ? ?Result Date: 06/22/2021 ? Lower Venous Reflux Study Patient Name:  Luke Gross  Date of Exam:   06/22/2021 Medical Rec #: [id number]             Accession #:    [id number] Date of Birth: [birth year]            Patient Gender: M Patient Age:   [age] years [facility] Procedure:      VAS US LOWER EXTREMITY VENOUS REFLUX Referring Phys: Casimiro NeedleMICHAEL PRICE --------------------------------------------------------------------------------  Indications: Ulceration.  Comparison Study: None Performing Technologist: Ethelle LyonMegan Krolak  Examination Guidelines: A complete evaluation includes B-mode imaging, spectral Doppler, color Doppler, and power Doppler as needed of all accessible portions of each vessel.  Bilateral testing is considered an integral part of a complete examination. Limited examinations for reoccurring indications may be performed as noted. The reflux portion of the exam is performed with the patient in reverse Trendelenburg. Significant venous reflux is defined as >500 ms in  the superficial venous system, and >1 second in the deep venous system.  +--------------+---------+------+-----------+------------+--------+ LEFT          Reflux NoRefluxReflux TimeDiameter cmsComments                         Yes                                  +--------------+---------+------+-----------+------------+--------+ CFV           no                                             +--------------+---------+------+-----------+------------+--------+ FV mid        no                                             +--------------+---------+------+-----------+------------+--------+ Popliteal     no                                             +--------------+---------+------+-----------+------------+--------+ GSV at [facility]    no                            0.47             +--------------+---------+------+-----------+------------+--------+ GSV prox thigh          yes    >500 ms      0.38             +--------------+---------+------+-----------+------------+--------+ GSV mid thigh           yes    >500 ms      0.42             +--------------+---------+------+-----------+------------+--------+ GSV dist thigh          yes    >500 ms      0.41             +--------------+---------+------+-----------+------------+--------+ GSV at knee             yes    >500 ms      0.38             +--------------+---------+------+-----------+------------+--------+ GSV prox calf           yes    >500 ms      0.35             +--------------+---------+------+-----------+------------+--------+ SSV Pop Fossa no                            0.15              +--------------+---------+------+-----------+------------+--------+ SSV prox calf no                            0.18             +--------------+---------+------+-----------+------------+--------+ SSV mid calf  no  0.32             +--------------+---------+------+-----------+------------+--------+

## 2022-02-19 NOTE — Patient Instructions (Signed)
Hypertension, Adult High blood pressure (hypertension) is when the force of blood pumping through the arteries is too strong. The arteries are the blood vessels that carry blood from the heart throughout the body. Hypertension forces the heart to work harder to pump blood and may cause arteries to become narrow or stiff. Untreated or uncontrolled hypertension can lead to a heart attack, heart failure, a stroke, kidney disease, and other problems. A blood pressure reading consists of a higher number over a lower number. Ideally, your blood pressure should be below 120/80. The first ("top") number is called the systolic pressure. It is a measure of the pressure in your arteries as your heart beats. The second ("bottom") number is called the diastolic pressure. It is a measure of the pressure in your arteries as the heart relaxes. What are the causes? The exact cause of this condition is not known. There are some conditions that result in high blood pressure. What increases the risk? Certain factors may make you more likely to develop high blood pressure. Some of these risk factors are under your control, including: Smoking. Not getting enough exercise or physical activity. Being overweight. Having too much fat, sugar, calories, or salt (sodium) in your diet. Drinking too much alcohol. Other risk factors include: Having a personal history of heart disease, diabetes, high cholesterol, or kidney disease. Stress. Having a family history of high blood pressure and high cholesterol. Having obstructive sleep apnea. Age. The risk increases with age. What are the signs or symptoms? High blood pressure may not cause symptoms. Very high blood pressure (hypertensive crisis) may cause: Headache. Fast or irregular heartbeats (palpitations). Shortness of breath. Nosebleed. Nausea and vomiting. Vision changes. Severe chest pain, dizziness, and seizures. How is this diagnosed? This condition is diagnosed by  measuring your blood pressure while you are seated, with your arm resting on a flat surface, your legs uncrossed, and your feet flat on the floor. The cuff of the blood pressure monitor will be placed directly against the skin of your upper arm at the level of your heart. Blood pressure should be measured at least twice using the same arm. Certain conditions can cause a difference in blood pressure between your right and left arms. If you have a high blood pressure reading during one visit or you have normal blood pressure with other risk factors, you may be asked to: Return on a different day to have your blood pressure checked again. Monitor your blood pressure at home for 1 week or longer. If you are diagnosed with hypertension, you may have other blood or imaging tests to help your health care provider understand your overall risk for other conditions. How is this treated? This condition is treated by making healthy lifestyle changes, such as eating healthy foods, exercising more, and reducing your alcohol intake. You may be referred for counseling on a healthy diet and physical activity. Your health care provider may prescribe medicine if lifestyle changes are not enough to get your blood pressure under control and if: Your systolic blood pressure is above 130. Your diastolic blood pressure is above 80. Your personal target blood pressure may vary depending on your medical conditions, your age, and other factors. Follow these instructions at home: Eating and drinking  Eat a diet that is high in fiber and potassium, and low in sodium, added sugar, and fat. An example of this eating plan is called the DASH diet. DASH stands for Dietary Approaches to Stop Hypertension. To eat this way: Eat   plenty of fresh fruits and vegetables. Try to fill one half of your plate at each meal with fruits and vegetables. Eat whole grains, such as whole-wheat pasta, brown rice, or whole-grain bread. Fill about one  fourth of your plate with whole grains. Eat or drink low-fat dairy products, such as skim milk or low-fat yogurt. Avoid fatty cuts of meat, processed or cured meats, and poultry with skin. Fill about one fourth of your plate with lean proteins, such as fish, chicken without skin, beans, eggs, or tofu. Avoid pre-made and processed foods. These tend to be higher in sodium, added sugar, and fat. Reduce your daily sodium intake. Many people with hypertension should eat less than 1,500 mg of sodium a day. Do not drink alcohol if: Your health care provider tells you not to drink. You are pregnant, may be pregnant, or are planning to become pregnant. If you drink alcohol: Limit how much you have to: 0-1 drink a day for women. 0-2 drinks a day for men. Know how much alcohol is in your drink. In the [place] one drink equals one 12 oz bottle of beer (355 mL), one 5 oz glass of wine (148 mL), or one 1 oz glass of hard liquor (44 mL). Lifestyle  Work with your health care provider to maintain a healthy body weight or to lose weight. Ask what an ideal weight is for you. Get at least 30 minutes of exercise that causes your heart to beat faster (aerobic exercise) most days of the week. Activities may include walking, swimming, or biking. Include exercise to strengthen your muscles (resistance exercise), such as Pilates or lifting weights, as part of your weekly exercise routine. Try to do these types of exercises for 30 minutes at least 3 days a week. Do not use any products that contain nicotine or tobacco. These products include cigarettes, chewing tobacco, and vaping devices, such as e-cigarettes. If you need help quitting, ask your health care provider. Monitor your blood pressure at home as told by your health care provider. Keep all follow-up visits. This is important. Medicines Take over-the-counter and prescription medicines only as told by your health care provider. Follow directions carefully. Blood  pressure medicines must be taken as prescribed. Do not skip doses of blood pressure medicine. Doing this puts you at risk for problems and can make the medicine less effective. Ask your health care provider about side effects or reactions to medicines that you should watch for. Contact a health care provider if you: Think you are having a reaction to a medicine you are taking. Have headaches that keep coming back (recurring). Feel dizzy. Have swelling in your ankles. Have trouble with your vision. Get help right away if you: Develop a severe headache or confusion. Have unusual weakness or numbness. Feel faint. Have severe pain in your chest or abdomen. Vomit repeatedly. Have trouble breathing. These symptoms may be an emergency. Get help right away. Call 911. Do not wait to see if the symptoms will go away. Do not drive yourself to the hospital. Summary Hypertension is when the force of blood pumping through your arteries is too strong. If this condition is not controlled, it may put you at risk for serious complications. Your personal target blood pressure may vary depending on your medical conditions, your age, and other factors. For most people, a normal blood pressure is less than 120/80. Hypertension is treated with lifestyle changes, medicines, or a combination of both. Lifestyle changes include losing weight, eating a healthy,   low-sodium diet, exercising more, and limiting alcohol. This information is not intended to replace advice given to you by your health care provider. Make sure you discuss any questions you have with your health care provider. Document Revised: 08/28/2021 Document Reviewed: 08/28/2021 [employer] Patient Education  2023 [employer]  

## 2022-04-24 ENCOUNTER — Other Ambulatory Visit: Payer: Self-pay | Admitting: Internal Medicine

## 2022-04-24 DIAGNOSIS — J4530 Mild persistent asthma, uncomplicated: Secondary | ICD-10-CM

## 2022-04-25 ENCOUNTER — Other Ambulatory Visit: Payer: Self-pay | Admitting: Internal Medicine

## 2022-04-25 DIAGNOSIS — I10 Essential (primary) hypertension: Secondary | ICD-10-CM

## 2022-07-28 ENCOUNTER — Other Ambulatory Visit: Payer: Self-pay | Admitting: Internal Medicine

## 2022-07-28 DIAGNOSIS — J4530 Mild persistent asthma, uncomplicated: Secondary | ICD-10-CM

## 2022-07-29 ENCOUNTER — Other Ambulatory Visit: Payer: Self-pay | Admitting: Internal Medicine

## 2022-07-29 DIAGNOSIS — I10 Essential (primary) hypertension: Secondary | ICD-10-CM

## 2022-09-08 ENCOUNTER — Other Ambulatory Visit: Payer: Self-pay | Admitting: Internal Medicine

## 2022-09-08 DIAGNOSIS — I10 Essential (primary) hypertension: Secondary | ICD-10-CM

## 2022-09-19 ENCOUNTER — Encounter: Payer: Self-pay | Admitting: Internal Medicine

## 2022-09-20 ENCOUNTER — Other Ambulatory Visit: Payer: Self-pay | Admitting: Internal Medicine

## 2022-10-26 ENCOUNTER — Other Ambulatory Visit: Payer: Self-pay | Admitting: Internal Medicine

## 2022-10-26 DIAGNOSIS — L280 Lichen simplex chronicus: Secondary | ICD-10-CM

## 2023-05-27 ENCOUNTER — Other Ambulatory Visit: Payer: Self-pay | Admitting: Internal Medicine

## 2023-05-27 ENCOUNTER — Other Ambulatory Visit (INDEPENDENT_AMBULATORY_CARE_PROVIDER_SITE_OTHER): Payer: Self-pay | Admitting: Podiatry

## 2023-05-27 ENCOUNTER — Encounter: Payer: Self-pay | Admitting: Internal Medicine

## 2023-05-27 DIAGNOSIS — I10 Essential (primary) hypertension: Secondary | ICD-10-CM

## 2023-06-04 ENCOUNTER — Other Ambulatory Visit: Payer: Self-pay | Admitting: Internal Medicine

## 2023-06-04 ENCOUNTER — Encounter: Payer: Self-pay | Admitting: Internal Medicine

## 2023-06-04 ENCOUNTER — Ambulatory Visit (INDEPENDENT_AMBULATORY_CARE_PROVIDER_SITE_OTHER): Payer: BC Managed Care – PPO

## 2023-06-04 ENCOUNTER — Ambulatory Visit: Payer: BC Managed Care – PPO | Admitting: Internal Medicine

## 2023-06-04 VITALS — BP 144/76 | HR 78 | Temp 98.2°F | Resp 16 | Ht 67.0 in | Wt 198.0 lb

## 2023-06-04 DIAGNOSIS — R739 Hyperglycemia, unspecified: Secondary | ICD-10-CM

## 2023-06-04 DIAGNOSIS — I10 Essential (primary) hypertension: Secondary | ICD-10-CM

## 2023-06-04 DIAGNOSIS — L03119 Cellulitis of unspecified part of limb: Secondary | ICD-10-CM

## 2023-06-04 DIAGNOSIS — L97529 Non-pressure chronic ulcer of other part of left foot with unspecified severity: Secondary | ICD-10-CM

## 2023-06-04 DIAGNOSIS — Z0001 Encounter for general adult medical examination with abnormal findings: Secondary | ICD-10-CM

## 2023-06-04 DIAGNOSIS — L97519 Non-pressure chronic ulcer of other part of right foot with unspecified severity: Secondary | ICD-10-CM | POA: Diagnosis not present

## 2023-06-04 DIAGNOSIS — Z0000 Encounter for general adult medical examination without abnormal findings: Secondary | ICD-10-CM

## 2023-06-04 DIAGNOSIS — L02619 Cutaneous abscess of unspecified foot: Secondary | ICD-10-CM

## 2023-06-04 DIAGNOSIS — J4530 Mild persistent asthma, uncomplicated: Secondary | ICD-10-CM

## 2023-06-04 MED ORDER — SANTYL 250 UNIT/GM EX OINT
TOPICAL_OINTMENT | CUTANEOUS | 0 refills | Status: DC
Start: 2023-06-04 — End: 2023-06-04

## 2023-06-04 MED ORDER — TRAMADOL HCL 50 MG PO TABS: 50.0000 mg | ORAL_TABLET | Freq: Four times a day (QID) | ORAL | 1 refills | Status: AC | PRN

## 2023-06-04 MED ORDER — AIRSUPRA 90-80 MCG/ACT IN AERO
2.0000 | INHALATION_SPRAY | Freq: Four times a day (QID) | RESPIRATORY_TRACT | 1 refills | Status: AC | PRN
Start: 2023-06-04 — End: ?

## 2023-06-04 NOTE — Progress Notes (Signed)
Subjective:  Patient ID: Luke Gross, male    DOB: 1986-01-22  Age: 37 y.o. MRN: [id number]  CC: Annual Exam and Hypertension   HPI Luke Gross presents for a CPX and f/up ---  Discussed the use of AI scribe software for clinical note transcription with the patient, who gave verbal consent to proceed.  History of Present Illness   The patient, with a known history of asthma and hypertension, reports overall good health with no recent asthmatic issues. They deny experiencing chest pain, shortness of breath, or wheezing. They recently refilled their Wixela inhaler after a period without it.  Regarding their hypertension, the patient is unsure about their medication regimen. They occasionally take Chlorthalidone, which they initially misidentified as a potassium supplement, but were not aware it was for blood pressure control. They were under the impression they were taking Lisinopril, which is not part of their current regimen.  The patient also intermittently takes Singulair, but denies taking Carvedilol. They mention a need for a pain medication refill, but do not elaborate on the nature or location of the pain.  Lastly, they express concern about a new spot on their foot, but do not provide further details about its characteristics or associated symptoms. For two weeks there has been a painful ulcer.       Outpatient Medications Prior to Visit  Medication Sig Dispense Refill   ADVAIR DISKUS 500-50 MCG/ACT AEPB INHALE 1 PUFF INTO THE LUNGS IN THE MORNING AND AT BEDTIME. 180 each 1   albuterol (PROVENTIL) (2.5 MG/3ML) 0.083% nebulizer solution USE 1 VIAL VIA NEBULIZER EVERY 4-6 HOURS AS NEEDED FOR COUGH OR WHEEZE 75 mL 5   Cholecalciferol 50 MCG (2000 UT) TABS Take 2 tablets (4,000 Units total) by mouth daily. 180 tablet 1   clobetasol ointment (TEMOVATE) 0.05 % APPLY TO AFFECTED AREA TWICE A DAY 45 g 1   KLOR-CON M20 20 MEQ tablet TAKE 1 TABLET BY MOUTH TWICE A DAY  180 tablet 1   montelukast (SINGULAIR) 10 MG tablet TAKE 1 TABLET BY MOUTH EVERYDAY AT BEDTIME 90 tablet 1   albuterol (VENTOLIN HFA) 108 (90 Base) MCG/ACT inhaler INHALE 1 TO 2 PUFFS INTO THE LUNGS EVERY 6 HOURS AS NEEDED FOR WHEEZING OR SHORTNES OF BREATH 8.5 each 2   carvedilol (COREG) 3.125 MG tablet TAKE 1 TABLET BY MOUTH 2 TIMES DAILY WITH A MEAL. 180 tablet 1   chlorthalidone (HYGROTON) 25 MG tablet TAKE 1 TABLET BY MOUTH EVERY DAY 90 tablet 0   collagenase (SANTYL) ointment APPLY NICKEL THICKNESS TO WOUND AREA FOLLOWED BE WET-TO-DRY DRESSING DAILY 30 g 0   traMADol (ULTRAM) 50 MG tablet Take 1 tablet (50 mg total) by mouth every 6 (six) hours as needed. 75 tablet 1   No facility-administered medications prior to visit.    ROS Review of Systems  Constitutional:  Negative for appetite change, chills, diaphoresis, fatigue and fever.  HENT: Negative.    Eyes: Negative.   Respiratory:  Negative for cough, chest tightness, shortness of breath and wheezing.   Cardiovascular:  Negative for chest pain, palpitations and leg swelling.  Gastrointestinal: Negative.  Negative for abdominal pain, constipation, diarrhea, nausea and vomiting.  Endocrine: Negative.   Genitourinary: Negative.  Negative for difficulty urinating.  Musculoskeletal:  Negative for arthralgias, joint swelling and myalgias.  Skin:  Positive for wound.  Neurological: Negative.  Negative for dizziness, weakness, light-headedness and headaches.  Hematological:  Negative for adenopathy. Does not bruise/bleed easily.  Psychiatric/Behavioral: Negative.  Objective:  BP (!) 144/76 (BP Location: Left Arm, Patient Position: Sitting, Cuff Size: Large)   Pulse 78   Temp 98.2 F (36.8 C) (Oral)   Resp 16   Ht 5\' 7"  (1.702 m)   Wt 198 lb (89.8 kg)   SpO2 97%   BMI 31.01 kg/m     BP Readings from Last 3 Encounters:  06/04/23 (!) 144/76  02/19/22 128/82  06/06/21 (!) 142/90    Wt Readings from Last 3 Encounters:   06/04/23 198 lb (89.8 kg)  02/19/22 237 lb (107.5 kg)  06/06/21 236 lb (107 kg)    Physical Exam Vitals reviewed.  Constitutional:      Appearance: Normal appearance. He is not ill-appearing.  HENT:     Mouth/Throat:     Mouth: Mucous membranes are moist.  Eyes:     General: No scleral icterus.    Conjunctiva/sclera: Conjunctivae normal.  Cardiovascular:     Rate and Rhythm: Normal rate and regular rhythm.     Pulses:          Dorsalis pedis pulses are 1+ on the right side and 1+ on the left side.       Posterior tibial pulses are 1+ on the right side and 1+ on the left side.     Heart sounds: No murmur heard.    No friction rub. No gallop.     Comments: EKG- NSR, 69 bpm Septal infarct pattern - normal variant Mild LVH No acute ST/T waves No old tracing to compare Pulmonary:     Effort: Pulmonary effort is normal.     Breath sounds: No stridor. No wheezing, rhonchi or rales.  Abdominal:     General: Abdomen is flat.     Palpations: There is no mass.     Tenderness: There is no abdominal tenderness. There is no guarding.     Hernia: No hernia is present.  Musculoskeletal:     Cervical back: Neck supple.     Right lower leg: No edema.     Left lower leg: Edema present.  Feet:     Right foot:     Skin integrity: Skin integrity normal.     Left foot:     Skin integrity: Ulcer present.  Lymphadenopathy:     Cervical: No cervical adenopathy.  Skin:    General: Skin is warm and dry.  Neurological:     General: No focal deficit present.     Mental Status: He is alert. Mental status is at baseline.  Psychiatric:        Mood and Affect: Mood normal.        Behavior: Behavior normal.     Lab Results  Component Value Date   WBC 5.5 02/19/2022   HGB 13.8 02/19/2022   HCT 42.0 02/19/2022   PLT 222.0 02/19/2022   GLUCOSE 95 02/19/2022   CHOL 152 06/06/2021   TRIG 59.0 06/06/2021   HDL 48.30 06/06/2021   LDLCALC 92 06/06/2021   ALT 19 01/19/2020   AST 21  01/19/2020   NA 137 02/19/2022   K 3.7 02/19/2022   CL 101 02/19/2022   CREATININE 0.85 02/19/2022   BUN 24 (H) 02/19/2022   CO2 28 02/19/2022   TSH 0.66 06/06/2021   HGBA1C 6.2 02/19/2022    VAS [place] LOWER EXTREMITY VENOUS REFLUX  Result Date: 06/22/2021  Lower Venous Reflux Study Patient Name:  Luke Gross  Date of Exam:   06/22/2021 Medical Rec #: [id number]  Accession #:    [id number] Date of Birth: 1985/12/09            Patient Gender: M Patient Age:   89 years Exam Location:  [facility] Procedure:      VAS [place] LOWER EXTREMITY VENOUS REFLUX Referring Phys: MICHAEL PRICE --------------------------------------------------------------------------------  Indications: Ulceration.  Comparison Study: None Performing Technologist: Ethelle Lyon  Examination Guidelines: A complete evaluation includes B-mode imaging, spectral Doppler, color Doppler, and power Doppler as needed of all accessible portions of each vessel. Bilateral testing is considered an integral part of a complete examination. Limited examinations for reoccurring indications may be performed as noted. The reflux portion of the exam is performed with the patient in reverse Trendelenburg. Significant venous reflux is defined as >500 ms in the superficial venous system, and >1 second in the deep venous system.  +--------------+---------+------+-----------+------------+--------+ LEFT          Reflux NoRefluxReflux TimeDiameter cmsComments                         Yes                                  +--------------+---------+------+-----------+------------+--------+ CFV           no                                             +--------------+---------+------+-----------+------------+--------+ FV mid        no                                             +--------------+---------+------+-----------+------------+--------+ Popliteal     no                                              +--------------+---------+------+-----------+------------+--------+ GSV at [facility]    no                            0.47             +--------------+---------+------+-----------+------------+--------+ GSV prox thigh          yes    >500 ms      0.38             +--------------+---------+------+-----------+------------+--------+ GSV mid thigh           yes    >500 ms      0.42             +--------------+---------+------+-----------+------------+--------+ GSV dist thigh          yes    >500 ms      0.41             +--------------+---------+------+-----------+------------+--------+ GSV at knee             yes    >500 ms      0.38             +--------------+---------+------+-----------+------------+--------+ GSV prox calf           yes    >  500 ms      0.35             +--------------+---------+------+-----------+------------+--------+ SSV Pop Fossa no                            0.15             +--------------+---------+------+-----------+------------+--------+ SSV prox calf no                            0.18             +--------------+---------+------+-----------+------------+--------+ SSV mid calf  no                            0.32             +--------------+---------+------+-----------+------------+--------+   Summary: Left: - No evidence of deep vein thrombosis seen in the left lower extremity, from the common femoral through the popliteal veins. - No evidence of superficial venous thrombosis in the left lower extremity.  - Venous reflux is noted in the left greater saphenous vein in the thigh. - Venous reflux is noted in the left greater saphenous vein in the calf.  *See table(s) above for measurements and observations. Electronically signed by Lemar Livings MD on 06/22/2021 at 4:37:20 PM.    Final    No results found.   Assessment & Plan:   Essential hypertension, benign- He has not achieved his blood pressure goal.  Will restart  chlorthalidone. -     EKG 12-Lead -     Basic metabolic panel; Future -     CBC with Differential/Platelet; Future -     Hepatic function panel; Future -     TSH; Future -     Chlorthalidone; Take 1 tablet (25 mg total) by mouth daily.  Dispense: 90 tablet; Refill: 0  Non-healing ulcer of left foot, unspecified ulcer stage (HCC) -     DG Foot Complete Left; Future -     C-reactive protein; Future -     traMADol HCl; Take 1 tablet (50 mg total) by mouth every 6 (six) hours as needed.  Dispense: 75 tablet; Refill: 1 -     WOUND CULTURE; Future -     Ambulatory referral to Wound Clinic  Chronic hyperglycemia -     Basic metabolic panel; Future -     Hepatic function panel; Future -     Hemoglobin A1c; Future  Routine general medical examination at a health care facility- Exam completed, labs reviewed, vaccines reviewed and updated, cancer screenings addressed, pt ed material was given.  -     Lipid panel; Future  Mild persistent asthma without complication -     Airsupra; Inhale 2 puffs into the lungs 4 (four) times daily as needed.  Dispense: 32.1 g; Refill: 1  Skin ulcers of foot, bilateral (HCC) -     traMADol HCl; Take 1 tablet (50 mg total) by mouth every 6 (six) hours as needed.  Dispense: 75 tablet; Refill: 1  Cellulitis and abscess of foot- Culture is positive for Pseudomonas.  Sensitive to fluoroquinolones. -     Ciprofloxacin HCl; Take 1 tablet (500 mg total) by mouth 2 (two) times daily for 10 days.  Dispense: 20 tablet; Refill: 0     Follow-up: Return in about 3 months (around 09/04/2023).  Sanda Linger, MD

## 2023-06-04 NOTE — Patient Instructions (Signed)
Health Maintenance, Male Adopting a healthy lifestyle and getting preventive care are important in promoting health and wellness. Ask your health care provider about: The right schedule for you to have regular tests and exams. Things you can do on your own to prevent diseases and keep yourself healthy. What should I know about diet, weight, and exercise? Eat a healthy diet  Eat a diet that includes plenty of vegetables, fruits, low-fat dairy products, and lean protein. Do not eat a lot of foods that are high in solid fats, added sugars, or sodium. Maintain a healthy weight Body mass index (BMI) is a measurement that can be used to identify possible weight problems. It estimates body fat based on height and weight. Your health care provider can help determine your BMI and help you achieve or maintain a healthy weight. Get regular exercise Get regular exercise. This is one of the most important things you can do for your health. Most adults should: Exercise for at least 150 minutes each week. The exercise should increase your heart rate and make you sweat (moderate-intensity exercise). Do strengthening exercises at least twice a week. This is in addition to the moderate-intensity exercise. Spend less time sitting. Even light physical activity can be beneficial. Watch cholesterol and blood lipids Have your blood tested for lipids and cholesterol at 37 years of age, then have this test every 5 years. You may need to have your cholesterol levels checked more often if: Your lipid or cholesterol levels are high. You are older than 37 years of age. You are at high risk for heart disease. What should I know about cancer screening? Many types of cancers can be detected early and may often be prevented. Depending on your health history and family history, you may need to have cancer screening at various ages. This may include screening for: Colorectal cancer. Prostate cancer. Skin cancer. Lung  cancer. What should I know about heart disease, diabetes, and high blood pressure? Blood pressure and heart disease High blood pressure causes heart disease and increases the risk of stroke. This is more likely to develop in people who have high blood pressure readings or are overweight. Talk with your health care provider about your target blood pressure readings. Have your blood pressure checked: Every 3-5 years if you are 18-39 years of age. Every year if you are 40 years old or older. If you are between the ages of 65 and 75 and are a current or former smoker, ask your health care provider if you should have a one-time screening for abdominal aortic aneurysm (AAA). Diabetes Have regular diabetes screenings. This checks your fasting blood sugar level. Have the screening done: Once every three years after age 45 if you are at a normal weight and have a low risk for diabetes. More often and at a younger age if you are overweight or have a high risk for diabetes. What should I know about preventing infection? Hepatitis B If you have a higher risk for hepatitis B, you should be screened for this virus. Talk with your health care provider to find out if you are at risk for hepatitis B infection. Hepatitis C Blood testing is recommended for: Everyone born from 1945 through 1965. Anyone with known risk factors for hepatitis C. Sexually transmitted infections (STIs) You should be screened each year for STIs, including gonorrhea and chlamydia, if: You are sexually active and are younger than 37 years of age. You are older than 37 years of age and your   health care provider tells you that you are at risk for this type of infection. Your sexual activity has changed since you were last screened, and you are at increased risk for chlamydia or gonorrhea. Ask your health care provider if you are at risk. Ask your health care provider about whether you are at high risk for HIV. Your health care provider  may recommend a prescription medicine to help prevent HIV infection. If you choose to take medicine to prevent HIV, you should first get tested for HIV. You should then be tested every 3 months for as long as you are taking the medicine. Follow these instructions at home: Alcohol use Do not drink alcohol if your health care provider tells you not to drink. If you drink alcohol: Limit how much you have to 0-2 drinks a day. Know how much alcohol is in your drink. In the [place] one drink equals one 12 oz bottle of beer (355 mL), one 5 oz glass of wine (148 mL), or one 1 oz glass of hard liquor (44 mL). Lifestyle Do not use any products that contain nicotine or tobacco. These products include cigarettes, chewing tobacco, and vaping devices, such as e-cigarettes. If you need help quitting, ask your health care provider. Do not use street drugs. Do not share needles. Ask your health care provider for help if you need support or information about quitting drugs. General instructions Schedule regular health, dental, and eye exams. Stay current with your vaccines. Tell your health care provider if: You often feel depressed. You have ever been abused or do not feel safe at home. Summary Adopting a healthy lifestyle and getting preventive care are important in promoting health and wellness. Follow your health care provider's instructions about healthy diet, exercising, and getting tested or screened for diseases. Follow your health care provider's instructions on monitoring your cholesterol and blood pressure. This information is not intended to replace advice given to you by your health care provider. Make sure you discuss any questions you have with your health care provider. Document Revised: 03/12/2021 Document Reviewed: 03/12/2021 Elsevier Patient Education  2024 [employer]  

## 2023-06-05 MED ORDER — SANTYL 250 UNIT/GM EX OINT
TOPICAL_OINTMENT | CUTANEOUS | 0 refills | Status: AC
Start: 2023-06-05 — End: ?

## 2023-06-07 DIAGNOSIS — L02619 Cutaneous abscess of unspecified foot: Secondary | ICD-10-CM | POA: Insufficient documentation

## 2023-06-07 MED ORDER — CIPROFLOXACIN HCL 500 MG PO TABS: 500.0000 mg | ORAL_TABLET | Freq: Two times a day (BID) | ORAL | 0 refills | Status: AC

## 2023-06-07 MED ORDER — CHLORTHALIDONE 25 MG PO TABS: 25.0000 mg | ORAL_TABLET | Freq: Every day | ORAL | 0 refills | Status: AC

## 2023-06-09 ENCOUNTER — Ambulatory Visit: Payer: BC Managed Care – PPO | Admitting: Podiatry

## 2023-06-09 ENCOUNTER — Encounter: Payer: Self-pay | Admitting: Podiatry

## 2023-06-09 VITALS — BP 157/79 | HR 74 | Ht 67.0 in | Wt 198.0 lb

## 2023-06-09 DIAGNOSIS — I70235 Atherosclerosis of native arteries of right leg with ulceration of other part of foot: Secondary | ICD-10-CM

## 2023-06-09 NOTE — Progress Notes (Signed)
  Subjective:  Patient ID: Luke Gross, male    DOB: 04/05/1986,   MRN: [id number]  Chief Complaint  Patient presents with   Foot Ulcer    Ulceration left foot top of left foot swollen yellow drainage needs refill on Santyl ointment and can't get the Tramadol either (on hold at pharmacy)    37 y.o. male presents for concern of ulceration on the top of his left foot that has been present for a few weeks. Has a history of these wounds and been treated by Dr. Samuella Cota int he past. Sounds like sickle cell was ruled out. Has been applying santyl to the wound . Denies any other pedal complaints. Denies n/v/f/c.   Past Medical History:  Diagnosis Date   Allergy    Asthma    Hypertension     Objective:  Physical Exam: Vascular: DP/PT pulses 2/4 bilateral. CFT <3 seconds. Normal hair growth on digits. No edema.  Skin. No lacerations or abrasions bilateral feet. Left anterior foot wound over sulcus of first interspace with rolled edged and fibrinous base about 0.5cm x 1 cm x 0.4 cm. No erythema edema or purulence noted. No probe to bone Musculoskeletal: MMT 5/5 bilateral lower extremities in DF, PF, Inversion and Eversion. Deceased ROM in DF of ankle joint.  Neurological: Sensation intact to light touch.   Assessment:   1. 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 Ulcer left foot with fat layer exposed  -Debridement as below. -Dressed with betadine, DSD. Continue santly was refilled by PCP.  -Off-loading with surgical shoe. Dispensed  -No abx indicated.  -Discussed glucose control and proper protein-rich diet.  -Discussed if any worsening redness, pain, fever or chills to call or may need to report to the emergency room. Patient expressed understanding.   Procedure: Excisional Debridement of Wound Rationale: Removal of non-viable soft tissue from the wound to promote healing.   Anesthesia: none Pre-Debridement Wound Measurements: Fibrinous slought overlying  Post-Debridement Wound Measurements: 0.5 cm x 1 cm x 0.4 cm  Type of Debridement: Sharp Excisional Tissue Removed: Non-viable soft tissue Depth of Debridement: subcutaneous tissue. Technique: Sharp excisional debridement to bleeding, viable wound base.  Dressing: Dry, sterile, compression dressing. Disposition: Patient tolerated procedure well. Patient to return in 2 week for follow-up.  Return in about 2 weeks (around 06/23/2023) for wound check.   Louann Sjogren, DPM

## 2023-06-10 NOTE — Addendum Note (Signed)
Addended by: Etta Grandchild on: 06/10/2023 09:13 AM   Modules accepted: Orders

## 2023-06-24 ENCOUNTER — Ambulatory Visit: Payer: BC Managed Care – PPO | Admitting: Podiatry

## 2023-06-24 ENCOUNTER — Encounter: Payer: Self-pay | Admitting: Podiatry

## 2023-06-24 DIAGNOSIS — I70245 Atherosclerosis of native arteries of left leg with ulceration of other part of foot: Secondary | ICD-10-CM

## 2023-06-24 MED ORDER — SANTYL 250 UNIT/GM EX OINT: 1.0000 | TOPICAL_OINTMENT | Freq: Every day | CUTANEOUS | 0 refills | Status: AC

## 2023-06-24 NOTE — Progress Notes (Signed)
  Subjective:  Patient ID: Luke Gross, male    DOB: 08-24-1986,   MRN: [id number]  Chief Complaint  Patient presents with   Wound Check    Left top of foot wound f/u, still a little drainage, no bleeding    37 y.o. male presents for follow-up of left foot ulceration.  Has a history of these wounds and been treated by Dr. Samuella Cota int he past. Sounds like sickle cell was ruled out. Has been applying neosporin to the wound as he was unable to get santyl refilled. . Denies any other pedal complaints. Denies n/v/f/c.   Past Medical History:  Diagnosis Date   Allergy    Asthma    Hypertension     Objective:  Physical Exam: Vascular: DP/PT pulses 2/4 bilateral. CFT <3 seconds. Normal hair growth on digits. No edema.  Skin. No lacerations or abrasions bilateral feet. Left anterior foot wound over sulcus of first interspace with rolled edged and fibrinous base about 0.7cm x 1 cm x 0.4 cm. No erythema edema or purulence noted. No probe to bone Musculoskeletal: MMT 5/5 bilateral lower extremities in DF, PF, Inversion and Eversion. Deceased ROM in DF of ankle joint.  Neurological: Sensation intact to light touch.   Assessment:   1. 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 Ulcer left foot with fat layer exposed  -Debridement as below. -Dressed with betadine, DSD. Unable to get santyl yet did send another prescirption for him to start this.  -Off-loading with surgical shoe. Dispensed  -No abx indicated.  -Discussed glucose control and proper protein-rich diet.  -Discussed if any worsening redness, pain, fever or chills to call or may need to report to the emergency room. Patient expressed understanding.   Procedure: Excisional Debridement of Wound Rationale: Removal of non-viable soft tissue from the wound to promote healing.  Anesthesia: none Pre-Debridement Wound Measurements:  Fibrinous slought overlying  Post-Debridement Wound Measurements: 0.5 cm x 1 cm x 0.4 cm  Type of Debridement: Sharp Excisional Tissue Removed: Non-viable soft tissue Depth of Debridement: subcutaneous tissue. Technique: Sharp excisional debridement to bleeding, viable wound base.  Dressing: Dry, sterile, compression dressing. Disposition: Patient tolerated procedure well. Patient to return in 2 week for follow-up.  Return in about 2 weeks (around 07/08/2023) for wound check.   Louann Sjogren, DPM

## 2023-07-08 ENCOUNTER — Ambulatory Visit: Payer: BC Managed Care – PPO | Admitting: Podiatry

## 2023-07-08 DIAGNOSIS — I70245 Atherosclerosis of native arteries of left leg with ulceration of other part of foot: Secondary | ICD-10-CM

## 2023-07-08 NOTE — Progress Notes (Signed)
  Subjective:  Patient ID: Luke Gross, male    DOB: 09-05-1986,   MRN: [id number]  Chief Complaint  Patient presents with   Wound Check    Left top of foot F/ u,little drainage, little odor noticed by patient    37 y.o. male presents for follow-up of left foot ulceration.  Has a history of these wounds and been treated by Dr. Samuella Cota int he past. Sounds like sickle cell was ruled out. Has been applying santyl to wound now.  Denies any other pedal complaints. Denies n/v/f/c.   Past Medical History:  Diagnosis Date   Allergy    Asthma    Hypertension     Objective:  Physical Exam: Vascular: DP/PT pulses 2/4 bilateral. CFT <3 seconds. Normal hair growth on digits. No edema.  Skin. No lacerations or abrasions bilateral feet. Left anterior foot wound over sulcus of first interspace with rolled edged and fibrinous base there is some more noticible graunlar tissue as well and  about 0.7cm x 1.4 cm x 0.4 cm. No erythema edema or purulence noted. No probe to bone Musculoskeletal: MMT 5/5 bilateral lower extremities in DF, PF, Inversion and Eversion. Deceased ROM in DF of ankle joint.  Neurological: Sensation intact to light touch.   Assessment:   1. 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 Ulcer left foot with fat layer exposed  -Debridement as below. -Dressed with betadine, DSD. Continue with santyl.  -Off-loading with surgical shoe. Dispensed  -No abx indicated.  -Discussed glucose control and proper protein-rich diet.  -Discussed if any worsening redness, pain, fever or chills to call or may need to report to the emergency room. Patient expressed understanding.  -Discussed if no major improvement next visit will refer to wound care as grafts have worked in the past and currently unable to apply grafts in our office.   Procedure: Excisional Debridement of  Wound Rationale: Removal of non-viable soft tissue from the wound to promote healing.  Anesthesia: none Pre-Debridement Wound Measurements: Fibrinous slought overlying  Post-Debridement Wound Measurements: 0.5 cm x 1.4 cm x 0.4 cm  Type of Debridement: Sharp Excisional Tissue Removed: Non-viable soft tissue Depth of Debridement: subcutaneous tissue. Technique: Sharp excisional debridement to bleeding, viable wound base.  Dressing: Dry, sterile, compression dressing. Disposition: Patient tolerated procedure well. Patient to return in 2 week for follow-up.  Return in about 2 weeks (around 07/22/2023) for wound check.   Louann Sjogren, DPM

## 2023-07-29 ENCOUNTER — Ambulatory Visit: Payer: BC Managed Care – PPO | Admitting: Podiatry

## 2023-07-29 ENCOUNTER — Encounter: Payer: Self-pay | Admitting: Podiatry

## 2023-07-29 DIAGNOSIS — I70245 Atherosclerosis of native arteries of left leg with ulceration of other part of foot: Secondary | ICD-10-CM

## 2023-07-29 NOTE — Progress Notes (Signed)
Subjective:  Patient ID: Luke Gross, male    DOB: Dec 05, 1985,   MRN: [id number]  Chief Complaint  Patient presents with   Wound Check     Left top of foot F/ u,no drainage.       37 y.o. male presents for follow-up of left foot ulceration.  Has a history of these wounds and been treated by Dr. Samuella Cota int he past. Sounds like sickle cell was ruled out. Has been applying santyl to wound now.  Denies any other pedal complaints. Denies n/v/f/c.   Past Medical History:  Diagnosis Date   Allergy    Asthma    Hypertension     Objective:  Physical Exam: Vascular: DP/PT pulses 2/4 bilateral. CFT <3 seconds. Normal hair growth on digits. No edema.  Skin. No lacerations or abrasions bilateral feet. Left anterior foot wound over sulcus of first interspace with rolled edged and fibrinous base there is some more noticible graunlar tissue as well and  about 0.7cm x 1.4 cm x 0.4 cm. No erythema edema or purulence noted. No probe to bone Musculoskeletal: MMT 5/5 bilateral lower extremities in DF, PF, Inversion and Eversion. Deceased ROM in DF of ankle joint.  Neurological: Sensation intact to light touch.   Assessment:   1. 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 Ulcer left foot with fat layer exposed  -Debridement as below. -Dressed with betadine, DSD. Continue with santyl.  -Off-loading with surgical shoe. Dispensed  -No abx indicated.  -Discussed glucose control and proper protein-rich diet.  -Discussed if any worsening redness, pain, fever or chills to call or may need to report to the emergency room. Patient expressed understanding.  -Will refer to wound care.  -Did discuss considered a biopsy at next visit to determine possible cause for these reoccurring wounds. Patient will consider.   Procedure: Excisional Debridement of Wound Rationale: Removal of non-viable  soft tissue from the wound to promote healing.  Anesthesia: none Pre-Debridement Wound Measurements: Fibrinous slought overlying  Post-Debridement Wound Measurements: 0.7 cm x 1.4 cm x 0.4 cm  Type of Debridement: Sharp Excisional Tissue Removed: Non-viable soft tissue Depth of Debridement: subcutaneous tissue. Technique: Sharp excisional debridement to bleeding, viable wound base.  Dressing: Dry, sterile, compression dressing. Disposition: Patient tolerated procedure well. Patient to return in 2 week for follow-up.  Return in about 2 weeks (around 08/12/2023) for wound check.   Louann Sjogren, DPM

## 2023-07-31 ENCOUNTER — Encounter (HOSPITAL_BASED_OUTPATIENT_CLINIC_OR_DEPARTMENT_OTHER): Payer: BC Managed Care – PPO | Attending: General Surgery | Admitting: General Surgery

## 2023-07-31 DIAGNOSIS — I10 Essential (primary) hypertension: Secondary | ICD-10-CM | POA: Diagnosis not present

## 2023-07-31 DIAGNOSIS — I872 Venous insufficiency (chronic) (peripheral): Secondary | ICD-10-CM | POA: Diagnosis not present

## 2023-07-31 DIAGNOSIS — J45909 Unspecified asthma, uncomplicated: Secondary | ICD-10-CM | POA: Diagnosis not present

## 2023-07-31 DIAGNOSIS — L97525 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 without evidence of necrosis: Secondary | ICD-10-CM | POA: Diagnosis present

## 2023-07-31 NOTE — Progress Notes (Addendum)
Gross, Luke ([id number]) [id number] Page 1 of 9 Visit Report for 07/31/2023 Chief Complaint Document Details Patient Name: Date of Service: [name] L. 07/31/2023 9:00 A M Medical Record Number: [id number] Patient Account Number: [id number] Date of Birth/Sex: Treating RN: [birth year] (37 y.o. M) Primary Care Provider: Sanda Linger Other Clinician: Referring Provider: Treating Provider/Extender: Perley Jain in Treatment: 0 Information Obtained from: Patient Chief Complaint Patient seen for complaints of Non-Healing Wound on foot, in setting of venous insufficiency Electronic Signature(s) Signed: 07/31/2023 10:20:46 AM By: [name] MD FACS Previous Signature: 07/31/2023 9:29:41 AM Version By: [name] MD FACS Entered By: [name] on 07/31/2023 10:20:46 -------------------------------------------------------------------------------- Debridement Details Patient Name: Date of Service: [name] 07/31/2023 9:00 A M Medical Record Number: [id number] Patient Account Number: [id number] Date of Birth/Sex: Treating RN: 11/17/1985 (37 y.o. Luke Gross Primary Care Provider: Sanda Linger Other Clinician: Referring Provider: Treating Provider/Extender: Perley Jain in Treatment: 0 Debridement Performed for Assessment: Wound #1 Left,Distal,Dorsal Foot Performed By: Physician [name], MD The following information was scribed by: Zenaida Deed The information was scribed for: [name] Debridement Type: Debridement Level of Consciousness (Pre-procedure): Awake and Alert Pre-procedure Verification/Time Out Yes - 10:05 Taken: Start Time: 10:08 Pain Control: Lidocaine 4% T opical Solution Percent of Wound Bed Debrided: 100% T Area Debrided (cm): otal 0.78 Tissue and other material debrided: Non-Viable, Slough, Fibrin/Exudate,  Slough Level: Non-Viable Tissue Debridement Description: Selective/Open Wound Instrument: Curette Bleeding: Minimum Hemostasis Achieved: Pressure Procedural Pain: 5 Post Procedural Pain: 3 Response to Treatment: Procedure was tolerated well Level of Consciousness (Post- Awake and Alert procedure): Post Debridement Measurements of Total Wound Length: (cm) 1 Width: (cm) 1 Depth: (cm) 0.5 Volume: (cm) 0.393 Causey, Tylen L ([id number]) [id number] Page 2 of 9 Character of Wound/Ulcer Post Debridement: Improved Post Procedure Diagnosis Same as Pre-procedure Electronic Signature(s) Signed: 07/31/2023 10:32:36 AM By: [name] MD FACS Signed: 07/31/2023 4:08:12 PM By: Zenaida Deed RN, BSN Entered By: Zenaida Deed on 07/31/2023 10:11:24 -------------------------------------------------------------------------------- HPI Details Patient Name: Date of Service: [name] 07/31/2023 9:00 A M Medical Record Number: [id number] Patient Account Number: [id number] Date of Birth/Sex: Treating RN: 1986-09-14 (37 y.o. M) Primary Care Provider: Sanda Linger Other Clinician: Referring Provider: Treating Provider/Extender: Perley Jain in Treatment: 0 History of Present Illness HPI Description: ADMISSION 07/31/2023 This is a 37 year old man who has a history of recurrent ulcerations on his feet. He has been tested for sickle cell disease and does not have it. This has been a recurrent issue for several years now. He has primarily been followed by podiatry. They obtained vascular studies in 2022. ABI results are copied here: +-------+-----------+-----------+------------+------------+ ABI/TBIT oday's ABIT oday's TBIPrevious ABIPrevious TBI +-------+-----------+-----------+------------+------------+ Right 1.22 0.83    +-------+-----------+-----------+------------+------------+ Left 1.26 0.74     +-------+-----------+-----------+------------+------------+ Venous reflux testing demonstrated reflux in the greater saphenous vein in the left proximal thigh, mid thigh, distal thigh, knee, and proximal calf. I do not see that he was ever referred to vascular surgery to address his venous insufficiency. He last saw podiatry 2 days ago. Apparently they dressed it with Betadine but then recommended that he apply Santyl, which he has been doing. Electronic Signature(s) Signed: 07/31/2023 10:20:55 AM By: [name] MD FACS Previous Signature: 07/31/2023 9:35:45 AM Version By: [name] MD FACS Entered By: [name] on 07/31/2023 10:20:55 -------------------------------------------------------------------------------- Physical Exam Details Patient Name: Date of Service: MA [name], [name] PHER L.  07/31/2023 9:00 A M Medical Record Number: [id number] Patient Account Number: [id number] Date of Birth/Sex: Treating RN: 1986-01-27 (37 y.o. M) Primary Care Provider: Sanda Linger Other Clinician: Referring Provider: Treating Provider/Extender: Perley Jain in Treatment: 0 Constitutional Slightly hypertensive. Bradycardic, asymptomatic. . . No acute distress. Respiratory Normal work of breathing on room air. Cardiovascular . Gross, Luke ([id number]) [id number] Page 3 of 9 Notes 07/31/2023: On his dorsal left foot at the base of the second toe, there is an oval-shaped wound full of slough. It does extend down to the muscle layer. There is a small bud of granulation tissue. There is undermining present. Electronic Signature(s) Signed: 07/31/2023 10:22:10 AM By: [name] MD FACS Entered By: [name] on 07/31/2023 10:22:10 -------------------------------------------------------------------------------- Physician Orders Details Patient Name: Date of Service: [name] 07/31/2023 9:00 A  M Medical Record Number: [id number] Patient Account Number: [id number] Date of Birth/Sex: Treating RN: 10-Jun-1986 (37 y.o. 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 - Dr. Lady Gary RM 1 Return A Anesthetic Wound #1 Left,Distal,Dorsal Foot (In clinic) Topical Lidocaine 4% applied to wound bed Bathing/ Shower/ Hygiene May shower and wash wound with soap and water. Edema Control - Lymphedema / SCD / Other Elevate legs to the level of the heart or above for 30 minutes daily and/or when sitting for 3-4 times a day throughout the day. - throughout the day Avoid standing for long periods of time. Exercise regularly Moisturize legs daily. Compression stocking or Garment 20-30 mm/Hg pressure to: - both legs daily Wound Treatment Wound #1 - Foot Wound Laterality: Dorsal, Left, Distal Cleanser: Wound Cleanser 1 x Per Day/30 Days Discharge Instructions: Cleanse the wound with wound cleanser prior to applying a clean dressing using gauze sponges, not tissue or cotton balls. Cleanser: Byram Ancillary Kit - 15 Day Supply (DME) (Generic) 1 x Per Day/30 Days Discharge Instructions: Use supplies as instructed; Kit contains: (15) Saline Bullets; (15) 3x3 Gauze; 15 pr Gloves Prim Dressing: Santyl Ointment 1 x Per Day/30 Days ary Discharge Instructions: Apply nickel thick amount to wound bed as instructed Secondary Dressing: Woven Gauze Sponge, Non-Sterile 4x4 in (DME) (Generic) 1 x  Per Day/30 Days Discharge Instructions: Apply over primary dressing as directed. Secured With: Insurance underwriter, Sterile 2x75 (in/in) (DME) (Generic) 1 x Per Day/30 Days Discharge Instructions: Secure with stretch gauze as directed. Secured With: Paper Tape, 2x10 (in/yd) (DME) (Generic) 1 x Per Day/30 Days Discharge Instructions: Secure dressing with tape as directed. DARBY, SHADWICK ([id number]) [id number] Page 4 of 9 Patient Medications llergies: lisinopril A Notifications Medication Indication Start End prior to debridement 07/31/2023 lidocaine DOSE topical 4 % cream - cream topical Electronic Signature(s) Signed: 07/31/2023 12:58:46 PM By: [name] MD FACS Signed: 07/31/2023 4:08:12 PM By: Zenaida Deed RN, BSN Previous Signature: 07/31/2023 10:32:36 AM Version By: [name] MD FACS Entered By: Zenaida Deed on 07/31/2023 10:34:54 -------------------------------------------------------------------------------- Problem List Details Patient Name: Date of Service: [name] 07/31/2023 9:00 A M Medical Record Number: [id number] Patient Account Number: [id number] Date of Birth/Sex: Treating RN: July 06, 1986 (36  y.o. 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 07/31/2023 9:00 A M Medical Record Number: [id number] Patient Account Number: [id number] Date of Birth/Sex: Treating RN: 10-Mar-1986 (37 y.o. 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 He has been tested for sickle cell disease and does not have it. This has been a recurrent issue for several years now. He has primarily been followed by podiatry. They obtained vascular studies in 2022. ABI results are copied here: +-------+-----------+-----------+------------+------------+ ABI/TBIT oday's ABIT oday's TBIPrevious ABIPrevious TBI +-------+-----------+-----------+------------+------------+ Right 1.22 0.83    +-------+-----------+-----------+------------+------------+ Left 1.26 0.74    +-------+-----------+-----------+------------+------------+ Venous reflux testing demonstrated reflux in the greater saphenous vein in the left proximal thigh, mid thigh, distal thigh, knee, and proximal calf. I do not see that he was ever referred to vascular surgery to address his venous insufficiency. He last saw podiatry 2 days ago. Apparently they dressed it with Betadine but then recommended that he apply Santyl, which he has been doing. Patient History Information obtained from Patient, Chart. Allergies lisinopril (Reaction:  unknown) Family History Hypertension - Mother, No family history of Cancer, Diabetes, Heart Disease, Hereditary Spherocytosis, Kidney Disease, Lung Disease, Seizures, Stroke, Thyroid Problems, Tuberculosis. 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 Medical History Ear/Nose/Mouth/Throat Denies history of Chronic sinus problems/congestion, Middle ear problems Respiratory Patient has history of Asthma Cardiovascular Patient has history of Hypertension, Peripheral Venous Disease Endocrine Denies history of Type I Diabetes, Type II Diabetes Integumentary (Skin) Denies history of History of Burn Psychiatric Denies history of Anorexia/bulimia, Confinement Anxiety Hospitalization/Surgery History - right shoulder repair. 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 Eyes Denies complaints or symptoms of Dry Eyes, Vision Changes, Glasses / Contacts. Ear/Nose/Mouth/Throat Denies complaints or symptoms of Chronic sinus problems or rhinitis. Respiratory Denies complaints or symptoms of Chronic or frequent coughs, Shortness of Breath. Cardiovascular Denies complaints or symptoms of Chest pain. Gastrointestinal Denies complaints or symptoms of Frequent diarrhea, Nausea, Vomiting. Endocrine Denies complaints or symptoms of Heat/cold intolerance. Genitourinary Denies complaints or symptoms of Frequent urination. Integumentary (Skin) Complains or has symptoms of Wounds - left foot. Musculoskeletal Denies complaints or symptoms of Muscle Pain, Muscle Weakness. Neurologic Denies complaints or symptoms of Numbness/parasthesias. Psychiatric Denies complaints or  symptoms of Claustrophobia. GRACESON, NICHELSON ([id number]) [id number] Page 6 of 9 Objective Constitutional Slightly hypertensive.  Bradycardic, asymptomatic. No acute distress. Vitals Time Taken: 9:21 AM, Height: 68 in, Weight: 207 lbs, BMI: 31.5, Temperature: 98.4 F, Pulse: 53 bpm, Respiratory Rate: 20 breaths/min, Blood Pressure: 148/81 mmHg. Respiratory Normal work of breathing on room air. General Notes: 07/31/2023: On his dorsal left foot at the base of the second toe, there is an oval-shaped wound full of slough. It does extend down to the muscle layer. There is a small bud of granulation tissue. There is undermining present. Integumentary (Hair, Skin) Wound #1 status is Open. Original cause of wound was Not Known. The date acquired was: 05/19/2023. The wound is located on the Left,Distal,Dorsal Foot. The wound measures 1cm length x 1cm width x 0.5cm depth; 0.785cm^2 area and 0.393cm^3 volume. There is muscle and Fat Layer (Subcutaneous Tissue) exposed. There is no tunneling noted, however, there is undermining starting at 12:00 and ending at 12:00 with a maximum distance of 0.4cm. There is a medium amount of serous drainage noted. The wound margin is well defined and not attached to the wound base. There is small (1-33%) pink granulation within the wound bed. There is a large (67-100%) amount of necrotic tissue within the wound bed including Adherent Slough. The periwound skin appearance had no abnormalities noted for texture. The periwound skin appearance had no abnormalities noted for moisture. The periwound skin appearance had no abnormalities noted for color. Periwound temperature was noted as No Abnormality. The periwound has tenderness on palpation. 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 There was a Selective/Open Wound Non-Viable Tissue Debridement with a total area of 0.78 sq cm performed by [name], MD. With the following instrument(s): Curette to remove Non-Viable tissue/material. Material removed includes [facility] and Fibrin/Exudate and after achieving pain control using Lidocaine 4% Topical Solution. No specimens were taken. A time out was conducted at 10:05, prior to the start of the procedure. A Minimum amount of bleeding was controlled with Pressure. The procedure was tolerated well with a pain level of 5 throughout and a pain level of 3 following the procedure. Post Debridement Measurements: 1cm length x 1cm width x 0.5cm depth; 0.393cm^3 volume. Character of Wound/Ulcer Post Debridement is improved. Post procedure Diagnosis Wound #1: Same as Pre-Procedure Plan Follow-up Appointments: Return Appointment in 1 week. - Dr. Lady Gary RM 1 Anesthetic: Wound #1 Left,Distal,Dorsal Foot: (In clinic) Topical Lidocaine 4% applied to wound bed Bathing/ Shower/ Hygiene: May shower and wash wound with soap and water. Edema Control - Lymphedema / SCD / Other: Elevate legs to the level of the heart or above for 30 minutes daily and/or when sitting for 3-4 times a day throughout the day. - throughout the day Avoid standing for long periods of time. Exercise regularly Moisturize legs daily. Compression stocking or Garment 20-30 mm/Hg pressure to: - both legs daily The following medication(s) was prescribed: lidocaine topical 4 % cream cream topical for prior to debridement was prescribed at facility WOUND #1: - Foot Wound Laterality: Dorsal, Left, Distal Cleanser: Wound Cleanser 1 x Per Day/30 Days Discharge Instructions: Cleanse the wound with wound cleanser prior to applying a clean dressing using gauze sponges, not tissue or cotton balls. Cleanser: Byram Ancillary Kit - 15 Day  Supply (DME) (Generic) 1 x Per Day/30 Days Discharge Instructions: Use supplies as instructed; Kit contains: (15) Saline Bullets; (15)  3x3 Gauze; 15 pr Gloves Prim Dressing: Santyl Ointment 1 x Per Day/30 Days ary Discharge Instructions: Apply nickel thick amount to wound bed as instructed Secondary Dressing: Woven Gauze Sponge, Non-Sterile 4x4 in (DME) (Generic) 1 x Per Day/30 Days Discharge Instructions: Apply over primary dressing as directed. Secured With: Insurance underwriter, Sterile 2x75 (in/in) (DME) (Generic) 1 x Per Day/30 Days SHERWIN, HOLLINGSHED ([id number]) [phone].pdf Page 7 of 9 Discharge Instructions: Secure with stretch gauze as directed. Secured With: Paper Tape, 2x10 (in/yd) (DME) (Generic) 1 x Per Day/30 Days Discharge Instructions: Secure dressing with tape as directed. 07/31/2023: This is a relatively healthy 37 year old who has had recurring ulcers on his feet and ankles. Today, on his dorsal left foot at the base of the second toe, there is an oval-shaped wound full of slough. It does extend down to the muscle layer. There is a small bud of granulation tissue. There is undermining present. I used a curette to debride slough from the wound. The wound will benefit from continued enzymatic debridement so we will continue the Santyl dressing changes for now. He had apparently been washing the site with Betadine and we have asked him to discontinue this practice and just use soap and water or wound wash. I recommended that he wear compression stocking of at least 15-20 mmHg compression whenever he is up and about. He will follow-up in 1 week. Electronic Signature(s) Signed: 08/04/2023 4:46:14 PM By: Shawn Stall RN, BSN Signed: 08/05/2023 9:58:14 AM By: [name] MD FACS Previous Signature: 07/31/2023 10:32:59 AM Version By: [name] MD FACS Previous Signature: 07/31/2023 10:23:40 AM Version By: [name] MD  FACS Entered By: Shawn Stall on 08/04/2023 16:44:09 -------------------------------------------------------------------------------- HxROS Details Patient Name: Date of Service: [name] 07/31/2023 9:00 A M Medical Record Number: [id number] Patient Account Number: [id number] Date of Birth/Sex: Treating RN: [birth year] (37 y.o. Luke Gross Primary Care Provider: Sanda Linger Other Clinician: Referring Provider: Treating Provider/Extender: Perley Jain in Treatment: 0 Information Obtained From Patient Chart Constitutional Symptoms (General Health) Complaints and Symptoms: Negative for: Fatigue; Fever; Chills; Marked Weight Change Eyes Complaints and Symptoms: Negative for: Dry Eyes; Vision Changes; Glasses / Contacts Ear/Nose/Mouth/Throat Complaints and Symptoms: Negative for: Chronic sinus problems or rhinitis Medical History: Negative for: Chronic sinus problems/congestion; Middle ear problems Past Medical History Notes: seasonal alllergies Respiratory Complaints and Symptoms: Negative for: Chronic or frequent coughs; Shortness of Breath Medical History: Positive for: Asthma Cardiovascular Complaints and Symptoms: Negative for: Chest pain Medical History: Positive for: Hypertension; Peripheral Venous Disease GAROLD, SHEELER ([id number]) [phone].pdf Page 8 of 9 Gastrointestinal Complaints and Symptoms: Negative for: Frequent diarrhea; Nausea; Vomiting Endocrine Complaints and Symptoms: Negative for: Heat/cold intolerance Medical History: Negative for: Type I Diabetes; Type II Diabetes Genitourinary Complaints and Symptoms: Negative for: Frequent urination Integumentary (Skin) Complaints and Symptoms: Positive for: Wounds - left foot Medical History: Negative for: History of Burn Past Medical History Notes: lichen simplex chronicus Musculoskeletal Complaints and  Symptoms: Negative for: Muscle Pain; Muscle Weakness Neurologic Complaints and Symptoms: Negative for: Numbness/parasthesias Psychiatric Complaints and Symptoms: Negative for: Claustrophobia Medical History: Negative for: Anorexia/bulimia; Confinement Anxiety Hematologic/Lymphatic Immunological Oncologic Immunizations Pneumococcal Vaccine: Received Pneumococcal Vaccination: No Implantable Devices No devices added Hospitalization / Surgery History Type of Hospitalization/Surgery right shoulder repair Family and Social History Cancer: No; Diabetes: No; Heart Disease: No; Hereditary Spherocytosis: No; Hypertension: Yes - Mother; Kidney Disease: No; Lung Disease: No; Seizures: No; Stroke: No; Thyroid Problems: No; Tuberculosis: No;  Current every day smoker; Marital Status - Single; Alcohol Use: Rarely; Drug Use: Current History - TCH; Caffeine Use: Rarely; Financial Concerns: No; Food, Clothing or Shelter Needs: No; Support System Lacking: No; Transportation Concerns: No Psychologist, prison and probation services) Signed: 07/31/2023 10:32:36 AM By: [name] MD FACS Signed: 07/31/2023 4:08:12 PM By: Zenaida Deed RN, BSN Entered By: Zenaida Deed on 07/31/2023 10:08:49 HY, SWIATEK ([id number]) [id number] Page 9 of 9 -------------------------------------------------------------------------------- SuperBill Details Patient Name: Date of Service: [name] 07/31/2023 Medical Record Number: [id number] Patient Account Number: [id number] Date of Birth/Sex: Treating RN: 06/19/1986 (37 y.o. 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 wit Modifier: hout evidence of ne Quantity: 1 crosis Physician Procedures : CPT4 Code Description Modifier [id number] - WC PHYS LEVEL 4 - NEW PT 25 ICD-10 Diagnosis Description L97.525 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

## 2023-07-31 NOTE — Progress Notes (Signed)
ATOM, HERRERA ([id number]) [phone] [id number] Page 1 of 4 Visit Report for 07/31/2023 Abuse Risk Screen Details Patient Name: Date of Service: Luke Luke Mocha Avala Gross. 07/31/2023 9:00 A M Medical Record Number: [id number] Patient Account Number: [id number] Date of Birth/Sex: Treating RN: 14-Jan-1986 (37 y.o. Luke Gross Primary Care Luke Gross: Luke Gross Other Clinician: Referring Luke Gross: Treating Luke Gross/Extender: Luke Gross in Treatment: 0 Abuse Risk Screen Items Answer ABUSE RISK SCREEN: Has anyone close to you tried to hurt or harm you recentlyo No Do you feel uncomfortable with anyone in your familyo No Has anyone forced you do things that you didnt want to doo No Electronic Signature(s) Signed: 07/31/2023 4:08:12 PM By: Zenaida Deed RN, BSN Entered By: Zenaida Deed on 07/31/2023 09:45:19 -------------------------------------------------------------------------------- Activities of Daily Living Details Patient Name: Date of Service: Luke Luke Mocha [facility] Gross. 07/31/2023 9:00 A M Medical Record Number: [id number] Patient Account Number: [id number] Date of Birth/Sex: Treating RN: 1986/01/06 (37 y.o. Luke Gross Primary Care Luke Gross: Luke Gross Other Clinician: Referring Luke Gross: Treating Luke Gross/Extender: Luke Gross in Treatment: 0 Activities of Daily Living Items Answer Activities of Daily Living (Please select one for each item) Drive Automobile Completely Able T Medications ake Completely Able Use T elephone Completely Able Care for Appearance Completely Able Use T oilet Completely Able Bath / Shower Completely Able Dress Self Completely Able Feed Self Completely Able Walk Completely Able Get In / Out Bed Completely Able Housework Completely Able Prepare Meals Completely Able Handle Money Completely Able Shop for Self Completely Able Electronic  Signature(s) Signed: 07/31/2023 4:08:12 PM By: Zenaida Deed RN, BSN Entered By: Zenaida Deed on 07/31/2023 09:45:50 Luke Gross ([id number]) [phone] [id number] Page 2 of 4 -------------------------------------------------------------------------------- Education Screening Details Patient Name: Date of Service: Luke Luke Stagers. 07/31/2023 9:00 A M Medical Record Number: [id number] Patient Account Number: [id number] Date of Birth/Sex: Treating RN: 04-Jul-1986 (38 y.o. Luke Gross Primary Care Luke Gross: Luke Gross Other Clinician: Referring Luke Gross: Treating Luke Gross/Extender: Luke Gross in Treatment: 0 Primary Learner Assessed: Patient Learning Preferences/Education Level/Primary Language Learning Preference: Explanation, Demonstration, Printed Material Highest Education Level: College or Above Preferred Language: English Cognitive Barrier Language Barrier: No Translator Needed: No Memory Deficit: No Emotional Barrier: No Cultural/Religious Beliefs Affecting Medical Care: No Physical Barrier Impaired Vision: No Impaired Hearing: No Decreased Hand dexterity: No Knowledge/Comprehension Knowledge Level: High Comprehension Level: High Ability to understand written instructions: High Ability to understand verbal instructions: High Motivation Anxiety Level: Calm Cooperation: Cooperative Education Importance: Acknowledges Need Interest in Health Problems: Asks Questions Perception: Coherent Willingness to Engage in Self-Management High Activities: Readiness to Engage in Self-Management High Activities: Electronic Signature(s) Signed: 07/31/2023 4:08:12 PM By: Zenaida Deed RN, BSN Entered By: Zenaida Deed on 07/31/2023 09:46:28 -------------------------------------------------------------------------------- Fall Risk Assessment Details Patient Name: Date of Service: Luke Luke Mocha  PH[facility] Gross. 07/31/2023 9:00 A M Medical Record Number: [id number] Patient Account Number: [id number] Date of Birth/Sex: Treating RN: [birth year] (36 y.o. Luke Gross Primary Care Luke Gross: Luke Gross Other Clinician: Referring Luke Gross: Treating Luke Gross/Extender: Luke Gross in Treatment: 0 Fall Risk Assessment Items Have you had 2 or more falls in the last 12 monthso 0 No Luke Gross ([id number]) [phone] [id number] Page 3 of 4 Have you had any fall that resulted in injury in the last 12 monthso 0 No FALLS RISK SCREEN History of falling - immediate or within 3 months 0 No Secondary diagnosis (Do you have 2  or more medical diagnoseso) 0 No Ambulatory aid None/bed rest/wheelchair/nurse 0 Yes Crutches/cane/walker 0 No Furniture 0 No Intravenous therapy Access/Saline/Heparin Lock 0 No Gait/Transferring Normal/ bed rest/ wheelchair 0 Yes Weak (short steps with or without shuffle, stooped but able to lift head while walking, may seek 0 No support from furniture) Impaired (short steps with shuffle, may have difficulty arising from chair, head down, impaired 0 No balance) Mental Status Oriented to own ability 0 Yes Electronic Signature(s) Signed: 07/31/2023 4:08:12 PM By: Zenaida Deed RN, BSN Entered By: Zenaida Deed on 07/31/2023 09:47:03 -------------------------------------------------------------------------------- Foot Assessment Details Patient Name: Date of Service: Luke Luke Mocha PHER Gross. 07/31/2023 9:00 A M Medical Record Number: [id number] Patient Account Number: [id number] Date of Birth/Sex: Treating RN: 03/11/1986 (37 y.o. Luke Gross Primary Care Luke Gross: Luke Gross Other Clinician: Referring Luke Gross: Treating Luke Gross/Extender: Luke Gross in Treatment: 0 Foot Assessment Items Site Locations + = Sensation present, - = Sensation absent, C = Callus, U =  Ulcer R = Redness, W = Warmth, M = Maceration, PU = Pre-ulcerative lesion F = Fissure, S = Swelling, D = Dryness Assessment Right: Left: Other Deformity: No No Prior Foot Ulcer: Yes Yes Prior Amputation: No No Charcot Joint: No No Ambulatory Status: Ambulatory Without Help GaitKJON, Gross ([id number]) [phone] [id number] Page 4 of 4 Electronic Signature(s) Signed: 07/31/2023 4:08:12 PM By: Zenaida Deed RN, BSN Entered By: Zenaida Deed on 07/31/2023 09:51:59 -------------------------------------------------------------------------------- Nutrition Risk Screening Details Patient Name: Date of Service: Luke Luke Mocha PHER Gross. 07/31/2023 9:00 A M Medical Record Number: [id number] Patient Account Number: [id number] Date of Birth/Sex: Treating RN: May 10, 1986 (37 y.o. Luke Gross Primary Care Fatma Rutten: Luke Gross Other Clinician: Referring Brailon Don: Treating Rohith Fauth/Extender: Luke Gross in Treatment: 0 Height (in): 68 Weight (lbs): 207 Body Mass Index (BMI): 31.5 Nutrition Risk Screening Items Score Screening NUTRITION RISK SCREEN: I have an illness or condition that made me change the kind and/or amount of food I eat 0 No I eat fewer than two meals per day 0 No I eat few fruits and vegetables, or milk products 0 No I have three or more drinks of beer, liquor or wine almost every day 0 No I have tooth or mouth problems that make it hard for me to eat 0 No I don't always have enough money to buy the food I need 0 No I eat alone most of the time 1 Yes I take three or more different prescribed or over-the-counter drugs a day 0 No Without wanting to, I have lost or gained 10 pounds in the last six months 0 No I am not always physically able to shop, cook and/or feed myself 0 No Nutrition Protocols Good Risk Protocol 0 No interventions needed Moderate Risk Protocol High Risk Proctocol Risk Level:  Good Risk Score: 1 Electronic Signature(s) Signed: 07/31/2023 4:08:12 PM By: Zenaida Deed RN, BSN Entered By: Zenaida Deed on 07/31/2023 09:48:00

## 2023-07-31 NOTE — Progress Notes (Signed)
ENOCK, ANASTASIOU ([id number]) [id number] Page 1 of 8 Visit Report for 07/31/2023 Allergy List Details Patient Name: Date of Service: [place] Luke Mocha [facility] L. 07/31/2023 9:00 A M Medical Record Number: [id number] Patient Account Number: [id number] Date of Birth/Sex: Treating RN: 03/13/1986 (37 y.o. [name] Primary Care Saharra Santo: Sanda Linger Other Clinician: Referring Zamier Eggebrecht: Treating Pawel Soules/Extender: Perley Jain in Treatment: 0 Allergies Active Allergies lisinopril Reaction: unknown Allergy Notes Electronic Signature(s) Signed: 07/31/2023 4:08:12 PM By: Zenaida Deed RN, BSN Entered By: Zenaida Deed on 07/31/2023 09:37:31 -------------------------------------------------------------------------------- Arrival Information Details Patient Name: Date of Service: Luke Luke Mocha PHER L. 07/31/2023 9:00 A M Medical Record Number: [id number] Patient Account Number: [id number] Date of Birth/Sex: Treating RN: 10-17-1986 (37 y.o. M) Primary Care Kanin Lia: Sanda Linger Other Clinician: Referring Deondria Puryear: Treating Garren Greenman/Extender: Perley Jain in Treatment: 0 Visit Information Patient Arrived: Ambulatory Arrival Time: 09:21 Accompanied By: self Transfer Assistance: None Patient Identification Verified: Yes Secondary Verification Process Completed: Yes Patient Requires Transmission-Based Precautions: No Patient Has Alerts: No Electronic Signature(s) Signed: 07/31/2023 4:08:12 PM By: Zenaida Deed RN, BSN Previous Signature: 07/31/2023 9:37:02 AM Version By: [place] Scrape Entered By: Zenaida Deed on 07/31/2023 09:36:45 Clinic Level of Care Assessment Details -------------------------------------------------------------------------------- Luke Gross ([id number]) [id number] Page 2 of 8 Patient Name: Date of Service: [name].  07/31/2023 9:00 A M Medical Record Number: [id number] Patient Account Number: [id number] Date of Birth/Sex: Treating RN: 20-May-1986 (37 y.o. [name] Primary Care Armstrong Creasy: Sanda Linger Other Clinician: Referring Audryana Hockenberry: Treating Tsuruko Murtha/Extender: Perley Jain in Treatment: 0 Clinic Level of Care Assessment Items TOOL 1 Quantity Score []  - 0 Use when EandM and Procedure is performed on INITIAL visit ASSESSMENTS - Nursing Assessment / Reassessment X- 1 20 General Physical Exam (combine w/ comprehensive assessment (listed just below) when performed on new pt. evals) X- 1 25 Comprehensive Assessment (HX, ROS, Risk Assessments, Wounds Hx, etc.) ASSESSMENTS - Wound and Skin Assessment / Reassessment []  - 0 Dermatologic / Skin Assessment (not related to wound area) ASSESSMENTS - Ostomy and/or Continence Assessment and Care []  - 0 Incontinence Assessment and Management []  - 0 Ostomy Care Assessment and Management (repouching, etc.) PROCESS - Coordination of Care X - Simple Patient / Family Education for ongoing care 1 15 []  - 0 Complex (extensive) Patient / Family Education for ongoing care X- 1 10 Staff obtains Chiropractor, Records, T Results / Process Orders est []  - 0 Staff telephones HHA, Nursing Homes / Clarify orders / etc []  - 0 Routine Transfer to another Facility (non-emergent condition) []  - 0 Routine Hospital Admission (non-emergent condition) X- 1 15 New Admissions / Manufacturing engineer / Ordering NPWT Apligraf, etc. , []  - 0 Emergency Hospital Admission (emergent condition) PROCESS - Special Needs []  - 0 Pediatric / Minor Patient Management []  - 0 Isolation Patient Management []  - 0 Hearing / Language / Visual special needs []  - 0 Assessment of Community assistance (transportation, D/C planning, etc.) []  - 0 Additional assistance / Altered mentation []  - 0 Support Surface(s) Assessment (bed, cushion, seat,  etc.) INTERVENTIONS - Miscellaneous []  - 0 External ear exam []  - 0 Patient Transfer (multiple staff / Nurse, adult / Similar devices) []  - 0 Simple Staple / Suture removal (25 or less) []  - 0 Complex Staple / Suture removal (26 or more) []  - 0 Hypo/Hyperglycemic Management (do not check if billed separately) []  - 0 Ankle / Brachial Index (ABI) - do not  check if billed separately Has the patient been seen at the hospital within the last three years: Yes Total Score: 85 Level Of Care: New/Established - Level 3 Electronic Signature(s) Signed: 07/31/2023 4:08:12 PM By: Zenaida Deed RN, BSN Entered By: Zenaida Deed on 07/31/2023 10:34:30 Luke Gross ([id number]) [id number] Page 3 of 8 -------------------------------------------------------------------------------- Encounter Discharge Information Details Patient Name: Date of Service: [name]. 07/31/2023 9:00 A M Medical Record Number: [id number] Patient Account Number: [id number] Date of Birth/Sex: Treating RN: 07-Feb-1986 (37 y.o. [name] Primary Care Anamarie Hunn: Sanda Linger Other Clinician: Referring Ameris Akamine: Treating Hokulani Rogel/Extender: Perley Jain in Treatment: 0 Encounter Discharge Information Items Post Procedure Vitals Discharge Condition: Stable Temperature (F): 98.4 Ambulatory Status: Ambulatory Pulse (bpm): 53 Discharge Destination: Home Respiratory Rate (breaths/min): 18 Transportation: Private Auto Blood Pressure (mmHg): 148/81 Accompanied By: self Schedule Follow-up Appointment: Yes Clinical Summary of Care: Patient Declined Electronic Signature(s) Signed: 07/31/2023 4:08:12 PM By: Zenaida Deed RN, BSN Entered By: Zenaida Deed on 07/31/2023 15:52:23 -------------------------------------------------------------------------------- Lower Extremity Assessment Details Patient Name: Date of Service: Luke Luke Mocha  PHER L. 07/31/2023 9:00 A M Medical Record Number: [id number] Patient Account Number: [id number] Date of Birth/Sex: Treating RN: Oct 01, 1986 (37 y.o. [name] Primary Care Destynie Toomey: Sanda Linger Other Clinician: Referring Yvett Rossel: Treating Avyukth Bontempo/Extender: Perley Jain in Treatment: 0 Edema Assessment Assessed: [name]: No] [Right: No] Edema: [Left: N] [Right: o] Calf Left: Right: Point of Measurement: From Medial Instep 38 cm Ankle Left: Right: Point of Measurement: From Medial Instep 23 cm Vascular Assessment Pulses: Dorsalis Pedis Palpable: [Left:Yes] Extremity colors, hair growth, and conditions: Extremity Color: [Left:Normal] Hair Growth on Extremity: [Left:Yes] Temperature of Extremity: [Left:Warm] Capillary Refill: [Left:< 3 seconds] Dependent Rubor: [Left:No] Blanched when Elevated: [Left:No No] Toe Nail Assessment RUGER, OBERLANDER ([id number]) [phone].pdf Page 4 of 8 Left: Right: Thick: No Discolored: No Deformed: No Improper Length and Hygiene: No Electronic Signature(s) Signed: 07/31/2023 4:08:12 PM By: Zenaida Deed RN, BSN Entered By: Zenaida Deed on 07/31/2023 09:54:18 -------------------------------------------------------------------------------- Multi Wound Chart Details Patient Name: Date of Service: Luke Luke Mocha PHER L. 07/31/2023 9:00 A M Medical Record Number: [id number] Patient Account Number: [id number] Date of Birth/Sex: Treating RN: 07/06/1986 (37 y.o. M) Primary Care Ky Rumple: Sanda Linger Other Clinician: Referring Yutaka Holberg: Treating Owin Vignola/Extender: Perley Jain in Treatment: 0 Vital Signs Height(in): 68 Pulse(bpm): 53 Weight(lbs): 207 Blood Pressure(mmHg): 148/81 Body Mass Index(BMI): 31.5 Temperature(F): 98.4 Respiratory Rate(breaths/min): 20 [1:Photos:] [N/A:N/A] Left, Distal Foot N/A N/A Wound Location: Not Known N/A  N/A Wounding Event: T be determined o N/A N/A Primary Etiology: Asthma, Hypertension N/A N/A Comorbid History: 05/19/2023 N/A N/A Date Acquired: 0 N/A N/A Weeks of Treatment: Open N/A N/A Wound Status: No N/A N/A Wound Recurrence: 1x1x0.5 N/A N/A Measurements L x W x D (cm) 0.785 N/A N/A A (cm) : rea 0.393 N/A N/A Volume (cm) : 12 Starting Position 1 (o'clock): 12 Ending Position 1 (o'clock): 0.4 Maximum Distance 1 (cm): Yes N/A N/A Undermining: Full Thickness With Exposed Support N/A N/A Classification: Structures Medium N/A N/A Exudate A mount: Serous N/A N/A Exudate Type: amber N/A N/A Exudate Color: Well defined, not attached N/A N/A Wound Margin: Small (1-33%) N/A N/A Granulation A mount: Pink N/A N/A Granulation Quality: Large (67-100%) N/A N/A Necrotic A mount: Fat Layer (Subcutaneous Tissue): Yes N/A N/A Exposed Structures: Muscle: Yes Fascia: No Tendon: No Joint: No Bone: No None N/A N/A Epithelialization: Debridement - Selective/Open Wound N/A N/A Debridement: Pre-procedure  Verification/Time Out 10:05 N/A N/A Luke Gross, Luke Gross ([id number]) [id number].pdf Page 5 of 8 Taken: Lidocaine 4% Topical Solution N/A N/A Pain Control: Slough N/A N/A Tissue Debrided: Non-Viable Tissue N/A N/A Level: 0.78 N/A N/A Debridement A (sq cm): rea Curette N/A N/A Instrument: Minimum N/A N/A Bleeding: Pressure N/A N/A Hemostasis A chieved: 5 N/A N/A Procedural Pain: 3 N/A N/A Post Procedural Pain: Procedure was tolerated well N/A N/A Debridement Treatment Response: 1x1x0.5 N/A N/A Post Debridement Measurements L x W x D (cm) 0.393 N/A N/A Post Debridement Volume: (cm) No Abnormalities Noted N/A N/A Periwound Skin Texture: No Abnormalities Noted N/A N/A Periwound Skin Moisture: No Abnormalities Noted N/A N/A Periwound Skin Color: No Abnormality N/A N/A Temperature: Yes N/A N/A Tenderness on  Palpation: Debridement N/A N/A Procedures Performed: Treatment Notes Electronic Signature(s) Signed: 07/31/2023 10:20:37 AM By: [name] MD FACS Entered By: [name] on 07/31/2023 10:20:37 -------------------------------------------------------------------------------- Multi-Disciplinary Care Plan Details Patient Name: Date of Service: Luke Luke Mocha PHER L. 07/31/2023 9:00 A M Medical Record Number: [id number] Patient Account Number: [id number] Date of Birth/Sex: Treating RN: 03/13/1986 (37 y.o. [name] Primary Care Zayonna Ayuso: Sanda Linger Other Clinician: Referring Amen Staszak: Treating Elynn Patteson/Extender: Perley Jain in Treatment: 0 Multidisciplinary Care Plan reviewed with physician Active Inactive Wound/Skin Impairment Nursing Diagnoses: Impaired tissue integrity Knowledge deficit related to ulceration/compromised skin integrity Goals: Patient/caregiver will verbalize understanding of skin care regimen Date Initiated: 07/31/2023 Target Resolution Date: 08/28/2023 Goal Status: Active Ulcer/skin breakdown will have a volume reduction of 30% by week 4 Date Initiated: 07/31/2023 Target Resolution Date: 08/28/2023 Goal Status: Active Interventions: Assess patient/caregiver ability to obtain necessary supplies Assess patient/caregiver ability to perform ulcer/skin care regimen upon admission and as needed Assess ulceration(s) every visit Provide education on smoking Provide education on ulcer and skin care Treatment Activities: Referred to DME Zerick Prevette for dressing supplies : 07/31/2023 Skin care regimen initiated : 07/31/2023 Topical wound management initiated : 07/31/2023 Notes: Luke Gross, Luke Gross ([id number]) [phone].pdf Page 6 of 8 Electronic Signature(s) Signed: 07/31/2023 4:08:12 PM By: Zenaida Deed RN, BSN Entered By: Zenaida Deed on 07/31/2023  10:12:20 -------------------------------------------------------------------------------- Pain Assessment Details Patient Name: Date of Service: Luke Luke Mocha PHER L. 07/31/2023 9:00 A M Medical Record Number: [id number] Patient Account Number: [id number] Date of Birth/Sex: Treating RN: 07-04-1986 (37 y.o. M) Primary Care Clarie Camey: Sanda Linger Other Clinician: Referring Rhetta Cleek: Treating Robbi Spells/Extender: Perley Jain in Treatment: 0 Active Problems Location of Pain Severity and Description of Pain Patient Has Paino No Site Locations Rate the pain. Current Pain Level: 0 Character of Pain Describe the Pain: Tender Pain Management and Medication Current Pain Management: Electronic Signature(s) Signed: 07/31/2023 4:08:12 PM By: Zenaida Deed RN, BSN Previous Signature: 07/31/2023 9:37:02 AM Version By: [place] Scrape Entered By: Zenaida Deed on 07/31/2023 10:02:49 -------------------------------------------------------------------------------- Patient/Caregiver Education Details Patient Name: Date of Service: Luke Gross 9/26/2024andnbsp9:00 A M Medical Record Number: [id number] Patient Account Number: [id number] Date of Birth/Gender: Treating RN: [birth year] (37 y.o. [name] Primary Care Physician: Sanda Linger Other Clinician: Referring Physician: Treating Physician/Extender: Perley Jain in Treatment: 0 Luke Gross, Luke Gross ([id number]) [id number] Page 7 of 8 Education Assessment Education Provided To: Patient Education Topics Provided Welcome [facility]New [facility] Packet: o Handouts: Welcome [facility] o Methods: Explain/Verbal, Printed Responses: Reinforcements needed, State content correctly Wound/Skin Impairment: Handouts: Caring for Your Ulcer, Smoking and Wound Healing Methods: Explain/Verbal, Printed Responses: Reinforcements  needed, State content  correctly Electronic Signature(s) Signed: 07/31/2023 4:08:12 PM By: Zenaida Deed RN, BSN Entered By: Zenaida Deed on 07/31/2023 10:13:23 -------------------------------------------------------------------------------- Wound Assessment Details Patient Name: Date of Service: Luke Luke Mocha PHER L. 07/31/2023 9:00 A M Medical Record Number: [id number] Patient Account Number: [id number] Date of Birth/Sex: Treating RN: 05/12/1986 (37 y.o. [name] Primary Care Yoanna Jurczyk: Sanda Linger Other Clinician: Referring Shahin Knierim: Treating Kristjan Derner/Extender: Perley Jain in Treatment: 0 Wound Status Wound Number: 1 Primary Etiology: T be determined o Wound Location: Left, Distal Foot Wound Status: Open Wounding Event: Not Known Comorbid History: Asthma, Hypertension Date Acquired: 05/19/2023 Weeks Of Treatment: 0 Clustered Wound: No Photos Wound Measurements Length: (cm) 1 Width: (cm) 1 Depth: (cm) 0.5 Area: (cm) 0.785 Volume: (cm) 0.393 % Reduction in Area: % Reduction in Volume: Epithelialization: None Tunneling: No Undermining: Yes Starting Position (o'clock): 12 Ending Position (o'clock): 12 Maximum Distance: (cm) 0.4 Wound Description Luke Gross, Luke Gross ([id number]) Classification: Full Thickness With Exposed Support Structures Wound Margin: Well defined, not attached Exudate Amount: Medium Exudate Type: Serous Exudate Color: amber [phone].pdf Page 8 of 8 Foul Odor After Cleansing: No Slough/Fibrino Yes Wound Bed Granulation Amount: Small (1-33%) Exposed Structure Granulation Quality: Pink Fascia Exposed: No Necrotic Amount: Large (67-100%) Fat Layer (Subcutaneous Tissue) Exposed: Yes Necrotic Quality: Adherent Slough Tendon Exposed: No Muscle Exposed: Yes Necrosis of Muscle: No Joint Exposed: No Bone Exposed: No Periwound Skin Texture Texture Color No Abnormalities Noted:  Yes No Abnormalities Noted: Yes Moisture Temperature / Pain No Abnormalities Noted: Yes Temperature: No Abnormality Tenderness on Palpation: Yes Electronic Signature(s) Signed: 07/31/2023 4:08:12 PM By: Zenaida Deed RN, BSN Previous Signature: 07/31/2023 9:37:02 AM Version By: [place] Scrape Entered By: Zenaida Deed on 07/31/2023 09:56:52 -------------------------------------------------------------------------------- Vitals Details Patient Name: Date of Service: Luke Luke Mocha PHER L. 07/31/2023 9:00 A M Medical Record Number: [id number] Patient Account Number: [id number] Date of Birth/Sex: Treating RN: 07/04/1986 (37 y.o. M) Primary Care Eriko Economos: Sanda Linger Other Clinician: Referring Maddyx Vallie: Treating Alonso Gapinski/Extender: Perley Jain in Treatment: 0 Vital Signs Time Taken: 09:21 Temperature (F): 98.4 Height (in): 68 Pulse (bpm): 53 Weight (lbs): 207 Respiratory Rate (breaths/min): 20 Body Mass Index (BMI): 31.5 Blood Pressure (mmHg): 148/81 Reference Range: 80 - 120 mg / dl Electronic Signature(s) Signed: 07/31/2023 4:08:12 PM By: Zenaida Deed RN, BSN Previous Signature: 07/31/2023 9:37:02 AM Version By: [place] Scrape Entered By: Zenaida Deed on 07/31/2023 09:36:58

## 2023-08-08 ENCOUNTER — Encounter (HOSPITAL_BASED_OUTPATIENT_CLINIC_OR_DEPARTMENT_OTHER): Payer: BC Managed Care – PPO | Attending: General Surgery | Admitting: General Surgery

## 2023-08-08 DIAGNOSIS — I10 Essential (primary) hypertension: Secondary | ICD-10-CM | POA: Diagnosis not present

## 2023-08-08 DIAGNOSIS — L97522 Non-pressure chronic ulcer of other part of left foot with fat layer exposed: Secondary | ICD-10-CM | POA: Insufficient documentation

## 2023-08-08 DIAGNOSIS — L97525 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 without evidence of necrosis: Secondary | ICD-10-CM | POA: Insufficient documentation

## 2023-08-08 DIAGNOSIS — F17200 Nicotine dependence, unspecified, uncomplicated: Secondary | ICD-10-CM | POA: Insufficient documentation

## 2023-08-08 DIAGNOSIS — I872 Venous insufficiency (chronic) (peripheral): Secondary | ICD-10-CM | POA: Diagnosis not present

## 2023-08-08 NOTE — Progress Notes (Signed)
JARVIS, SAWA ([id number]) [id number] Page 1 of 9 Visit Report for 08/08/2023 Arrival Information Details Patient Name: Date of Service: Luke Gross 08/08/2023 8:45 A M Medical Record Number: [id number] Patient Account Number: [id number] Date of Birth/Sex: Treating RN: 1986/01/15 (37 y.o. Dianna Limbo Primary Care Samad Thon: Sanda Linger Other Clinician: Referring Sierrah Luevano: Treating Kalee Broxton/Extender: Royston Sinner in Treatment: 1 Visit Information History Since Last Visit Added or deleted any medications: No Patient Arrived: Ambulatory Any new allergies or adverse reactions: No Arrival Time: 09:05 Had a fall or experienced change in No Accompanied By: self activities of daily living that may affect Transfer Assistance: None risk of falls: Patient Identification Verified: Yes Signs or symptoms of abuse/neglect since last visito No Patient Requires Transmission-Based Precautions: No Hospitalized since last visit: No Patient Has Alerts: No Implantable device outside of the clinic excluding No cellular tissue based products placed in the center since last visit: Has Dressing in Place as Prescribed: Yes Pain Present Now: Yes Electronic Signature(s) Signed: 08/08/2023 11:43:25 AM By: Karie Schwalbe RN Entered By: Karie Schwalbe on 08/08/2023 06:05:42 -------------------------------------------------------------------------------- Encounter Discharge Information Details Patient Name: Date of Service: Luke [name] PHER Gross. 08/08/2023 8:45 A M Medical Record Number: [id number] Patient Account Number: [id number] Date of Birth/Sex: Treating RN: 03-06-1986 (37 y.o. Dianna Limbo Primary Care Grenda Lora: Sanda Linger Other Clinician: Referring Daisean Brodhead: Treating Inis Borneman/Extender: Royston Sinner in Treatment: 1 Encounter Discharge Information Items Post Procedure Vitals Discharge  Condition: Stable Temperature (F): 98.3 Ambulatory Status: Ambulatory Pulse (bpm): 75 Discharge Destination: Home Respiratory Rate (breaths/min): 16 Transportation: Private Auto Blood Pressure (mmHg): 142/75 Accompanied By: self Schedule Follow-up Appointment: Yes Clinical Summary of Care: Patient Declined Electronic Signature(s) Signed: 08/08/2023 11:43:25 AM By: Karie Schwalbe RN Entered By: Karie Schwalbe on 08/08/2023 08:37:42 [name] ([id number]) [id number] Page 2 of 9 -------------------------------------------------------------------------------- Lower Extremity Assessment Details Patient Name: Date of Service: Luke Gross 08/08/2023 8:45 A M Medical Record Number: [id number] Patient Account Number: [id number] Date of Birth/Sex: Treating RN: 09/04/1986 (37 y.o. Dianna Limbo Primary Care Trude Cansler: Sanda Linger Other Clinician: Referring Ciria Bernardini: Treating Shelby Peltz/Extender: Royston Sinner in Treatment: 1 Edema Assessment Assessed: [name]: No] [name]: No] Edema: [Left: N] [Right: o] Calf Left: Right: Point of Measurement: From Medial Instep 36.9 cm Ankle Left: Right: Point of Measurement: From Medial Instep 23 cm Vascular Assessment Pulses: Dorsalis Pedis Palpable: [Left:Yes] Extremity colors, hair growth, and conditions: Extremity Color: [Left:Normal] Hair Growth on Extremity: [Left:Yes] Temperature of Extremity: [Left:Warm] Capillary Refill: [Left:< 3 seconds] Dependent Rubor: [Left:No No] Electronic Signature(s) Signed: 08/08/2023 11:43:25 AM By: Karie Schwalbe RN Entered By: Karie Schwalbe on 08/08/2023 06:12:31 -------------------------------------------------------------------------------- Multi Wound Chart Details Patient Name: Date of Service: Luke [name] PHER Gross. 08/08/2023 8:45 A M Medical Record Number: [id number] Patient Account Number: [id number] Date of  Birth/Sex: Treating RN: February 21, 1986 (37 y.o. M) Primary Care Reinhold Rickey: Sanda Linger Other Clinician: Referring Tayia Stonesifer: Treating Gianluca Chhim/Extender: Royston Sinner in Treatment: 1 Vital Signs Height(in): 68 Pulse(bpm): 75 Weight(lbs): 207 Blood Pressure(mmHg): 142/75 Body Mass Index(BMI): 31.5 Temperature(F): 98.3 Respiratory Rate(breaths/min): 16 [1:Photos:] [N/A:N/A [id number] Page 3 of 9] Left, Distal, Dorsal Foot Left, Proximal, Dorsal Foot N/A Wound Location: Gradually Appeared Gradually Appeared N/A Wounding Event: Atypical Trauma, Other N/A Primary Etiology: Asthma, Hypertension, Peripheral Asthma, Hypertension, Peripheral N/A Comorbid History: Venous Disease Venous Disease 05/19/2023 08/05/2023 N/A Date Acquired: 1 0 N/A Weeks of Treatment: Open Open N/A Wound Status: No  No N/A Wound Recurrence: 0.7x1.2x0.4 0.2x0.2x0.1 N/A Measurements Gross x W x D (cm) 0.66 0.031 N/A A (cm) : rea 0.264 0.003 N/A Volume (cm) : 15.90% N/A N/A % Reduction in A rea: 32.80% N/A N/A % Reduction in Volume: 9 Position 1 (o'clock): 0.8 Maximum Distance 1 (cm): 12 Starting Position 1 (o'clock): 12 Ending Position 1 (o'clock): 0.4 Maximum Distance 1 (cm): Yes No N/A Tunneling: Yes No N/A Undermining: Full Thickness With Exposed Support Full Thickness Without Exposed N/A Classification: Structures Support Structures Medium Medium N/A Exudate A mount: Serous Serosanguineous N/A Exudate Type: amber red, brown N/A Exudate Color: Well defined, not attached Distinct, outline attached N/A Wound Margin: Small (1-33%) Small (1-33%) N/A Granulation A mount: Pink Red N/A Granulation Quality: Large (67-100%) Large (67-100%) N/A Necrotic A mount: Adherent Slough Eschar, Adherent Slough N/A Necrotic Tissue: Fat Layer (Subcutaneous Tissue): Yes Fat Layer (Subcutaneous Tissue): Yes N/A Exposed Structures: Muscle: Yes Fascia:  No Tendon: No Joint: No Bone: No None Small (1-33%) N/A Epithelialization: Debridement - Excisional Debridement - Selective/Open Wound N/A Debridement: Pre-procedure Verification/Time Out 09:28 09:28 N/A Taken: Lidocaine 4% Topical Solution Lidocaine 4% Topical Solution N/A Pain Control: Subcutaneous, Slough Necrotic/Eschar, [employer] N/A Tissue Debrided: Skin/Subcutaneous Tissue Non-Viable Tissue N/A Level: 0.66 0.03 N/A Debridement A (sq cm): rea Curette Curette N/A Instrument: Minimum Minimum N/A Bleeding: Pressure Pressure N/A Hemostasis A chieved: 0 0 N/A Procedural Pain: 0 0 N/A Post Procedural Pain: Procedure was tolerated well Procedure was tolerated well N/A Debridement Treatment Response: 0.7x1.2x0.4 0.2x0.2x0.1 N/A Post Debridement Measurements Gross x W x D (cm) 0.264 0.003 N/A Post Debridement Volume: (cm) No Abnormalities Noted No Abnormalities Noted N/A Periwound Skin Texture: No Abnormalities Noted No Abnormalities Noted N/A Periwound Skin Moisture: No Abnormalities Noted No Abnormalities Noted N/A Periwound Skin Color: No Abnormality No Abnormality N/A Temperature: Yes N/A N/A Tenderness on Palpation: Debridement Debridement N/A Procedures Performed: Treatment Notes Electronic Signature(s) Signed: 08/08/2023 9:35:13 AM By: [name] MD FACS Entered By: [name] on 08/08/2023 06:35:13 [name] ([id number]) [id number] Page 4 of 9 -------------------------------------------------------------------------------- Multi-Disciplinary Care Plan Details Patient Name: Date of Service: Luke Gross 08/08/2023 8:45 A M Medical Record Number: [id number] Patient Account Number: [id number] Date of Birth/Sex: Treating RN: May 15, 1986 (37 y.o. Dianna Limbo Primary Care Henryetta Corriveau: Sanda Linger Other Clinician: Referring Manuel Lawhead: Treating Bernese Doffing/Extender: Royston Sinner  in Treatment: 1 Multidisciplinary Care Plan reviewed with physician Active Inactive Wound/Skin Impairment Nursing Diagnoses: Impaired tissue integrity Knowledge deficit related to ulceration/compromised skin integrity Goals: Patient/caregiver will verbalize understanding of skin care regimen Date Initiated: 07/31/2023 Target Resolution Date: 09/28/2023 Goal Status: Active Ulcer/skin breakdown will have a volume reduction of 30% by week 4 Date Initiated: 07/31/2023 Target Resolution Date: 09/28/2023 Goal Status: Active Interventions: Assess patient/caregiver ability to obtain necessary supplies Assess patient/caregiver ability to perform ulcer/skin care regimen upon admission and as needed Assess ulceration(s) every visit Provide education on smoking Provide education on ulcer and skin care Treatment Activities: Referred to DME Shylah Dossantos for dressing supplies : 07/31/2023 Skin care regimen initiated : 07/31/2023 Topical wound management initiated : 07/31/2023 Notes: Electronic Signature(s) Signed: 08/08/2023 11:43:25 AM By: Karie Schwalbe RN Entered By: Karie Schwalbe on 08/08/2023 06:44:33 -------------------------------------------------------------------------------- Pain Assessment Details Patient Name: Date of Service: Luke Obey PHER Gross. 08/08/2023 8:45 A M Medical Record Number: [id number] Patient Account Number: [id number] Date of Birth/Sex: Treating RN: 1986-02-10 (37 y.o. Dianna Limbo Primary Care Terease Marcotte: Sanda Linger Other Clinician: Referring Hobert Poplaski: Treating Aniylah Avans/Extender: Lady Gary  Berniece Pap Weeks in Treatment: 1 Active Problems Location of Pain Severity and Description of Pain Patient Has Paino Yes Site Locations Pain LocationCORNIE, HERRINGTON ([id number]) [id number] Page 5 of 9 Pain Location: Generalized Pain With Dressing Change: No Duration of the Pain. Constant / Intermittento Constant Rate  the pain. Current Pain Level: 2 Worst Pain Level: 10 Least Pain Level: 1 Tolerable Pain Level: 3 Character of Pain Describe the Pain: Difficult to Pinpoint Pain Management and Medication Current Pain Management: Medication: Yes Cold Application: No Rest: Yes Massage: No Activity: No T.E.N.S.: No Heat Application: No Leg drop or elevation: No Is the Current Pain Management Adequate: Adequate How does your wound impact your activities of daily livingo Sleep: No Bathing: No Appetite: No Relationship With Others: No Bladder Continence: No Emotions: No Bowel Continence: No Work: No Toileting: No Drive: No Dressing: No Hobbies: No Electronic Signature(s) Signed: 08/08/2023 11:43:25 AM By: Karie Schwalbe RN Entered By: Karie Schwalbe on 08/08/2023 06:08:46 -------------------------------------------------------------------------------- Patient/Caregiver Education Details Patient Name: Date of Service: Luke Luke Gross 10/4/2024andnbsp8:45 A M Medical Record Number: [id number] Patient Account Number: [id number] Date of Birth/Gender: Treating RN: [birth year] (37 y.o. Dianna Limbo Primary Care Physician: Sanda Linger Other Clinician: Referring Physician: Treating Physician/Extender: Royston Sinner in Treatment: 1 Education Assessment Education Provided To: Patient Education Topics Provided Wound/Skin Impairment: Methods: Explain/Verbal Responses: State content correctly Electronic Signature(s) Signed: 08/08/2023 11:43:25 AM By: Karie Schwalbe RN Luke Gross, Luke Gross ([id number]) [id number] Page 6 of 9 Entered By: Karie Schwalbe on 08/08/2023 08:36:17 -------------------------------------------------------------------------------- Wound Assessment Details Patient Name: Date of Service: Luke Luke Gross 08/08/2023 8:45 A M Medical Record Number: [id number] Patient Account Number: [id number] Date of  Birth/Sex: Treating RN: [birth year] (37 y.o. Dianna Limbo Primary Care Juluis Fitzsimmons: Sanda Linger Other Clinician: Referring Delana Manganello: Treating Horrace Hanak/Extender: Royston Sinner in Treatment: 1 Wound Status Wound Number: 1 Primary Etiology: Atypical Wound Location: Left, Distal, Dorsal Foot Wound Status: Open Wounding Event: Gradually Appeared Comorbid History: Asthma, Hypertension, Peripheral Venous Disease Date Acquired: 05/19/2023 Weeks Of Treatment: 1 Clustered Wound: No Photos Wound Measurements Length: (cm) 0.7 Width: (cm) 1.2 Depth: (cm) 0.4 Area: (cm) 0.66 Volume: (cm) 0.264 % Reduction in Area: 15.9% % Reduction in Volume: 32.8% Epithelialization: None Tunneling: Yes Position (o'clock): 9 Maximum Distance: (cm) 0.8 Undermining: Yes Starting Position (o'clock): 12 Ending Position (o'clock): 12 Maximum Distance: (cm) 0.4 Wound Description Classification: Full Thickness With Exposed Suppo Wound Margin: Well defined, not attached Exudate Amount: Medium Exudate Type: Serous Exudate Color: amber rt Structures Foul Odor After Cleansing: No Slough/Fibrino Yes Wound Bed Granulation Amount: Small (1-33%) Exposed Structure Granulation Quality: Pink Fascia Exposed: No Necrotic Amount: Large (67-100%) Fat Layer (Subcutaneous Tissue) Exposed: Yes Necrotic Quality: Adherent Slough Tendon Exposed: No Muscle Exposed: Yes Necrosis of Muscle: No Joint Exposed: No Bone Exposed: No Periwound Skin Texture Texture Color Luke Gross, Luke Gross ([id number]) [id number] Page 7 of 9 No Abnormalities Noted: Yes No Abnormalities Noted: Yes Moisture Temperature / Pain No Abnormalities Noted: Yes Temperature: No Abnormality Tenderness on Palpation: Yes Treatment Notes Wound #1 (Foot) Wound Laterality: Dorsal, Left, Distal Cleanser Wound Cleanser Discharge Instruction: Cleanse the wound with wound cleanser prior to applying a  clean dressing using gauze sponges, not tissue or cotton balls. Byram Ancillary Kit - 15 Day Supply Discharge Instruction: Use supplies as instructed; Kit contains: (15) Saline Bullets; (15) 3x3 Gauze; 15 pr Gloves Peri-Wound Care Topical Primary Dressing Santyl Ointment Discharge Instruction: Apply nickel thick  amount to wound bed as instructed Secondary Dressing Woven Gauze Sponge, Non-Sterile 4x4 in Discharge Instruction: Apply over primary dressing as directed. Secured With [employer], 4.5x3.1 (in/yd) Discharge Instruction: Secure with Kerlix as directed. Paper Tape, 2x10 (in/yd) Discharge Instruction: Secure dressing with tape as directed. Compression Wrap Compression Stockings Add-Ons Electronic Signature(s) Signed: 08/08/2023 11:43:25 AM By: Karie Schwalbe RN Entered By: Karie Schwalbe on 08/08/2023 06:22:33 -------------------------------------------------------------------------------- Wound Assessment Details Patient Name: Date of Service: Luke [name] PHER Gross. 08/08/2023 8:45 A M Medical Record Number: [id number] Patient Account Number: [id number] Date of Birth/Sex: Treating RN: Nov 27, 1985 (37 y.o. Dianna Limbo Primary Care Jacquees Gongora: Sanda Linger Other Clinician: Referring Doniesha Landau: Treating Chandler Stofer/Extender: Royston Sinner in Treatment: 1 Wound Status Wound Number: 2 Primary Etiology: Venous Leg Ulcer Wound Location: Left, Proximal, Dorsal Foot Wound Status: Open Wounding Event: Gradually Appeared Comorbid History: Asthma, Hypertension, Peripheral Venous Disease Date Acquired: 08/05/2023 Weeks Of Treatment: 0 Clustered Wound: No Photos Luke Gross, Luke Gross ([id number]) [id number].pdf Page 8 of 9 Wound Measurements Length: (cm) 0.2 Width: (cm) 0.2 Depth: (cm) 0.1 Area: (cm) 0.031 Volume: (cm) 0.003 % Reduction in Area: % Reduction in Volume: Epithelialization: Small (1-33%) Tunneling:  No Undermining: No Wound Description Classification: Full Thickness Without Exposed Support Structures Wound Margin: Distinct, outline attached Exudate Amount: Medium Exudate Type: Serosanguineous Exudate Color: red, brown Foul Odor After Cleansing: No Slough/Fibrino Yes Wound Bed Granulation Amount: Small (1-33%) Exposed Structure Granulation Quality: Red Fat Layer (Subcutaneous Tissue) Exposed: Yes Necrotic Amount: Large (67-100%) Necrotic Quality: Eschar, Adherent Slough Periwound Skin Texture Texture Color No Abnormalities Noted: Yes No Abnormalities Noted: Yes Moisture Temperature / Pain No Abnormalities Noted: Yes Temperature: No Abnormality Treatment Notes Wound #2 (Foot) Wound Laterality: Dorsal, Left, Proximal Cleanser Wound Cleanser Discharge Instruction: Cleanse the wound with wound cleanser prior to applying a clean dressing using gauze sponges, not tissue or cotton balls. Byram Ancillary Kit - 15 Day Supply Discharge Instruction: Use supplies as instructed; Kit contains: (15) Saline Bullets; (15) 3x3 Gauze; 15 pr Gloves Peri-Wound Care Topical Primary Dressing Santyl Ointment Discharge Instruction: Apply nickel thick amount to wound bed as instructed Secondary Dressing Woven Gauze Sponge, Non-Sterile 4x4 in Discharge Instruction: Apply over primary dressing as directed. Secured With [employer], 4.5x3.1 (in/yd) Discharge Instruction: Secure with Kerlix as directed. Paper Tape, 2x10 (in/yd) Discharge Instruction: Secure dressing with tape as directed. Compression Wrap Compression Stockings Add-Ons Electronic Signature(s) Luke Gross, Luke Gross ([id number]) [id number] Page 9 of 9 Signed: 08/08/2023 11:43:25 AM By: Karie Schwalbe RN Entered By: Karie Schwalbe on 08/08/2023 06:38:03 -------------------------------------------------------------------------------- Vitals Details Patient Name: Date of Service: Luke [name] PHER Gross. 08/08/2023 8:45 A M Medical Record Number: [id number] Patient Account Number: [id number] Date of Birth/Sex: Treating RN: 1986/01/13 (37 y.o. Dianna Limbo Primary Care Kelie Gainey: Sanda Linger Other Clinician: Referring Kasai Beltran: Treating Nuri Larmer/Extender: Royston Sinner in Treatment: 1 Vital Signs Time Taken: 09:05 Temperature (F): 98.3 Height (in): 68 Pulse (bpm): 75 Weight (lbs): 207 Respiratory Rate (breaths/min): 16 Body Mass Index (BMI): 31.5 Blood Pressure (mmHg): 142/75 Reference Range: 80 - 120 mg / dl Electronic Signature(s) Signed: 08/08/2023 11:43:25 AM By: Karie Schwalbe RN Entered By: Karie Schwalbe on 08/08/2023 06:09:15

## 2023-08-08 NOTE — Progress Notes (Signed)
DSHAWN, MCNAY ([id number]) [id number] Page 1 of 10 Visit Report for 08/08/2023 Chief Complaint Document Details Patient Name: Date of Service: [place] Gross 08/08/2023 8:45 A M Medical Record Number: [id number] Patient Account Number: [id number] Date of Birth/Sex: Treating RN: Jan 16, 1986 (37 y.o. M) Primary Care Provider: Sanda Linger Other Clinician: Referring Provider: Treating Provider/Extender: Royston Sinner in Treatment: 1 Information Obtained from: Patient Chief Complaint Patient seen for complaints of Non-Healing Wound on foot, in setting of venous insufficiency Electronic Signature(s) Signed: 08/08/2023 9:35:18 AM By: [name] MD FACS Entered By: [name] on 08/08/2023 06:35:18 -------------------------------------------------------------------------------- Debridement Details Patient Name: Date of Service: [name] 08/08/2023 8:45 A M Medical Record Number: [id number] Patient Account Number: [id number] Date of Birth/Sex: Treating RN: April 08, 1986 (37 y.o. Dianna Limbo Primary Care Provider: Sanda Linger Other Clinician: Referring Provider: Treating Provider/Extender: Royston Sinner in Treatment: 1 Debridement Performed for Assessment: Wound #1 Left,Distal,Dorsal Foot Performed By: Physician [name], MD The following information was scribed by: Karie Schwalbe The information was scribed for: [name] Debridement Type: Debridement Level of Consciousness (Pre-procedure): Awake and Alert Pre-procedure Verification/Time Out Yes - 09:28 Taken: Start Time: 09:28 Pain Control: Lidocaine 4% T opical Solution Percent of Wound Bed Debrided: 100% T Area Debrided (cm): otal 0.66 Tissue and other material debrided: Viable, Non-Viable, Slough, Subcutaneous, Slough Level: Skin/Subcutaneous Tissue Debridement Description:  Excisional Instrument: Curette Bleeding: Minimum Hemostasis Achieved: Pressure End Time: 09:30 Procedural Pain: 0 Post Procedural Pain: 0 Response to Treatment: Procedure was tolerated well Level of Consciousness (Post- Awake and Alert procedure): Post Debridement Measurements of Total Wound Length: (cm) 0.7 Width: (cm) 1.2 Depth: (cm) 0.4 Volume: (cm) 0.264 Hofbauer, Jeffery L ([id number]) [phone].pdf Page 2 of 10 Character of Wound/Ulcer Post Debridement: Improved Post Procedure Diagnosis Same as Pre-procedure Electronic Signature(s) Signed: 08/08/2023 11:11:18 AM By: [name] MD FACS Signed: 08/08/2023 11:43:25 AM By: Karie Schwalbe RN Entered By: Karie Schwalbe on 08/08/2023 06:32:35 -------------------------------------------------------------------------------- Debridement Details Patient Name: Date of Service: [name] 08/08/2023 8:45 A M Medical Record Number: [id number] Patient Account Number: [id number] Date of Birth/Sex: Treating RN: [birth year] (37 y.o. Dianna Limbo Primary Care Provider: Sanda Linger Other Clinician: Referring Provider: Treating Provider/Extender: Royston Sinner in Treatment: 1 Debridement Performed for Assessment: Wound #2 Left,Proximal,Dorsal Foot Performed By: Physician [name], MD Debridement Type: Debridement Level of Consciousness (Pre-procedure): Awake and Alert Pre-procedure Verification/Time Out Yes - 09:28 Taken: Start Time: 09:28 Pain Control: Lidocaine 4% Topical Solution Percent of Wound Bed Debrided: 100% T Area Debrided (cm): otal 0.03 Tissue and other material debrided: Non-Viable, Eschar, Slough, Slough Level: Non-Viable Tissue Debridement Description: Selective/Open Wound Instrument: Curette Bleeding: Minimum Hemostasis Achieved: Pressure End Time: 09:30 Procedural Pain: 0 Post Procedural Pain: 0 Response to Treatment: Procedure  was tolerated well Level of Consciousness (Post- Awake and Alert procedure): Post Debridement Measurements of Total Wound Length: (cm) 0.2 Width: (cm) 0.2 Depth: (cm) 0.1 Volume: (cm) 0.003 Character of Wound/Ulcer Post Debridement: Improved Post Procedure Diagnosis Same as Pre-procedure Electronic Signature(s) Signed: 08/08/2023 11:11:18 AM By: [name] MD FACS Signed: 08/08/2023 11:43:25 AM By: Karie Schwalbe RN Entered By: Karie Schwalbe on 08/08/2023 06:33:21 HPI Details -------------------------------------------------------------------------------- [name] ([id number]) [id number] Page 3 of 10 Patient Name: Date of Service: [place] Gross 08/08/2023 8:45 A M Medical Record Number: [id number] Patient Account Number: [id number] Date of Birth/Sex: Treating RN: 1986/06/12 (37 y.o. 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 He has been tested for sickle cell disease and does not have it. This has been a recurrent issue for several years now. He has primarily been followed by podiatry. They obtained vascular studies in 2022. ABI results are copied here: +-------+-----------+-----------+------------+------------+ ABI/TBIT oday's ABIT oday's TBIPrevious ABIPrevious TBI +-------+-----------+-----------+------------+------------+ Right 1.22 0.83    +-------+-----------+-----------+------------+------------+ Left 1.26 0.74    +-------+-----------+-----------+------------+------------+ Venous reflux testing demonstrated reflux in the greater saphenous vein in the left proximal thigh, mid thigh, distal thigh, knee, and proximal calf. I do not see that  he was ever referred to vascular surgery to address his venous insufficiency. He last saw podiatry 2 days ago. Apparently they dressed it with Betadine but then recommended that he apply Santyl, which he has been doing. 08/08/2023: He has a new wound on the dorsal aspect of his left foot, proximal to where the existing wound is. There is sloughon the surface, but it does not appear to penetrate beyond the fat layer. The existing wound still has some tunneling and thick slough accumulation on the surface, but once the slough was removed, there appears to be granulation tissue closing over the exposed muscle. He has not received an appointment to meet with vascular surgery regarding his venous reflux. Electronic Signature(s) Signed: 08/08/2023 9:38:17 AM By: [name] MD FACS Entered By: [name] on 08/08/2023 06:38:16 -------------------------------------------------------------------------------- Physical Exam Details Patient Name: Date of Service: MA Cloyd Gross 08/08/2023 8:45 A M Medical Record Number: [id number] Patient Account Number: [id number] Date of Birth/Sex: Treating RN: 1986/05/08 (37 y.o. M) Primary Care Provider: Sanda Linger Other Clinician: Referring Provider: Treating Provider/Extender: Royston Sinner in Treatment: 1 Constitutional Slightly hypertensive. . . . no acute distress. Respiratory Normal work of breathing on room air. Notes 08/08/2023: He has a new wound on the dorsal aspect of his left foot, proximal to where the existing wound is. There is sloughon the surface, but it does not appear to penetrate beyond the fat layer. The existing wound still has some tunneling and thick slough accumulation on the surface, but once the slough was removed, there appears to be granulation tissue closing over the exposed muscle. Electronic Signature(s) Signed: 08/08/2023 9:39:30 AM By: [name] MD FACS Entered By: [name] on 08/08/2023 06:39:30 Physician Orders Details -------------------------------------------------------------------------------- [name] ([id number]) [id number] Page 4 of 10 Patient Name: Date of Service: [place] Gross 08/08/2023 8:45 A M Medical Record Number: [id number] Patient Account Number: [id number] Date of Birth/Sex: Treating RN: 05-30-1986 (37 y.o. 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 - Dr. Lady Gary RM 1 08/14/23 at 8:15am Return A (Please ask front desk to schedule another appointment) Anesthetic Wound #1 Left,Distal,Dorsal Foot (In clinic) Topical Lidocaine 4% applied to wound bed Bathing/ Shower/ Hygiene May shower and wash wound with soap and water. Edema Control - Lymphedema / SCD / Other Elevate  legs to the level of the heart or above for 30 minutes daily and/or when sitting for 3-4 times a day throughout the day. - throughout the day Avoid standing for long periods of time. Exercise regularly Moisturize legs daily. Compression stocking or Garment 20-30 mm/Hg pressure to: - both legs daily Wound Treatment Wound #1 - Foot Wound Laterality: Dorsal, Left, Distal Cleanser: Wound Cleanser 1 x Per Day/30 Days Discharge Instructions: Cleanse the wound with wound cleanser prior to applying a clean dressing using gauze sponges, not tissue or cotton balls. Cleanser: Byram Ancillary Kit - 15 Day Supply 1 x Per Day/30  Days Discharge Instructions: Use supplies as instructed; Kit contains: (15) Saline Bullets; (15) 3x3 Gauze; 15 pr Gloves Prim Dressing: Santyl Ointment 1 x Per Day/30 Days ary Discharge Instructions: Apply nickel thick amount to wound bed as instructed Secondary Dressing: Woven Gauze Sponge, Non-Sterile 4x4 in 1 x Per Day/30 Days Discharge Instructions: Apply over primary dressing as directed. Secured With: [employer], 4.5x3.1 (in/yd) (DME) (Generic) 1 x Per Day/30 Days Discharge Instructions: Secure with Kerlix as directed. Secured With: Paper Tape, 2x10 (in/yd) 1 x Per Day/30 Days Discharge Instructions: Secure dressing with tape as directed. Wound #2 - Foot Wound Laterality: Dorsal, Left, Proximal Cleanser: Wound Cleanser 1 x Per Day/30 Days Discharge Instructions: Cleanse the wound with wound cleanser prior to applying a clean dressing using gauze sponges, not tissue or cotton balls. Cleanser: Byram Ancillary Kit - 15 Day Supply 1 x Per Day/30 Days Discharge Instructions: Use supplies as instructed; Kit contains: (15) Saline Bullets; (15) 3x3 Gauze; 15 pr Gloves Prim Dressing: Santyl Ointment 1 x Per Day/30 Days ary Discharge Instructions: Apply nickel thick amount to wound bed as instructed Secondary Dressing: Woven Gauze Sponge, Non-Sterile 4x4 in 1 x Per Day/30 Days Discharge Instructions: Apply over primary dressing as directed. Secured With: [employer], 4.5x3.1 (in/yd) (DME) (Generic) 1 x Per Day/30 Days Discharge Instructions: Secure with Kerlix as directed. Secured With: Paper Tape, 2x10 (in/yd) 1 x Per Day/30 Days Discharge Instructions: Secure dressing with tape as directed. BAWI, LAKINS ([id number]) [id number] Page 5 of 10 Consults Vascular surgery - Ablation of the Left Lower Leg following positive Reflux Studies. - (ICD10 I87.2 - Venous insufficiency (chronic) (peripheral)) Electronic Signature(s) Signed: 08/08/2023  11:11:18 AM By: [name] MD FACS Signed: 08/08/2023 11:43:25 AM By: Karie Schwalbe RN Entered By: Karie Schwalbe on 08/08/2023 07:08:49 Prescription 08/08/2023 -------------------------------------------------------------------------------- Hefter, Jahson L. [name] MD Patient Name: Provider: 06-03-1986 [id number] Date of Birth: NPI#: Judie Petit [id number] Sex: DEA #: [phone] Phone #: License #: UPN: Patient Address: [address] Eligha Bridegroom [facility] Wound [place], [place] [address] [address] [place], [place] [address] [phone] Allergies lisinopril Provider's Orders Vascular surgery - ICD10: I87.2 - Ablation of the Left Lower Leg following positive Reflux Studies. Hand Signature: Date(s): Electronic Signature(s) Signed: 08/08/2023 11:11:18 AM By: [name] MD FACS Signed: 08/08/2023 11:43:25 AM By: Karie Schwalbe RN Entered By: Karie Schwalbe on 08/08/2023 07:08:50 -------------------------------------------------------------------------------- Problem List Details Patient Name: Date of Service: [name] 08/08/2023 8:45 A M Medical Record Number: [id number] Patient Account Number: [id number] Date of Birth/Sex: Treating RN: 1985-12-28 (37 y.o. 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 07/31/2023 No  Yes without evidence of necrosis JABAR, KRYSIAK ([id number]) [id number] Page 6 of 10 [phone] Non-pressure chronic ulcer of other part of left foot with fat layer exposed 08/08/2023 No Yes I87.2 Venous insufficiency (chronic) (peripheral) 07/31/2023 No Yes Inactive  Problems Resolved Problems Electronic Signature(s) Signed: 08/08/2023 9:35:01 AM By: [name] MD FACS Entered By: [name] on 08/08/2023 06:35:00 -------------------------------------------------------------------------------- Progress Note Details Patient Name: Date of Service: [name] 08/08/2023 8:45 A M Medical Record Number: [id number] Patient Account Number: [id number] Date of Birth/Sex: Treating RN: [birth year] (37 y.o. 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 He has been tested for sickle cell disease and does not have it. This has been a recurrent issue for several years now. He has primarily been followed by podiatry. They obtained vascular studies in 2022. ABI results are copied here: +-------+-----------+-----------+------------+------------+ ABI/TBIT oday's ABIT oday's TBIPrevious ABIPrevious TBI +-------+-----------+-----------+------------+------------+ Right 1.22 0.83    +-------+-----------+-----------+------------+------------+ Left 1.26 0.74    +-------+-----------+-----------+------------+------------+ Venous reflux testing demonstrated reflux in the greater saphenous vein in the left proximal thigh, mid thigh, distal thigh, knee, and proximal calf. I do not see that he was ever referred to vascular surgery to address his venous insufficiency. He last saw podiatry 2 days ago. Apparently they dressed it with Betadine but then recommended that he apply Santyl, which he has been doing. 08/08/2023: He has a new wound on  the dorsal aspect of his left foot, proximal to where the existing wound is. There is sloughon the surface, but it does not appear to penetrate beyond the fat layer. The existing wound still has some tunneling and thick slough accumulation on the surface, but once the slough was removed, there appears to be granulation tissue closing over the exposed muscle. He has not received an appointment to meet with vascular surgery regarding his venous reflux. Patient History Information obtained from Patient, Chart. Family History Hypertension - Mother, No family history of Cancer, Diabetes, Heart Disease, Hereditary Spherocytosis, Kidney Disease, Lung Disease, Seizures, Stroke, Thyroid Problems, Tuberculosis. 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 Medical History Ear/Nose/Mouth/Throat Denies history of Chronic sinus problems/congestion, Middle ear problems Respiratory Patient has history of Asthma Cardiovascular Patient has history of Hypertension, Peripheral Venous Disease Endocrine MANOJ, ENRIQUEZ ([id number]) [id number] Page 7 of 10 Denies history of Type I Diabetes, Type II Diabetes Integumentary (Skin) Denies history of History of Burn Psychiatric Denies history of Anorexia/bulimia, Confinement Anxiety Hospitalization/Surgery History - right shoulder repair. Medical A Surgical History Notes nd Ear/Nose/Mouth/Throat seasonal alllergies Integumentary (Skin) lichen simplex chronicus Objective Constitutional Slightly hypertensive. no acute distress. Vitals Time Taken: 9:05 AM, Height: 68 in, Weight: 207 lbs, BMI: 31.5, Temperature: 98.3 F, Pulse: 75 bpm, Respiratory Rate: 16 breaths/min, Blood Pressure: 142/75 mmHg. Respiratory Normal work of breathing on room air. General Notes: 08/08/2023: He has a new wound on the dorsal aspect of his left  foot, proximal to where the existing wound is. There is sloughon the surface, but it does not appear to penetrate beyond the fat layer. The existing wound still has some tunneling and thick  slough accumulation on the surface, but once the slough was removed, there appears to be granulation tissue closing over the exposed muscle. Integumentary (Hair, Skin) Wound #1 status is Open. Original cause of wound was Gradually Appeared. The date acquired was: 05/19/2023. The wound has been in treatment 1 weeks. The wound is located on the Left,Distal,Dorsal Foot. The wound measures 0.7cm length x 1.2cm width x 0.4cm depth; 0.66cm^2 area and 0.264cm^3 volume. There is muscle and Fat Layer (Subcutaneous Tissue) exposed. Tunneling has been noted at 9:00 with a maximum distance of 0.8cm. Undermining begins at 12:00 and ends at 12:00 with a maximum distance of 0.4cm. There is a medium amount of serous drainage noted. The wound margin is well defined and not attached to the wound base. There is small (1-33%) pink granulation within the wound bed. There is a large (67-100%) amount of necrotic tissue within the wound bed including Adherent Slough. The periwound skin appearance had no abnormalities noted for texture. The periwound skin appearance had no abnormalities noted for moisture. The periwound skin appearance had no abnormalities noted for color. Periwound temperature was noted as No Abnormality. The periwound has tenderness on palpation. Wound #2 status is Open. Original cause of wound was Gradually Appeared. The date acquired was: 08/05/2023. The wound is located on the Left,Proximal,Dorsal Foot. The wound measures 0.2cm length x 0.2cm width x 0.1cm depth; 0.031cm^2 area and 0.003cm^3 volume. There is Fat Layer (Subcutaneous Tissue) exposed. There is no tunneling or undermining noted. There is a medium amount of serosanguineous drainage noted. The wound margin is distinct with the outline attached to the wound  base. There is small (1-33%) red granulation within the wound bed. There is a large (67-100%) amount of necrotic tissue within the wound bed including Eschar and Adherent Slough. The periwound skin appearance had no abnormalities noted for texture. The periwound skin appearance had no abnormalities noted for moisture. The periwound skin appearance had no abnormalities noted for color. Periwound temperature was noted as No Abnormality. 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 There was a Excisional Skin/Subcutaneous Tissue Debridement with a total area of 0.66 sq cm performed by [name], MD. With the following instrument(s): Curette to remove Viable and Non-Viable tissue/material. Material removed includes Subcutaneous Tissue and Slough and after achieving pain control using Lidocaine 4% T opical Solution. No specimens were taken. A time out was conducted at 09:28, prior to the start of the procedure. A Minimum amount of bleeding was controlled with Pressure. The procedure was tolerated well with a pain level of 0 throughout and a pain level of 0 following the procedure. Post Debridement Measurements: 0.7cm length x 1.2cm width x 0.4cm depth; 0.264cm^3 volume. Character of Wound/Ulcer Post Debridement is improved. Post procedure Diagnosis Wound #1: Same as Pre-Procedure JOVONNI, BORQUEZ ([id number]) [phone].pdf Page 8 of 10 Wound #2 Pre-procedure diagnosis of Wound #2 is a Trauma, Other located on the Left,Proximal,Dorsal Foot . There was a Selective/Open Wound Non-Viable Tissue Debridement with a total area of 0.03 sq cm  performed by [name], MD. With the following instrument(s): Curette to remove Non-Viable tissue/material. Material removed includes Eschar and Slough and after achieving pain control using Lidocaine 4% T opical Solution. No specimens were taken.  A time out was conducted at 09:28, prior to the start of the procedure. A Minimum amount of bleeding was controlled with Pressure. The procedure was tolerated well with a pain level of 0 throughout and a pain level of 0 following the procedure. Post Debridement Measurements: 0.2cm length x 0.2cm width x 0.1cm depth; 0.003cm^3 volume. Character of Wound/Ulcer Post Debridement is improved. Post procedure Diagnosis Wound #2: Same as Pre-Procedure Plan Follow-up Appointments: Return Appointment in 1 week. - Dr. Lady Gary RM 1 08/14/23 at 8:15am (Please ask front desk to schedule another appointment) Anesthetic: Wound #1 Left,Distal,Dorsal Foot: (In clinic) Topical Lidocaine 4% applied to wound bed Bathing/ Shower/ Hygiene: May shower and wash wound with soap and water. Edema Control - Lymphedema / SCD / Other: Elevate legs to the level of the heart or above for 30 minutes daily and/or when sitting for 3-4 times a day throughout the day. - throughout the day Avoid standing for long periods of time. Exercise regularly Moisturize legs daily. Compression stocking or Garment 20-30 mm/Hg pressure to: - both legs daily WOUND #1: - Foot Wound Laterality: Dorsal, Left, Distal Cleanser: Wound Cleanser 1 x Per Day/30 Days Discharge Instructions: Cleanse the wound with wound cleanser prior to applying a clean dressing using gauze sponges, not tissue or cotton balls. Cleanser: Byram Ancillary Kit - 15 Day Supply (Generic) 1 x Per Day/30 Days Discharge Instructions: Use supplies as instructed; Kit contains: (15) Saline Bullets; (15) 3x3 Gauze; 15 pr Gloves Prim Dressing: Santyl Ointment 1 x Per Day/30 Days ary Discharge Instructions: Apply nickel thick amount  to wound bed as instructed Secondary Dressing: Woven Gauze Sponge, Non-Sterile 4x4 in (Generic) 1 x Per Day/30 Days Discharge Instructions: Apply over primary dressing as directed. Secured With: [employer], 4.5x3.1 (in/yd) 1 x Per Day/30 Days Discharge Instructions: Secure with Kerlix as directed. Secured With: Paper T ape, 2x10 (in/yd) (Generic) 1 x Per Day/30 Days Discharge Instructions: Secure dressing with tape as directed. WOUND #2: - Foot Wound Laterality: Dorsal, Left, Proximal Cleanser: Wound Cleanser 1 x Per Day/30 Days Discharge Instructions: Cleanse the wound with wound cleanser prior to applying a clean dressing using gauze sponges, not tissue or cotton balls. Cleanser: Byram Ancillary Kit - 15 Day Supply (Generic) 1 x Per Day/30 Days Discharge Instructions: Use supplies as instructed; Kit contains: (15) Saline Bullets; (15) 3x3 Gauze; 15 pr Gloves Prim Dressing: Santyl Ointment 1 x Per Day/30 Days ary Discharge Instructions: Apply nickel thick amount to wound bed as instructed Secondary Dressing: Woven Gauze Sponge, Non-Sterile 4x4 in (Generic) 1 x Per Day/30 Days Discharge Instructions: Apply over primary dressing as directed. Secured With: [employer], 4.5x3.1 (in/yd) 1 x Per Day/30 Days Discharge Instructions: Secure with Kerlix as directed. Secured With: Paper T ape, 2x10 (in/yd) (Generic) 1 x Per Day/30 Days Discharge Instructions: Secure dressing with tape as directed. 08/08/2023: He has a new wound on the dorsal aspect of his left foot, proximal to where the existing wound is. There is sloughon the surface, but it does not appear to penetrate beyond the fat layer. The existing wound still has some tunneling and thick slough accumulation on the surface, but once the slough was removed, there appears to be granulation tissue closing over the exposed muscle. He has not received an appointment to meet with vascular surgery regarding his venous reflux. I used a  curette to debride slough from the new wound and slough and subcutaneous tissue from the existing wound. Both of these will benefit from  ongoing enzymatic debridement so we will continue Santyl on both. We will follow-up on the status of his vascular referral. Follow-up in 1 week. Electronic Signature(s) Signed: 08/08/2023 9:41:32 AM By: [name] MD FACS Entered By: [name] on 08/08/2023 06:41:32 -------------------------------------------------------------------------------- HxROS Details Patient Name: Date of Service: [name] 08/08/2023 8:45 A Sheron Nightingale, Tanna Savoy ([id number]) [id number] Page 9 of 10 Medical Record Number: [id number] Patient Account Number: [id number] Date of Birth/Sex: Treating RN: [birth year] (37 y.o. M) Primary Care Provider: Sanda Linger Other Clinician: Referring Provider: Treating Provider/Extender: Royston Sinner in Treatment: 1 Information Obtained From Patient Chart Ear/Nose/Mouth/Throat Medical History: Negative for: Chronic sinus problems/congestion; Middle ear problems Past Medical History Notes: seasonal alllergies Respiratory Medical History: Positive for: Asthma Cardiovascular Medical History: Positive for: Hypertension; Peripheral Venous Disease Endocrine Medical History: Negative for: Type I Diabetes; Type II Diabetes Integumentary (Skin) Medical History: Negative for: History of Burn Past Medical History Notes: lichen simplex chronicus Psychiatric Medical History: Negative for: Anorexia/bulimia; Confinement Anxiety Immunizations Pneumococcal Vaccine: Received Pneumococcal Vaccination: No Implantable Devices No devices added Hospitalization / Surgery History Type of Hospitalization/Surgery right shoulder repair Family and Social History Cancer: No; Diabetes: No; Heart Disease: No; Hereditary Spherocytosis: No; Hypertension: Yes - Mother; Kidney  Disease: No; Lung Disease: No; Seizures: No; Stroke: No; Thyroid Problems: No; Tuberculosis: No; Current every day smoker; Marital Status - Single; Alcohol Use: Rarely; Drug Use: Current History - TCH; Caffeine Use: Rarely; Financial Concerns: No; Food, Clothing or Shelter Needs: No; Support System Lacking: No; Transportation Concerns: No Electronic Signature(s) Signed: 08/08/2023 11:11:18 AM By: [name] MD FACS Entered By: [name] on 08/08/2023 06:38:56 -------------------------------------------------------------------------------- SuperBill Details Patient Name: Date of Service: MA Cloyd Gross. 08/08/2023 Medical Record Number: [id number] Patient Account Number: [id number] MARSHA, HILLMAN ([id number]) [id number] Page 10 of 10 Date of Birth/Sex: Treating RN: 01/16/1986 (37 y.o. 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 I87.2 Venous insufficiency (chronic) (peripheral) Facility Procedures : CPT4 Code: [id number] 1 Description: 1042 - DEB SUBQ TISSUE 20 SQ CM/< ICD-10 Diagnosis Description L97.525 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 wit Modifier: hout evidence of ne Quantity: 1 crosis : CPT4 Code: [id number] 9 Description: 7597 - DEBRIDE WOUND 1ST 20 SQ CM OR < ICD-10 Diagnosis Description L97.522 Non-pressure chronic ulcer of other part of left foot with fat layer exposed Modifier: Quantity: 1 Physician Procedures : CPT4 Code Description Modifier [id number] 99214 - WC PHYS LEVEL 4 - EST PT 25 ICD-10 Diagnosis Description L97.525  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 without evidence of nec L97.522 Non-pressure chronic ulcer of  other part of left foot with fat layer exposed I87.2 Venous insufficiency (chronic) (peripheral) Quantity: 1 rosis : [id number] 11042 - WC PHYS SUBQ TISS 20 SQ CM ICD-10 Diagnosis Description L97.525 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

## 2023-08-14 ENCOUNTER — Encounter (HOSPITAL_BASED_OUTPATIENT_CLINIC_OR_DEPARTMENT_OTHER): Payer: BC Managed Care – PPO | Admitting: General Surgery

## 2023-08-14 DIAGNOSIS — L97525 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 without evidence of necrosis: Secondary | ICD-10-CM | POA: Diagnosis not present

## 2023-08-14 NOTE — Progress Notes (Signed)
Luke Gross, Luke Gross ([id number]) [id number] Page 1 of 10 Visit Report for 08/14/2023 Arrival Information Details Patient Name: Date of Service: Luke Gross 08/14/2023 8:15 A M Medical Record Number: [id number] Patient Account Number: [id number] Date of Birth/Sex: Treating RN: Jun 14, 1986 (37 y.o. Bayard Hugger, [name] Primary Care Ethylene Reznick: Sanda Linger Other Clinician: Referring Lashika Erker: Treating Ethel Meisenheimer/Extender: Royston Sinner in Treatment: 2 Visit Information History Since Last Visit Added or deleted any medications: No Patient Arrived: Ambulatory Any new allergies or adverse reactions: No Arrival Time: 08:40 Had a fall or experienced change in No Accompanied By: self activities of daily living that may affect Transfer Assistance: None risk of falls: Patient Identification Verified: Yes Signs or symptoms of abuse/neglect since last visito No Secondary Verification Process Completed: Yes Hospitalized since last visit: No Patient Requires Transmission-Based Precautions: No Implantable device outside of the clinic excluding No Patient Has Alerts: No cellular tissue based products placed in the center since last visit: Has Dressing in Place as Prescribed: Yes Pain Present Now: No Electronic Signature(s) Signed: 08/14/2023 3:36:53 PM By: Zenaida Deed RN, BSN Entered By: Zenaida Luke Gross on 08/14/2023 05:40:44 -------------------------------------------------------------------------------- Encounter Discharge Information Details Patient Name: Date of Service: MA Luke Gross PHER L. 08/14/2023 8:15 A M Medical Record Number: [id number] Patient Account Number: [id number] Date of Birth/Sex: Treating RN: 03/05/1986 (37 y.o. Luke Gross Primary Care Nayvie Lips: Sanda Linger Other Clinician: Referring Collin Hendley: Treating Eshaal Duby/Extender: Royston Sinner in Treatment: 2 Encounter  Discharge Information Items Post Procedure Vitals Discharge Condition: Stable Temperature (F): 98.2 Ambulatory Status: Ambulatory Pulse (bpm): 70 Discharge Destination: Home Respiratory Rate (breaths/min): 18 Transportation: Private Auto Blood Pressure (mmHg): 141/87 Accompanied By: self Schedule Follow-up Appointment: Yes Clinical Summary of Care: Patient Declined Electronic Signature(s) Signed: 08/14/2023 3:36:53 PM By: Zenaida Deed RN, BSN Entered By: Zenaida Luke Gross on 08/14/2023 06:18:50 [name] ([id number]) [id number] Page 2 of 10 -------------------------------------------------------------------------------- Lower Extremity Assessment Details Patient Name: Date of Service: Luke Gross 08/14/2023 8:15 A M Medical Record Number: [id number] Patient Account Number: [id number] Date of Birth/Sex: Treating RN: 1986-04-12 (37 y.o. Luke Gross Primary Care Hira Trent: Sanda Linger Other Clinician: Referring Dayden Viverette: Treating Claudell Wohler/Extender: Royston Sinner in Treatment: 2 Edema Assessment Assessed: [name]: No] [name]: No] Edema: [Left: N] [Right: o] Calf Left: Right: Point of Measurement: From Medial Instep 39 cm Ankle Left: Right: Point of Measurement: From Medial Instep 24 cm Vascular Assessment Pulses: Dorsalis Pedis Palpable: [Left:Yes] Extremity colors, hair growth, and conditions: Extremity Color: [Left:Normal] Hair Growth on Extremity: [Left:Yes] Temperature of Extremity: [Left:Warm] Capillary Refill: [Left:< 3 seconds] Dependent Rubor: [Left:No No] Electronic Signature(s) Signed: 08/14/2023 3:36:53 PM By: Zenaida Deed RN, BSN Entered By: Zenaida Luke Gross on 08/14/2023 05:47:17 -------------------------------------------------------------------------------- Multi Wound Chart Details Patient Name: Date of Service: MA Luke Gross PHER L. 08/14/2023 8:15 A M Medical Record  Number: [id number] Patient Account Number: [id number] Date of Birth/Sex: Treating RN: [birth year] (37 y.o. M) Primary Care Miko Sirico: Sanda Linger Other Clinician: Referring Roxie Kreeger: Treating [name]/Extender: Royston Sinner in Treatment: 2 Vital Signs Height(in): 68 Pulse(bpm): 70 Weight(lbs): 207 Blood Pressure(mmHg): 141/87 Body Mass Index(BMI): 31.5 Temperature(F): 98.2 Respiratory Rate(breaths/min): 18 [1:Photos:] [N/A:N/A [id number] Page 3 of 10] Left, Distal, Dorsal Foot Left, Proximal, Dorsal Foot N/A Wound Location: Gradually Appeared Gradually Appeared N/A Wounding Event: Atypical Venous Leg Ulcer N/A Primary Etiology: Asthma, Hypertension, Peripheral Asthma, Hypertension, Peripheral N/A Comorbid History: Venous Disease Venous Disease 05/19/2023 08/05/2023 N/A Date Acquired: 2 0 N/A  Weeks of Treatment: Open Open N/A Wound Status: No No N/A Wound Recurrence: 0.6x1.8x0.5 0.3x0.4x0.1 N/A Measurements L x W x D (cm) 0.848 0.094 N/A A (cm) : rea 0.424 0.009 N/A Volume (cm) : -8.00% -203.20% N/A % Reduction in A rea: -7.90% -200.00% N/A % Reduction in Volume: Full Thickness With Exposed Support Full Thickness Without Exposed N/A Classification: Structures Support Structures Medium None Present N/A Exudate A mount: Serous N/A N/A Exudate Type: amber N/A N/A Exudate Color: Well defined, not attached Distinct, outline attached N/A Wound Margin: Medium (34-66%) None Present (0%) N/A Granulation A mount: Pink N/A N/A Granulation Quality: Medium (34-66%) None Present (0%) N/A Necrotic A mount: Fat Layer (Subcutaneous Tissue): Yes Fat Layer (Subcutaneous Tissue): Yes N/A Exposed Structures: Muscle: Yes Fascia: No Tendon: No Joint: No Bone: No Small (1-33%) Large (67-100%) N/A Epithelialization: Debridement - Selective/Open Wound Debridement - Selective/Open Wound N/A Debridement: Pre-procedure  Verification/Time Out 09:00 09:00 N/A Taken: Lidocaine 4% Topical Solution Lidocaine 4% Topical Solution N/A Pain Control: [employer] N/A Tissue Debrided: Non-Viable Tissue Non-Viable Tissue N/A Level: 0.85 0.09 N/A Debridement A (sq cm): rea Curette Curette N/A Instrument: Minimum Minimum N/A Bleeding: Pressure Pressure N/A Hemostasis A chieved: 8 8 N/A Procedural Pain: 5 5 N/A Post Procedural Pain: Procedure was not tolerated well Procedure was not tolerated well N/A Debridement Treatment Response: intense pain intense pain N/A Response Comments: 0.6x1.8x0.5 0.3x0.4x0.2 N/A Post Debridement Measurements L x W x D (cm) 0.424 0.019 N/A Post Debridement Volume: (cm) No Abnormalities Noted No Abnormalities Noted N/A Periwound Skin Texture: No Abnormalities Noted No Abnormalities Noted N/A Periwound Skin Moisture: No Abnormalities Noted No Abnormalities Noted N/A Periwound Skin Color: No Abnormality No Abnormality N/A Temperature: Yes N/A N/A Tenderness on Palpation: Debridement Debridement N/A Procedures Performed: Treatment Notes Wound #1 (Foot) Wound Laterality: Dorsal, Left, Distal Cleanser Wound Cleanser Discharge Instruction: Cleanse the wound with wound cleanser prior to applying a clean dressing using gauze sponges, not tissue or cotton balls. Byram Ancillary Kit - 15 Day Supply Discharge Instruction: Use supplies as instructed; Kit contains: (15) Saline Bullets; (15) 3x3 Gauze; 15 pr Gloves Peri-Wound Care Topical Primary Dressing Santyl Ointment Discharge Instruction: Apply nickel thick amount to wound bed as instructed Secondary Dressing Woven Gauze Sponge, Non-Sterile 4x4 in Discharge Instruction: Apply over primary dressing as directed. TYRONN, GOLDA ([id number]) [id number] Page 4 of 10 Secured With [employer] 4x5 (in/yd) Discharge Instruction: Secure with Coban as directed. Kerlix Roll Sterile,  4.5x3.1 (in/yd) Discharge Instruction: Secure with Kerlix as directed. Paper Tape, 2x10 (in/yd) Discharge Instruction: Secure dressing with tape as directed. Compression Wrap Compression Stockings Add-Ons Wound #2 (Foot) Wound Laterality: Dorsal, Left, Proximal Cleanser Wound Cleanser Discharge Instruction: Cleanse the wound with wound cleanser prior to applying a clean dressing using gauze sponges, not tissue or cotton balls. Byram Ancillary Kit - 15 Day Supply Discharge Instruction: Use supplies as instructed; Kit contains: (15) Saline Bullets; (15) 3x3 Gauze; 15 pr Gloves Peri-Wound Care Topical Primary Dressing Santyl Ointment Discharge Instruction: Apply nickel thick amount to wound bed as instructed Secondary Dressing Woven Gauze Sponge, Non-Sterile 4x4 in Discharge Instruction: Apply over primary dressing as directed. Secured With [employer] 4x5 (in/yd) Discharge Instruction: Secure with Coban as directed. Kerlix Roll Sterile, 4.5x3.1 (in/yd) Discharge Instruction: Secure with Kerlix as directed. Paper Tape, 2x10 (in/yd) Discharge Instruction: Secure dressing with tape as directed. Compression Wrap Compression Stockings Add-Ons Electronic Signature(s) Signed: 08/14/2023 9:42:44 AM By: [name] MD FACS Entered By: [name] on  08/14/2023 06:42:44 -------------------------------------------------------------------------------- Multi-Disciplinary Care Plan Details Patient Name: Date of Service: Luke Gross 08/14/2023 8:15 A M Medical Record Number: [id number] Patient Account Number: [id number] Date of Birth/Sex: Treating RN: 08-04-1986 (37 y.o. Luke Gross Primary Care Cydnie Deason: Sanda Linger Other Clinician: Referring Destani Wamser: Treating Jaedyn Lard/Extender: Royston Sinner in Treatment: 2 Multidisciplinary Care Plan reviewed with physician DONELL, TOMKINS ([id number])  [id number] Page 5 of 10 Active Inactive Wound/Skin Impairment Nursing Diagnoses: Impaired tissue integrity Knowledge deficit related to ulceration/compromised skin integrity Goals: Patient/caregiver will verbalize understanding of skin care regimen Date Initiated: 07/31/2023 Target Resolution Date: 09/28/2023 Goal Status: Active Ulcer/skin breakdown will have a volume reduction of 30% by week 4 Date Initiated: 07/31/2023 Target Resolution Date: 09/28/2023 Goal Status: Active Interventions: Assess patient/caregiver ability to obtain necessary supplies Assess patient/caregiver ability to perform ulcer/skin care regimen upon admission and as needed Assess ulceration(s) every visit Provide education on smoking Provide education on ulcer and skin care Treatment Activities: Referred to DME Dahiana Kulak for dressing supplies : 07/31/2023 Skin care regimen initiated : 07/31/2023 Topical wound management initiated : 07/31/2023 Notes: Electronic Signature(s) Signed: 08/14/2023 3:36:53 PM By: Zenaida Deed RN, BSN Entered By: Zenaida Luke Gross on 08/14/2023 05:52:47 -------------------------------------------------------------------------------- Pain Assessment Details Patient Name: Date of Service: MA Luke Gross PHER L. 08/14/2023 8:15 A M Medical Record Number: [id number] Patient Account Number: [id number] Date of Birth/Sex: Treating RN: 04-30-1986 (37 y.o. Luke Gross Primary Care Ilir Mahrt: Sanda Linger Other Clinician: Referring Terianne Thaker: Treating Jaiveon Suppes/Extender: Royston Sinner in Treatment: 2 Active Problems Location of Pain Severity and Description of Pain Patient Has Paino No Site Locations With Dressing Change: Yes Duration of the Pain. Constant / Intermittento Intermittent Rate the pain. Current Pain Level: 0 Worst Pain Level: 2 Least Pain Level: 0 WILLMER, FELLERS L ([id number])  [phone].pdf Page 6 of 10 Character of Pain Describe the Pain: Sharp Pain Management and Medication Current Pain Management: Is the Current Pain Management Adequate: Adequate How does your wound impact your activities of daily livingo Sleep: No Bathing: No Appetite: No Relationship With Others: No Bladder Continence: No Emotions: No Bowel Continence: No Work: No Toileting: No Drive: No Dressing: No Hobbies: No Psychologist, prison and probation services) Signed: 08/14/2023 3:36:53 PM By: Zenaida Deed RN, BSN Entered By: Zenaida Luke Gross on 08/14/2023 05:44:11 -------------------------------------------------------------------------------- Patient/Caregiver Education Details Patient Name: Date of Service: MA Cloyd Gross 10/10/2024andnbsp8:15 A M Medical Record Number: [id number] Patient Account Number: [id number] Date of Birth/Gender: Treating RN: [birth year] (37 y.o. Luke Gross Primary Care Physician: Sanda Linger Other Clinician: Referring Physician: Treating Physician/Extender: Royston Sinner in Treatment: 2 Education Assessment Education Provided To: Patient Education Topics Provided Wound/Skin Impairment: Methods: Explain/Verbal Responses: Reinforcements needed, State content correctly Electronic Signature(s) Signed: 08/14/2023 3:36:53 PM By: Zenaida Deed RN, BSN Entered By: Zenaida Luke Gross on 08/14/2023 05:53:04 -------------------------------------------------------------------------------- Wound Assessment Details Patient Name: Date of Service: MA Cloyd Gross 08/14/2023 8:15 A M Medical Record Number: [id number] Patient Account Number: [id number] Date of Birth/Sex: Treating RN: 08/06/1986 (37 y.o. Luke Gross Primary Care Jerson Furukawa: Sanda Linger Other Clinician: Referring Deakon Frix: Treating Alaya Iverson/Extender: Royston Sinner in Treatment: 2 Wound Status Wound Number:  1 Primary Etiology: Atypical Wound Location: Left, Distal, Dorsal Foot Wound Status: Open PACO, CISLO ([id number]) [phone].pdf Page 7 of 10 Wounding Event: Gradually Appeared Comorbid History: Asthma, Hypertension, Peripheral Venous Disease Date Acquired: 05/19/2023 Weeks Of Treatment: 2 Clustered Wound: No Photos Wound Measurements Length: (cm) 0.6 Width: (cm)  1.8 Depth: (cm) 0.5 Area: (cm) 0.848 Volume: (cm) 0.424 % Reduction in Area: -8% % Reduction in Volume: -7.9% Epithelialization: Small (1-33%) Tunneling: No Undermining: No Wound Description Classification: Full Thickness With Exposed Support Structures Wound Margin: Well defined, not attached Exudate Amount: Medium Exudate Type: Serous Exudate Color: amber Foul Odor After Cleansing: No Slough/Fibrino Yes Wound Bed Granulation Amount: Medium (34-66%) Exposed Structure Granulation Quality: Pink Fascia Exposed: No Necrotic Amount: Medium (34-66%) Fat Layer (Subcutaneous Tissue) Exposed: Yes Necrotic Quality: Adherent Slough Tendon Exposed: No Muscle Exposed: Yes Necrosis of Muscle: No Joint Exposed: No Bone Exposed: No Periwound Skin Texture Texture Color No Abnormalities Noted: Yes No Abnormalities Noted: Yes Moisture Temperature / Pain No Abnormalities Noted: Yes Temperature: No Abnormality Tenderness on Palpation: Yes Treatment Notes Wound #1 (Foot) Wound Laterality: Dorsal, Left, Distal Cleanser Wound Cleanser Discharge Instruction: Cleanse the wound with wound cleanser prior to applying a clean dressing using gauze sponges, not tissue or cotton balls. Byram Ancillary Kit - 15 Day Supply Discharge Instruction: Use supplies as instructed; Kit contains: (15) Saline Bullets; (15) 3x3 Gauze; 15 pr Gloves Peri-Wound Care Topical Primary Dressing Santyl Ointment Discharge Instruction: Apply nickel thick amount to wound bed as instructed Secondary Dressing Woven Gauze  Sponge, Non-Sterile 4x4 in Discharge Instruction: Apply over primary dressing as directed. Secured With JEMARIO, POITRAS ([id number]) [id number] Page 8 of 10 Coban Self-Adherent Wrap 4x5 (in/yd) Discharge Instruction: Secure with Coban as directed. Kerlix Roll Sterile, 4.5x3.1 (in/yd) Discharge Instruction: Secure with Kerlix as directed. Paper Tape, 2x10 (in/yd) Discharge Instruction: Secure dressing with tape as directed. Compression Wrap Compression Stockings Add-Ons Electronic Signature(s) Signed: 08/14/2023 3:36:53 PM By: Zenaida Deed RN, BSN Entered By: Zenaida Luke Gross on 08/14/2023 05:52:03 -------------------------------------------------------------------------------- Wound Assessment Details Patient Name: Date of Service: MA Cloyd Gross 08/14/2023 8:15 A M Medical Record Number: [id number] Patient Account Number: [id number] Date of Birth/Sex: Treating RN: 18-Mar-1986 (37 y.o. Luke Gross Primary Care Cayleigh Paull: Sanda Linger Other Clinician: Referring Shalea Tomczak: Treating Deniese Oberry/Extender: Royston Sinner in Treatment: 2 Wound Status Wound Number: 2 Primary Etiology: Venous Leg Ulcer Wound Location: Left, Proximal, Dorsal Foot Wound Status: Open Wounding Event: Gradually Appeared Comorbid History: Asthma, Hypertension, Peripheral Venous Disease Date Acquired: 08/05/2023 Weeks Of Treatment: 0 Clustered Wound: No Photos Wound Measurements Length: (cm) 0.3 Width: (cm) 0.4 Depth: (cm) 0.1 Area: (cm) 0.094 Volume: (cm) 0.009 % Reduction in Area: -203.2% % Reduction in Volume: -200% Epithelialization: Large (67-100%) Tunneling: No Undermining: No Wound Description Classification: Full Thickness Without Exposed Support Wound Margin: Distinct, outline attached Exudate Amount: None Present Structures Foul Odor After Cleansing: No Slough/Fibrino Yes Wound Bed Granulation Amount: None Present  (0%) Exposed Structure Necrotic Amount: None Present (0%) Fat Layer (Subcutaneous Tissue) Exposed: Yes Periwound Skin Texture SLADEN, PLANCARTE L ([id number]) [phone].pdf Page 9 of 10 Texture Color No Abnormalities Noted: Yes No Abnormalities Noted: Yes Moisture Temperature / Pain No Abnormalities Noted: Yes Temperature: No Abnormality Treatment Notes Wound #2 (Foot) Wound Laterality: Dorsal, Left, Proximal Cleanser Wound Cleanser Discharge Instruction: Cleanse the wound with wound cleanser prior to applying a clean dressing using gauze sponges, not tissue or cotton balls. Byram Ancillary Kit - 15 Day Supply Discharge Instruction: Use supplies as instructed; Kit contains: (15) Saline Bullets; (15) 3x3 Gauze; 15 pr Gloves Peri-Wound Care Topical Primary Dressing Santyl Ointment Discharge Instruction: Apply nickel thick amount to wound bed as instructed Secondary Dressing Woven Gauze Sponge, Non-Sterile 4x4 in Discharge Instruction: Apply over primary dressing as directed. Secured With [employer]  Self-Adherent Wrap 4x5 (in/yd) Discharge Instruction: Secure with Coban as directed. Kerlix Roll Sterile, 4.5x3.1 (in/yd) Discharge Instruction: Secure with Kerlix as directed. Paper Tape, 2x10 (in/yd) Discharge Instruction: Secure dressing with tape as directed. Compression Wrap Compression Stockings Add-Ons Electronic Signature(s) Signed: 08/14/2023 3:36:53 PM By: Zenaida Deed RN, BSN Entered By: Zenaida Luke Gross on 08/14/2023 05:52:36 -------------------------------------------------------------------------------- Vitals Details Patient Name: Date of Service: MA Luke Gross PHER L. 08/14/2023 8:15 A M Medical Record Number: [id number] Patient Account Number: [id number] Date of Birth/Sex: Treating RN: May 27, 1986 (37 y.o. Luke Gross Primary Care Jabir Dahlem: Sanda Linger Other Clinician: Referring Leman Martinek: Treating Johnesha Acheampong/Extender: Royston Sinner in Treatment: 2 Vital Signs Time Taken: 08:40 Temperature (F): 98.2 Height (in): 68 Pulse (bpm): 70 Weight (lbs): 207 Respiratory Rate (breaths/min): 18 Body Mass Index (BMI): 31.5 Blood Pressure (mmHg): 141/87 Reference Range: 80 - 120 mg / dl Electronic Signature(s) Signed: 08/14/2023 3:36:53 PM By: Zenaida Deed RN, BSN Entered By: Zenaida Luke Gross on 08/14/2023 05:43:14 [name] ([id number]) [id number] Page 10 of 10

## 2023-08-14 NOTE — Progress Notes (Signed)
ORVEL, C[place]SFORTH ([id number]) [id number] Page 1 of 10 Visit Report for 08/14/2023 Chief Complaint Document Details Patient Name: Date of Service: [name] 08/14/2023 8:15 A M Medical Record Number: [id number] Patient Account Number: [id number] Date of Birth/Sex: Treating RN: 02-Jan-1986 (37 y.o. M) Primary Care Provider: Sanda Linger Other Clinician: Referring Provider: Treating Provider/Extender: Royston Sinner in Treatment: 2 Information Obtained from: Patient Chief Complaint Patient seen for complaints of Non-Healing Wound on foot, in setting of venous insufficiency Electronic Signature(s) Signed: 08/14/2023 9:42:51 AM By: [name] MD FACS Entered By: [name] on 08/14/2023 06:42:50 -------------------------------------------------------------------------------- Debridement Details Patient Name: Date of Service: Luke [name] PHER Gross. 08/14/2023 8:15 A M Medical Record Number: [id number] Patient Account Number: [id number] Date of Birth/Sex: Treating RN: [birth year] (37 y.o. [name] Primary Care Provider: Sanda Linger Other Clinician: Referring Provider: Treating Provider/Extender: Royston Sinner in Treatment: 2 Debridement Performed for Assessment: Wound #2 Left,Proximal,Dorsal Foot Performed By: Physician [name], MD The following information was scribed by: Zenaida Deed The information was scribed for: [name] Debridement Type: Debridement Severity of Tissue Pre Debridement: Fat layer exposed Level of Consciousness (Pre-procedure): Awake and Alert Pre-procedure Verification/Time Out Yes - 09:00 Taken: Start Time: 09:03 Pain Control: Lidocaine 4% T opical Solution Percent of Wound Bed Debrided: 100% T Area Debrided (cm): otal 0.09 Tissue and other material debrided: Non-Viable, Slough, Slough Level: Non-Viable  Tissue Debridement Description: Selective/Open Wound Instrument: Curette Bleeding: Minimum Hemostasis Achieved: Pressure Procedural Pain: 8 Post Procedural Pain: 5 Response to Treatment: Procedure was not tolerated well Comments: intense pain Level of Consciousness (Post- Awake and Alert procedure): Post Debridement Measurements of Total Wound Length: (cm) 0.3 Width: (cm) 0.4 Depth: (cm) 0.2 Luke Gross, Luke Gross ([id number]) [id number] Page 2 of 10 Volume: (cm) 0.019 Character of Wound/Ulcer Post Debridement: Improved Severity of Tissue Post Debridement: Fat layer exposed Post Procedure Diagnosis Same as Pre-procedure Electronic Signature(s) Signed: 08/14/2023 1:10:39 PM By: [name] MD FACS Signed: 08/14/2023 3:36:53 PM By: Zenaida Deed RN, BSN Entered By: Zenaida Deed on 08/14/2023 06:07:00 -------------------------------------------------------------------------------- Debridement Details Patient Name: Date of Service: Luke [name] PHER Gross. 08/14/2023 8:15 A M Medical Record Number: [id number] Patient Account Number: [id number] Date of Birth/Sex: Treating RN: [birth year] (37 y.o. [name] Primary Care Provider: Sanda Linger Other Clinician: Referring Provider: Treating Provider/Extender: Royston Sinner in Treatment: 2 Debridement Performed for Assessment: Wound #1 Left,Distal,Dorsal Foot Performed By: Physician [name], MD The following information was scribed by: Zenaida Deed The information was scribed for: [name] Debridement Type: Debridement Level of Consciousness (Pre-procedure): Awake and Alert Pre-procedure Verification/Time Out Yes - 09:00 Taken: Start Time: 09:03 Pain Control: Lidocaine 4% T opical Solution Percent of Wound Bed Debrided: 100% T Area Debrided (cm): otal 0.85 Tissue and other material debrided: Non-Viable, Slough, Slough Level: Non-Viable  Tissue Debridement Description: Selective/Open Wound Instrument: Curette Bleeding: Minimum Hemostasis Achieved: Pressure Procedural Pain: 8 Post Procedural Pain: 5 Response to Treatment: Procedure was not tolerated well Comments: intense pain Level of Consciousness (Post- Awake and Alert procedure): Post Debridement Measurements of Total Wound Length: (cm) 0.6 Width: (cm) 1.8 Depth: (cm) 0.5 Volume: (cm) 0.424 Character of Wound/Ulcer Post Debridement: Improved Post Procedure Diagnosis Same as Pre-procedure Electronic Signature(s) Signed: 08/14/2023 1:10:39 PM By: [name] MD FACS Signed: 08/14/2023 3:36:53 PM By: Zenaida Deed RN, BSN Entered By: Zenaida Deed on 08/14/2023 06:07:40 Luke Gross, Luke Gross ([id number]) [id number] Page 3 of 10 -------------------------------------------------------------------------------- HPI Details  Patient Name: Date of Service: [name] 08/14/2023 8:15 A M Medical Record Number: [id number] Patient Account Number: [id number] Date of Birth/Sex: Treating RN: [birth year] (37 y.o. 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 He has been tested for sickle cell disease and does not have it. This has been a recurrent issue for several years now. He has primarily been followed by podiatry. They obtained vascular studies in 2022. ABI results are copied here: +-------+-----------+-----------+------------+------------+ ABI/TBIT oday's ABIT oday's TBIPrevious ABIPrevious TBI +-------+-----------+-----------+------------+------------+ Right 1.22 0.83    +-------+-----------+-----------+------------+------------+ Left 1.26 0.74     +-------+-----------+-----------+------------+------------+ Venous reflux testing demonstrated reflux in the greater saphenous vein in the left proximal thigh, mid thigh, distal thigh, knee, and proximal calf. I do not see that he was ever referred to vascular surgery to address his venous insufficiency. He last saw podiatry 2 days ago. Apparently they dressed it with Betadine but then recommended that he apply Santyl, which he has been doing. 08/08/2023: He has a new wound on the dorsal aspect of his left foot, proximal to where the existing wound is. There is slough on the surface, but it does not appear to penetrate beyond the fat layer. The existing wound still has some tunneling and thick slough accumulation on the surface, but once the slough was removed, there appears to be granulation tissue closing over the exposed muscle. He has not received an appointment to meet with vascular surgery regarding his venous reflux. 08/14/2023: The dorsal foot wound is a little bit larger today. The more distal foot wound is about the same size. Both have significant slough on their surfaces. Electronic Signature(s) Signed: 08/14/2023 9:43:39 AM By: [name] MD FACS Entered By: [name] on 08/14/2023 06:43:39 -------------------------------------------------------------------------------- Physical Exam Details Patient Name: Date of Service: Luke Gross 08/14/2023 8:15 A M Medical Record Number: [id number] Patient Account Number: [id number] Date of Birth/Sex: Treating RN: Jul 16, 1986 (37 y.o. M) Primary Care Provider: Sanda Linger Other Clinician: Referring Provider: Treating Provider/Extender: Royston Sinner in Treatment: 2 Constitutional Slightly hypertensive. . . . no acute distress. Respiratory Normal work of breathing on room air. Notes 08/14/2023: The dorsal foot wound is a little bit larger today. The more distal foot wound is about  the same size. Both have significant slough on their surfaces. Electronic Signature(s) Signed: 08/14/2023 9:44:08 AM By: [name] MD FACS Entered By: [name] on 08/14/2023 06:44:07 Luke Gross, Luke Gross ([id number]) [id number] Page 4 of 10 -------------------------------------------------------------------------------- Physician Orders Details Patient Name: Date of Service: [name] 08/14/2023 8:15 A M Medical Record Number: [id number] Patient Account Number: [id number] Date of Birth/Sex: Treating RN: [birth year] (37 y.o. 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 - Dr. Lady Gary RM 3 Return A 08/22/23 at 8:30 am Anesthetic Wound #1 Left,Distal,Dorsal  Foot (In clinic) Topical Lidocaine 4% applied to wound bed Bathing/ Shower/ Hygiene May shower and wash wound with soap and water. Edema Control - Lymphedema / SCD / Other Elevate legs to the level of the heart or above for 30 minutes daily and/or when sitting for 3-4 times a day throughout the day. - throughout the day Avoid standing for long periods of time. Exercise regularly Moisturize legs daily. Compression stocking or Garment 20-30 mm/Hg pressure to: - both legs daily Wound Treatment Wound #1 - Foot Wound Laterality: Dorsal, Left, Distal Cleanser: Wound Cleanser 1 x  Per Day/30 Days Discharge Instructions: Cleanse the wound with wound cleanser prior to applying a clean dressing using gauze sponges, not tissue or cotton balls. Cleanser: Byram Ancillary Kit - 15 Day Supply 1 x Per Day/30 Days Discharge Instructions: Use supplies as instructed; Kit contains: (15) Saline Bullets; (15) 3x3 Gauze; 15 pr Gloves Prim Dressing: Santyl Ointment 1 x Per Day/30 Days ary Discharge Instructions: Apply nickel thick amount to wound bed as instructed Secondary Dressing: Woven Gauze Sponge, Non-Sterile 4x4 in 1 x Per Day/30 Days Discharge Instructions: Apply over primary dressing as directed. Secured With: Coban Self-Adherent Wrap 4x5 (in/yd) 1 x Per Day/30 Days Discharge Instructions: Secure with Coban as directed. Secured With: [employer], 4.5x3.1 (in/yd) (Generic) 1 x Per Day/30 Days Discharge Instructions: Secure with Kerlix as directed. Secured With: Paper Tape, 2x10 (in/yd) 1 x Per Day/30 Days Discharge Instructions: Secure dressing with tape as directed. Wound #2 - Foot Wound Laterality: Dorsal, Left, Proximal Cleanser: Wound Cleanser 1 x Per Day/30 Days Discharge Instructions: Cleanse the wound with wound cleanser prior to applying a clean dressing using gauze sponges, not tissue or cotton balls. Cleanser: Byram Ancillary Kit - 15 Day Supply 1 x Per Day/30 Days Discharge Instructions: Use supplies as instructed; Kit contains: (15) Saline Bullets; (15) 3x3 Gauze; 15 pr Gloves Luke Gross, Luke Gross ([id number]) [id number] Page 5 of 10 Prim Dressing: Santyl Ointment 1 x Per Day/30 Days ary Discharge Instructions: Apply nickel thick amount to wound bed as instructed Secondary Dressing: Woven Gauze Sponge, Non-Sterile 4x4 in 1 x Per Day/30 Days Discharge Instructions: Apply over primary dressing as directed. Secured With: Coban Self-Adherent Wrap 4x5 (in/yd) 1 x Per Day/30 Days Discharge Instructions: Secure with Coban as  directed. Secured With: [employer], 4.5x3.1 (in/yd) (Generic) 1 x Per Day/30 Days Discharge Instructions: Secure with Kerlix as directed. Secured With: Paper Tape, 2x10 (in/yd) 1 x Per Day/30 Days Discharge Instructions: Secure dressing with tape as directed. Electronic Signature(s) Signed: 08/14/2023 1:10:39 PM By: [name] MD FACS Entered By: [name] on 08/14/2023 06:44:22 -------------------------------------------------------------------------------- Problem List Details Patient Name: Date of Service: Luke [name] PHER Gross. 08/14/2023 8:15 A M Medical Record Number: [id number] Patient Account Number: [id number] Date of Birth/Sex: Treating RN: [birth year] (37 y.o. 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 07/31/2023 No Yes without evidence of necrosis L97.522 Non-pressure chronic ulcer of other part of left foot with fat layer exposed 08/08/2023 No Yes I87.2 Venous insufficiency (chronic) (peripheral) 07/31/2023 No Yes Inactive Problems Resolved Problems Electronic Signature(s) Signed: 08/14/2023 9:42:31 AM By: [name] MD FACS Entered By: [name] on 08/14/2023 06:42:31 Luke Gross, Luke Gross ([id number]) [id number] Page 6 of 10 -------------------------------------------------------------------------------- Progress Note Details Patient Name:  Date of Service: Luke Gross 08/14/2023 8:15 A M Medical Record Number: [id number] Patient Account Number: [id number] Date of Birth/Sex: Treating RN: 1985-12-08 (37 y.o. 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 He has been tested for sickle cell disease and does not have it. This has been a recurrent issue for several years now. He has primarily been followed by podiatry. They obtained vascular studies in 2022. ABI results are copied here: +-------+-----------+-----------+------------+------------+ ABI/TBIT oday's ABIT oday's TBIPrevious ABIPrevious TBI +-------+-----------+-----------+------------+------------+ Right 1.22 0.83    +-------+-----------+-----------+------------+------------+ Left 1.26 0.74    +-------+-----------+-----------+------------+------------+ Venous reflux testing demonstrated reflux in the greater saphenous vein in the left proximal thigh, mid thigh, distal thigh, knee, and proximal calf. I do not see that he was ever referred to vascular surgery to address his venous insufficiency. He last saw podiatry 2 days ago. Apparently they dressed it with Betadine but then recommended that he apply Santyl, which he has been doing. 08/08/2023: He has a new wound on the dorsal aspect of his left foot, proximal to where the existing wound is. There is slough on the surface, but it does not appear to penetrate beyond the fat layer. The existing wound still has some tunneling and thick slough accumulation on the surface, but once the slough was removed, there appears to be granulation tissue closing over the exposed muscle. He has not received an appointment to meet with vascular surgery regarding his venous reflux. 08/14/2023: The dorsal foot wound is a little bit larger today. The more distal foot wound is about  the same size. Both have significant slough on their surfaces. Patient History Information obtained from Patient, Chart. Family History Hypertension - Mother, No family history of Cancer, Diabetes, Heart Disease, Hereditary Spherocytosis, Kidney Disease, Lung Disease, Seizures, Stroke, Thyroid Problems, Tuberculosis. 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 Medical History Ear/Nose/Mouth/Throat Denies history of Chronic sinus problems/congestion, Middle ear problems Respiratory Patient has history of Asthma Cardiovascular Patient has history of Hypertension, Peripheral Venous Disease Endocrine Denies history of Type I Diabetes, Type II Diabetes Integumentary (Skin) Denies history of History of Burn Psychiatric Denies history of Anorexia/bulimia, Confinement Anxiety Hospitalization/Surgery History - right shoulder repair. 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 no acute distress. Vitals Time Taken: 8:40 AM, Height: 68 in, Weight: 207 lbs, BMI: 31.5, Temperature: 98.2 F, Pulse: 70 bpm, Respiratory Rate: 18 breaths/min, Blood Pressure: 141/87 mmHg. Respiratory Normal work of breathing on room air. General Notes: 08/14/2023: The dorsal foot wound is a little bit larger today. The more distal foot wound is about the same size. Both have significant slough on their surfaces. Integumentary (Hair, Skin) Wound #1 status is Open. Original cause of wound was Gradually Appeared. The date acquired was: 05/19/2023. The wound has been in treatment 2 weeks. The wound is located on the Left,Distal,Dorsal Foot. The wound measures 0.6cm length x 1.8cm width x 0.5cm depth;  0.848cm^2 area and 0.424cm^3 volume. There is muscle and  Fat Layer (Subcutaneous Tissue) exposed. There is no tunneling or undermining noted. There is a medium amount of serous drainage noted. The wound margin is well defined and not attached to the wound base. There is medium (34-66%) pink granulation within the wound bed. There is a medium (34-66%) amount of necrotic tissue within the wound bed including Adherent Slough. The periwound skin appearance had no abnormalities noted for texture. The periwound skin appearance had no abnormalities noted for moisture. The periwound skin appearance had no abnormalities noted for color. Periwound temperature was noted as No Abnormality. The periwound has tenderness on palpation. Wound #2 status is Open. Original cause of wound was Gradually Appeared. The date acquired was: 08/05/2023. The wound is located on the Left,Proximal,Dorsal Foot. The wound measures 0.3cm length x 0.4cm width x 0.1cm depth; 0.094cm^2 area and 0.009cm^3 volume. There is Fat Layer (Subcutaneous Tissue) exposed. There is no tunneling or undermining noted. There is a none present amount of drainage noted. The wound margin is distinct with the outline attached to the wound base. There is no granulation within the wound bed. There is no necrotic tissue within the wound bed. The periwound skin appearance had no abnormalities noted for texture. The periwound skin appearance had no abnormalities noted for moisture. The periwound skin appearance had no abnormalities noted for color. Periwound temperature was noted as No Abnormality. 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 There was a Selective/Open Wound Non-Viable Tissue Debridement with a total area of 0.85 sq cm performed by [name], MD. With the following instrument(s): Curette to remove Non-Viable tissue/material. Material removed includes [facility] after achieving pain control using Lidocaine 4% Topical Solution. A time out was conducted at 09:00, prior to the start of the procedure. A Minimum amount of bleeding was controlled with Pressure. The procedure was not tolerated well with a pain level of 8 throughout and a pain level of 5 following the procedure. Response to Treatment Comments: intense pain. Post Debridement Measurements: 0.6cm length x 1.8cm width x 0.5cm depth; 0.424cm^3 volume. Character of Wound/Ulcer Post Debridement is improved. Post procedure Diagnosis Wound #1: Same as Pre-Procedure Wound #2 Pre-procedure diagnosis of Wound #2 is a Venous Leg Ulcer located on the Left,Proximal,Dorsal Foot .Severity of Tissue Pre Debridement is: Fat layer exposed. There was a Selective/Open Wound Non-Viable Tissue Debridement with a total area of 0.09 sq cm performed by [name], MD. With the following instrument(s): Curette to remove Non-Viable tissue/material. Material removed includes [facility] after achieving pain control using Lidocaine 4% Topical Solution. A time out was conducted at 09:00, prior to the start of the procedure. A Minimum amount of bleeding was controlled with Pressure. The procedure was not tolerated well with a pain level of 8 throughout and a pain level of 5 following the procedure. Response to Treatment Comments: intense pain. Post Debridement Measurements: 0.3cm length x 0.4cm width x 0.2cm depth; 0.019cm^3 volume. Character of Wound/Ulcer Post Debridement is improved. Severity of Tissue Post Debridement is: Fat layer exposed. Post procedure Diagnosis Wound #2: Same as Pre-Procedure Plan Follow-up Appointments: Return Appointment in 1 week. - Dr.  Lady Gary RM 3 08/22/23 at 8:30 am Anesthetic: Wound #1 Left,Distal,Dorsal Foot: (In clinic) Topical Lidocaine 4% applied to wound bed  Bathing/ Shower/ Hygiene: May shower and wash wound with soap and water. Edema Control - Lymphedema / SCD / Other: Elevate legs to the level of the heart or above for 30 minutes daily and/or when sitting for 3-4 times a day throughout the day. - throughout the day Avoid standing for long periods of time. Exercise regularly Moisturize legs daily. Luke Gross, Luke Gross ([id number]) [id number] Page 8 of 10 Compression stocking or Garment 20-30 mm/Hg pressure to: - both legs daily WOUND #1: - Foot Wound Laterality: Dorsal, Left, Distal Cleanser: Wound Cleanser 1 x Per Day/30 Days Discharge Instructions: Cleanse the wound with wound cleanser prior to applying a clean dressing using gauze sponges, not tissue or cotton balls. Cleanser: Byram Ancillary Kit - 15 Day Supply 1 x Per Day/30 Days Discharge Instructions: Use supplies as instructed; Kit contains: (15) Saline Bullets; (15) 3x3 Gauze; 15 pr Gloves Prim Dressing: Santyl Ointment 1 x Per Day/30 Days ary Discharge Instructions: Apply nickel thick amount to wound bed as instructed Secondary Dressing: Woven Gauze Sponge, Non-Sterile 4x4 in 1 x Per Day/30 Days Discharge Instructions: Apply over primary dressing as directed. Secured With: Coban Self-Adherent Wrap 4x5 (in/yd) 1 x Per Day/30 Days Discharge Instructions: Secure with Coban as directed. Secured With: [employer], 4.5x3.1 (in/yd) (Generic) 1 x Per Day/30 Days Discharge Instructions: Secure with Kerlix as directed. Secured With: Paper T ape, 2x10 (in/yd) 1 x Per Day/30 Days Discharge Instructions: Secure dressing with tape as directed. WOUND #2: - Foot Wound Laterality: Dorsal, Left, Proximal Cleanser: Wound Cleanser 1 x Per Day/30 Days Discharge Instructions: Cleanse the wound with wound cleanser prior to applying a  clean dressing using gauze sponges, not tissue or cotton balls. Cleanser: Byram Ancillary Kit - 15 Day Supply 1 x Per Day/30 Days Discharge Instructions: Use supplies as instructed; Kit contains: (15) Saline Bullets; (15) 3x3 Gauze; 15 pr Gloves Prim Dressing: Santyl Ointment 1 x Per Day/30 Days ary Discharge Instructions: Apply nickel thick amount to wound bed as instructed Secondary Dressing: Woven Gauze Sponge, Non-Sterile 4x4 in 1 x Per Day/30 Days Discharge Instructions: Apply over primary dressing as directed. Secured With: Coban Self-Adherent Wrap 4x5 (in/yd) 1 x Per Day/30 Days Discharge Instructions: Secure with Coban as directed. Secured With: [employer], 4.5x3.1 (in/yd) (Generic) 1 x Per Day/30 Days Discharge Instructions: Secure with Kerlix as directed. Secured With: Paper T ape, 2x10 (in/yd) 1 x Per Day/30 Days Discharge Instructions: Secure dressing with tape as directed. 08/14/2023: The dorsal foot wound is a little bit larger today. The more distal foot wound is about the same size. Both have significant slough on their surfaces. I used a curette to debride slough from both of the wounds. I think he will continue to benefit from ongoing enzymatic debridement so we will continue the collagenase. He is still waiting to get an appointment to discuss venous ablation with vascular surgery. He will follow-up with me in 1 week. Electronic Signature(s) Signed: 08/14/2023 9:46:19 AM By: [name] MD FACS Entered By: [name] on 08/14/2023 06:46:19 -------------------------------------------------------------------------------- HxROS Details Patient Name: Date of Service: Luke [name] PHER Gross. 08/14/2023 8:15 A M Medical Record Number: [id number] Patient Account Number: [id number] Date of Birth/Sex: Treating RN: 02-Jul-1986 (37 y.o. M) Primary Care Provider: Sanda Linger Other Clinician: Referring Provider: Treating Provider/Extender: Royston Sinner in Treatment: 2 Information Obtained From Patient Chart Ear/Nose/Mouth/Throat Medical History: Negative for: Chronic sinus problems/congestion; Middle ear problems Past Medical History Notes: seasonal alllergies Respiratory Medical History:  Positive for: Asthma Cardiovascular Medical History: Positive for: Hypertension; Peripheral Venous Disease Luke Gross, Luke Gross ([id number]) [id number] Page 9 of 10 Endocrine Medical History: Negative for: Type I Diabetes; Type II Diabetes Integumentary (Skin) Medical History: Negative for: History of Burn Past Medical History Notes: lichen simplex chronicus Psychiatric Medical History: Negative for: Anorexia/bulimia; Confinement Anxiety Immunizations Pneumococcal Vaccine: Received Pneumococcal Vaccination: No Implantable Devices No devices added Hospitalization / Surgery History Type of Hospitalization/Surgery right shoulder repair Family and Social History Cancer: No; Diabetes: No; Heart Disease: No; Hereditary Spherocytosis: No; Hypertension: Yes - Mother; Kidney Disease: No; Lung Disease: No; Seizures: No; Stroke: No; Thyroid Problems: No; Tuberculosis: No; Current every day smoker; Marital Status - Single; Alcohol Use: Rarely; Drug Use: Current History - TCH; Caffeine Use: Rarely; Financial Concerns: No; Food, Clothing or Shelter Needs: No; Support System Lacking: No; Transportation Concerns: No Psychologist, prison and probation services) Signed: 08/14/2023 1:10:39 PM By: [name] MD FACS Entered By: [name] on 08/14/2023 06:43:45 -------------------------------------------------------------------------------- SuperBill Details Patient Name: Date of Service: Luke Gross 08/14/2023 Medical Record Number: [id number] Patient Account Number: [id number] Date of Birth/Sex: Treating RN: [birth year] (37 y.o. 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

## 2023-08-18 ENCOUNTER — Ambulatory Visit: Payer: BC Managed Care – PPO | Admitting: Podiatry

## 2023-08-19 ENCOUNTER — Other Ambulatory Visit: Payer: Self-pay | Admitting: *Deleted

## 2023-08-19 DIAGNOSIS — I872 Venous insufficiency (chronic) (peripheral): Secondary | ICD-10-CM

## 2023-08-21 ENCOUNTER — Ambulatory Visit (HOSPITAL_COMMUNITY): Admission: RE | Admit: 2023-08-21 | Payer: BC Managed Care – PPO | Source: Ambulatory Visit

## 2023-08-22 ENCOUNTER — Encounter (HOSPITAL_BASED_OUTPATIENT_CLINIC_OR_DEPARTMENT_OTHER): Payer: BC Managed Care – PPO | Admitting: General Surgery

## 2023-08-22 DIAGNOSIS — L97525 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 without evidence of necrosis: Secondary | ICD-10-CM | POA: Diagnosis not present

## 2023-08-22 NOTE — Progress Notes (Signed)
Gross, Luke ([id number]) [phone].pdf Page 1 of 10 Visit Report for 08/22/2023 Chief Complaint Document Details Patient Name: Date of Service: Luke Gross. 08/22/2023 8:30 A M Medical Record Number: [id number] Patient Account Number: [id number] Date of Birth/Sex: Treating RN: [birth year] (37 y.o. M) Primary Care Provider: Sanda Linger Other Clinician: Referring Provider: Treating Provider/Extender: Royston Sinner in Treatment: 3 Information Obtained from: Patient Chief Complaint Patient seen for complaints of Non-Healing Wound on foot, in setting of venous insufficiency Electronic Signature(s) Signed: 08/22/2023 9:13:44 AM By: [name] MD FACS Entered By: [name] on 08/22/2023 06:13:44 -------------------------------------------------------------------------------- Debridement Details Patient Name: Date of Service: [name] 08/22/2023 8:30 A M Medical Record Number: [id number] Patient Account Number: [id number] Date of Birth/Sex: Treating RN: 21-Apr-1986 (37 y.o. Luke Gross Primary Care Provider: Sanda Linger Other Clinician: Referring Provider: Treating Provider/Extender: Royston Sinner in Treatment: 3 Debridement Performed for Assessment: Wound #1 Left,Distal,Dorsal Foot Performed By: Physician [name], MD The following information was scribed by: Brenton Grills The information was scribed for: [name] Debridement Type: Debridement Level of Consciousness (Pre-procedure): Awake and Alert Pre-procedure Verification/Time Out Yes - 09:08 Taken: Start Time: 09:09 Pain Control: Lidocaine 4% T opical Solution Percent of Wound Bed Debrided: 100% T Area Debrided (cm): otal 0.19 Tissue and other material debrided: Non-Viable, Slough, Subcutaneous, Slough Level: Skin/Subcutaneous Tissue Debridement Description: Excisional Instrument:  Curette Bleeding: Minimum Hemostasis Achieved: Pressure Response to Treatment: Procedure was tolerated well Level of Consciousness (Post- Awake and Alert procedure): Post Debridement Measurements of Total Wound Length: (cm) 0.4 Width: (cm) 0.6 Depth: (cm) 0.4 Volume: (cm) 0.075 Character of Wound/Ulcer Post Debridement: Improved Post Procedure Diagnosis NAIM, KURTZMAN ([id number]) [id number] Page 2 of 10 Same as Pre-procedure Electronic Signature(s) Signed: 08/22/2023 12:08:28 PM By: Brenton Grills Signed: 08/22/2023 12:31:34 PM By: [name] MD FACS Entered By: Brenton Grills on 08/22/2023 06:10:26 -------------------------------------------------------------------------------- Debridement Details Patient Name: Date of Service: [name] 08/22/2023 8:30 A M Medical Record Number: [id number] Patient Account Number: [id number] Date of Birth/Sex: Treating RN: 09/10/1986 (37 y.o. Luke Gross Primary Care Provider: Sanda Linger Other Clinician: Referring Provider: Treating Provider/Extender: Royston Sinner in Treatment: 3 Debridement Performed for Assessment: Wound #2 Left,Proximal,Dorsal Foot Performed By: Physician [name], MD The following information was scribed by: Brenton Grills The information was scribed for: [name] Debridement Type: Debridement Severity of Tissue Pre Debridement: Fat layer exposed Level of Consciousness (Pre-procedure): Awake and Alert Pre-procedure Verification/Time Out Yes - 09:08 Taken: Start Time: 09:09 Pain Control: Lidocaine 4% T opical Solution Percent of Wound Bed Debrided: 100% T Area Debrided (cm): otal 0.03 Tissue and other material debrided: Non-Viable, Slough, Subcutaneous, Slough Level: Skin/Subcutaneous Tissue Debridement Description: Excisional Instrument: Curette Bleeding: Minimum Hemostasis Achieved: Pressure Response to  Treatment: Procedure was tolerated well Level of Consciousness (Post- Awake and Alert procedure): Post Debridement Measurements of Total Wound Length: (cm) 0.2 Width: (cm) 0.2 Depth: (cm) 0.1 Volume: (cm) 0.003 Character of Wound/Ulcer Post Debridement: Improved Severity of Tissue Post Debridement: Fat layer exposed Post Procedure Diagnosis Same as Pre-procedure Electronic Signature(s) Signed: 08/22/2023 12:08:28 PM By: Brenton Grills Signed: 08/22/2023 12:31:34 PM By: [name] MD FACS Entered By: Brenton Grills on 08/22/2023 06:10:47 -------------------------------------------------------------------------------- HPI Details Patient Name: Date of Service: [name] 08/22/2023 8:30 A M Medical Record Number: [id number] Patient Account Number: [id number] Luke, Gross ([id number]) [id number] Page 3 of 10 Date of Birth/Sex: Treating  RN: Apr 23, 1986 (37 y.o. 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 He has been tested for sickle cell disease and does not have it. This has been a recurrent issue for several years now. He has primarily been followed by podiatry. They obtained vascular studies in 2022. ABI results are copied here: +-------+-----------+-----------+------------+------------+ ABI/TBIT oday's ABIT oday's TBIPrevious ABIPrevious TBI +-------+-----------+-----------+------------+------------+ Right 1.22 0.83    +-------+-----------+-----------+------------+------------+ Left 1.26 0.74    +-------+-----------+-----------+------------+------------+ Venous reflux testing demonstrated reflux in the greater saphenous vein in the left proximal  thigh, mid thigh, distal thigh, knee, and proximal calf. I do not see that he was ever referred to vascular surgery to address his venous insufficiency. He last saw podiatry 2 days ago. Apparently they dressed it with Betadine but then recommended that he apply Santyl, which he has been doing. 08/08/2023: He has a new wound on the dorsal aspect of his left foot, proximal to where the existing wound is. There is slough on the surface, but it does not appear to penetrate beyond the fat layer. The existing wound still has some tunneling and thick slough accumulation on the surface, but once the slough was removed, there appears to be granulation tissue closing over the exposed muscle. He has not received an appointment to meet with vascular surgery regarding his venous reflux. 08/14/2023: The dorsal foot wound is a little bit larger today. The more distal foot wound is about the same size. Both have significant slough on their surfaces. 08/22/2023: The dorsal foot wound is about the same size, but the more distal foot wound is smaller with less undermining. Both have slough accumulation present. Electronic Signature(s) Signed: 08/22/2023 9:14:26 AM By: [name] MD FACS Entered By: [name] on 08/22/2023 06:14:26 -------------------------------------------------------------------------------- Physical Exam Details Patient Name: Date of Service: [name] 08/22/2023 8:30 A M Medical Record Number: [id number] Patient Account Number: [id number] Date of Birth/Sex: Treating RN: 13-Oct-1986 (37 y.o. M) Primary Care Provider: Sanda Linger Other Clinician: Referring Provider: Treating Provider/Extender: Royston Sinner in Treatment: 3 Constitutional . . . . no acute distress. Respiratory Normal work of breathing on room air. Notes 08/22/2023: The dorsal foot wound is about the same size, but the more distal foot wound is smaller with less  undermining. Both have slough accumulation present. Electronic Signature(s) Signed: 08/22/2023 9:15:02 AM By: [name] MD FACS Entered By: [name] on 08/22/2023 06:15:02 [name] ([id number]) [id number] Page 4 of 10 -------------------------------------------------------------------------------- Physician Orders Details Patient Name: Date of Service: Luke Gross 08/22/2023 8:30 A M Medical Record Number: [id number] Patient Account Number: [id number] Date of Birth/Sex: Treating RN: 04/17/1986 (37 y.o. 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 - Dr. Lady Gary RM 3 Return A 08/28/2023 Anesthetic Wound #1 Left,Distal,Dorsal Foot (In clinic) Topical Lidocaine 4%  applied to wound bed Bathing/ Shower/ Hygiene May shower and wash wound with soap and water. Edema Control - Lymphedema / SCD / Other Elevate legs to the level of the heart or above for 30 minutes daily and/or when sitting for 3-4 times a day throughout the day. - throughout the day Avoid standing for long periods of time. Exercise regularly Moisturize legs daily. Compression stocking or Garment 20-30 mm/Hg pressure to: - both legs daily Wound Treatment Wound #1 - Foot Wound Laterality: Dorsal, Left, Distal Cleanser: Wound Cleanser 1 x Per Day/30  Days Discharge Instructions: Cleanse the wound with wound cleanser prior to applying a clean dressing using gauze sponges, not tissue or cotton balls. Cleanser: Byram Ancillary Kit - 15 Day Supply 1 x Per Day/30 Days Discharge Instructions: Use supplies as instructed; Kit contains: (15) Saline Bullets; (15) 3x3 Gauze; 15 pr Gloves Prim Dressing: Santyl Ointment 1 x Per Day/30 Days ary Discharge Instructions: Apply nickel thick amount to wound bed as instructed Secondary Dressing: Woven Gauze Sponge, Non-Sterile 4x4 in 1 x Per Day/30 Days Discharge Instructions: Apply over primary dressing as directed. Secured With: Coban Self-Adherent Wrap 4x5 (in/yd) 1 x Per Day/30 Days Discharge Instructions: Secure with Coban as directed. Secured With: [employer], 4.5x3.1 (in/yd) (Generic) 1 x Per Day/30 Days Discharge Instructions: Secure with Kerlix as directed. Secured With: Paper Tape, 2x10 (in/yd) 1 x Per Day/30 Days Discharge Instructions: Secure dressing with tape as directed. Wound #2 - Foot Wound Laterality: Dorsal, Left, Proximal Cleanser: Wound Cleanser 1 x Per Day/30 Days Discharge Instructions: Cleanse the wound with wound cleanser prior to applying a clean dressing using gauze sponges, not tissue or cotton balls. Cleanser: Byram Ancillary Kit - 15 Day Supply 1 x Per Day/30 Days Discharge Instructions: Use supplies as instructed; Kit contains: (15) Saline Bullets; (15) 3x3 Gauze; 15 pr Gloves Prim Dressing: Santyl Ointment 1 x Per Day/30 Days ary Discharge Instructions: Apply nickel thick amount to wound bed as instructed Secondary Dressing: Woven Gauze Sponge, Non-Sterile 4x4 in 1 x Per Day/30 Days Discharge Instructions: Apply over primary dressing as directed. BRANSON, HANNULA ([id number]) [phone].pdf Page 5 of 10 Secured With: Coban Self-Adherent Wrap 4x5 (in/yd) 1 x Per Day/30 Days Discharge Instructions: Secure with Coban as directed. Secured  With: [employer], 4.5x3.1 (in/yd) (Generic) 1 x Per Day/30 Days Discharge Instructions: Secure with Kerlix as directed. Secured With: Paper Tape, 2x10 (in/yd) 1 x Per Day/30 Days Discharge Instructions: Secure dressing with tape as directed. Electronic Signature(s) Signed: 08/22/2023 12:31:34 PM By: [name] MD FACS Entered By: [name] on 08/22/2023 06:15:17 -------------------------------------------------------------------------------- Problem List Details Patient Name: Date of Service: [name] 08/22/2023 8:30 A M Medical Record Number: [id number] Patient Account Number: [id number] Date of Birth/Sex: Treating RN: [birth year] (37 y.o. 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 08/22/2023 8:30 A M Medical Record Number:  [id number] Patient Account Number: [id number] Date of Birth/Sex: Treating RN: Dec 31, 1985 (37 y.o. 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 He has been tested for sickle cell disease and does not have it. This has been a recurrent issue for several years now. He has primarily been followed by podiatry. They obtained vascular studies in 2022. ABI results are copied here: +-------+-----------+-----------+------------+------------+ ABI/TBIT oday's ABIT oday's TBIPrevious ABIPrevious TBI +-------+-----------+-----------+------------+------------+ Right 1.22 0.83    +-------+-----------+-----------+------------+------------+ Left 1.26 0.74    +-------+-----------+-----------+------------+------------+ Venous reflux testing demonstrated reflux in the greater saphenous vein in the left proximal thigh, mid thigh, distal thigh, knee, and proximal calf. I do not see that he was ever referred to vascular surgery to address his venous insufficiency. He last saw podiatry 2 days ago. Apparently they dressed it with Betadine but then recommended that he apply Santyl, which he has been doing. 08/08/2023: He has a new wound on the dorsal aspect of his left foot, proximal to where the existing wound is. There is slough on the surface, but it does not appear to penetrate beyond the fat layer. The existing wound still has some tunneling and thick slough accumulation on the surface, but once the slough was removed, there appears to be granulation tissue closing over the exposed muscle. He has not received an appointment to meet with vascular surgery regarding his venous reflux. 08/14/2023: The dorsal foot wound is a little bit larger today. The more distal foot wound is about  the same size. Both have significant slough on their surfaces. 08/22/2023: The dorsal foot wound is about the same size, but the more distal foot wound is smaller with less undermining. Both have slough accumulation present. Patient History Information obtained from Patient, Chart. Family History Hypertension - Mother, No family history of Cancer, Diabetes, Heart Disease, Hereditary Spherocytosis, Kidney Disease, Lung Disease, Seizures, Stroke, Thyroid Problems, Tuberculosis. 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 Medical History Ear/Nose/Mouth/Throat Denies history of Chronic sinus problems/congestion, Middle ear problems Respiratory Patient has history of Asthma Cardiovascular Patient has history of Hypertension, Peripheral Venous Disease Endocrine Denies history of Type I Diabetes, Type II Diabetes Integumentary (Skin) Denies history of History of Burn Psychiatric Denies history of Anorexia/bulimia, Confinement Anxiety Hospitalization/Surgery History - right shoulder repair. 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 Vitals Time Taken: 8:44 AM, Height: 68 in, Weight: 207 lbs, BMI: 31.5, Temperature: 98.5 F, Pulse: 60 bpm, Respiratory Rate: 18 breaths/min, Blood Pressure: 131/75 mmHg. OMARION, AQUIRRE ([id number]) [phone].pdf Page 7 of 10 Respiratory Normal work of breathing on room air. General Notes: 08/22/2023: The dorsal foot wound is about the same size, but the more distal foot wound is smaller with less undermining. Both have slough accumulation present. Integumentary (Hair, Skin) Wound #1 status is Open. Original cause of wound was Gradually Appeared. The date acquired was: 05/19/2023. The wound has been in  treatment 3 weeks. The wound is located on the Left,Distal,Dorsal Foot. The wound measures 0.4cm  length x 0.6cm width x 0.4cm depth; 0.188cm^2 area and 0.075cm^3 volume. There is muscle and Fat Layer (Subcutaneous Tissue) exposed. There is no tunneling or undermining noted. There is a medium amount of serous drainage noted. The wound margin is well defined and not attached to the wound base. There is medium (34-66%) pink granulation within the wound bed. There is a medium (34-66%) amount of necrotic tissue within the wound bed including Adherent Slough. The periwound skin appearance had no abnormalities noted for texture. The periwound skin appearance had no abnormalities noted for moisture. The periwound skin appearance had no abnormalities noted for color. Periwound temperature was noted as No Abnormality. The periwound has tenderness on palpation. Wound #2 status is Open. Original cause of wound was Gradually Appeared. The date acquired was: 08/05/2023. The wound has been in treatment 2 weeks. The wound is located on the Left,Proximal,Dorsal Foot. The wound measures 0.2cm length x 0.2cm width x 0.1cm depth; 0.031cm^2 area and 0.003cm^3 volume. There is Fat Layer (Subcutaneous Tissue) exposed. There is no tunneling or undermining noted. There is a none present amount of drainage noted. The wound margin is distinct with the outline attached to the wound base. There is no granulation within the wound bed. There is no necrotic tissue within the wound bed. The periwound skin appearance had no abnormalities noted for texture. The periwound skin appearance had no abnormalities noted for moisture. The periwound skin appearance had no abnormalities noted for color. Periwound temperature was noted as No Abnormality. 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 There was a Excisional Skin/Subcutaneous Tissue Debridement with a total area of 0.19 sq cm performed by [name], MD. With the following instrument(s): Curette to remove Non-Viable tissue/material. Material removed includes Subcutaneous Tissue and Slough and after achieving pain control using Lidocaine 4% T opical Solution. No specimens were taken. A time out was conducted at 09:08, prior to the start of the procedure. A Minimum amount of bleeding was controlled with Pressure. The procedure was tolerated well. Post Debridement Measurements: 0.4cm length x 0.6cm width x 0.4cm depth; 0.075cm^3 volume. Character of Wound/Ulcer Post Debridement is improved. Post procedure Diagnosis Wound #1: Same as Pre-Procedure Wound #2 Pre-procedure diagnosis of Wound #2 is a Venous Leg Ulcer located on the Left,Proximal,Dorsal Foot .Severity of Tissue Pre Debridement is: Fat layer exposed. There was a Excisional Skin/Subcutaneous Tissue Debridement with a total area of 0.03 sq cm performed by [name], MD. With the following instrument(s): Curette to remove Non-Viable tissue/material. Material removed includes Subcutaneous Tissue and Slough and after achieving pain control using Lidocaine 4% T opical Solution. No specimens were taken. A time out was conducted at 09:08, prior to the start of the procedure. A Minimum amount of bleeding was controlled with Pressure. The procedure was tolerated well. Post Debridement Measurements: 0.2cm length x 0.2cm width x 0.1cm depth; 0.003cm^3 volume. Character of Wound/Ulcer Post Debridement is improved. Severity of Tissue Post Debridement is: Fat layer exposed. Post procedure Diagnosis Wound #2: Same as Pre-Procedure Plan Follow-up Appointments: Return Appointment in 1 week. - Dr.  Lady Gary RM 3 08/28/2023 Anesthetic: Wound #1 Left,Distal,Dorsal Foot: (In clinic) Topical Lidocaine 4% applied to wound bed Bathing/ Shower/ Hygiene: May shower and wash wound with  soap and water. Edema Control - Lymphedema / SCD / Other: Elevate legs to the level of the heart or above for 30 minutes daily and/or when sitting for 3-4 times a day throughout the day. - throughout the day Avoid standing for long periods of time. Exercise regularly Moisturize legs daily. Compression stocking or Garment 20-30 mm/Hg pressure to: - both legs daily WOUND #1: - Foot Wound Laterality: Dorsal, Left, Distal Cleanser: Wound Cleanser 1 x Per Day/30 Days Discharge Instructions: Cleanse the wound with wound cleanser prior to applying a clean dressing using gauze sponges, not tissue or cotton balls. Cleanser: Byram Ancillary Kit - 15 Day Supply 1 x Per Day/30 Days Discharge Instructions: Use supplies as instructed; Kit contains: (15) Saline Bullets; (15) 3x3 Gauze; 15 pr Gloves Prim Dressing: Santyl Ointment 1 x Per Day/30 Days MALKIEL, FLAGG ([id number]) [phone].pdf Page 8 of 10 Discharge Instructions: Apply nickel thick amount to wound bed as instructed Secondary Dressing: Woven Gauze Sponge, Non-Sterile 4x4 in 1 x Per Day/30 Days Discharge Instructions: Apply over primary dressing as directed. Secured With: Coban Self-Adherent Wrap 4x5 (in/yd) 1 x Per Day/30 Days Discharge Instructions: Secure with Coban as directed. Secured With: [employer], 4.5x3.1 (in/yd) (Generic) 1 x Per Day/30 Days Discharge Instructions: Secure with Kerlix as directed. Secured With: Paper T ape, 2x10 (in/yd) 1 x Per Day/30 Days Discharge Instructions: Secure dressing with tape as directed. WOUND #2: - Foot Wound Laterality: Dorsal, Left, Proximal Cleanser: Wound Cleanser 1 x Per Day/30 Days Discharge Instructions: Cleanse the wound with wound cleanser prior to applying a clean  dressing using gauze sponges, not tissue or cotton balls. Cleanser: Byram Ancillary Kit - 15 Day Supply 1 x Per Day/30 Days Discharge Instructions: Use supplies as instructed; Kit contains: (15) Saline Bullets; (15) 3x3 Gauze; 15 pr Gloves Prim Dressing: Santyl Ointment 1 x Per Day/30 Days ary Discharge Instructions: Apply nickel thick amount to wound bed as instructed Secondary Dressing: Woven Gauze Sponge, Non-Sterile 4x4 in 1 x Per Day/30 Days Discharge Instructions: Apply over primary dressing as directed. Secured With: Coban Self-Adherent Wrap 4x5 (in/yd) 1 x Per Day/30 Days Discharge Instructions: Secure with Coban as directed. Secured With: [employer], 4.5x3.1 (in/yd) (Generic) 1 x Per Day/30 Days Discharge Instructions: Secure with Kerlix as directed. Secured With: Paper T ape, 2x10 (in/yd) 1 x Per Day/30 Days Discharge Instructions: Secure dressing with tape as directed. 08/22/2023: The dorsal foot wound is about the same size, but the more distal foot wound is smaller with less undermining. Both have slough accumulation present. I used a curette to debride slough and subcutaneous tissue from each of his wounds. They still continue to require ongoing enzymatic debridement so we will use Santyl again. He has a repeat venous reflux study scheduled for next Tuesday and an initial consultation with Dr. Lenell Antu on November 5. Follow-up with me in 1 week. Electronic Signature(s) Signed: 08/22/2023 9:16:25 AM By: [name] MD FACS Entered By: [name] on 08/22/2023 06:16:25 -------------------------------------------------------------------------------- HxROS Details Patient Name: Date of Service: [name] 08/22/2023 8:30 A M Medical Record Number: [id number] Patient Account Number: [id number] Date of Birth/Sex: Treating RN: 11-06-1985 (37 y.o. M) Primary Care Provider: Sanda Linger Other Clinician: Referring Provider: Treating  Provider/Extender: Royston Sinner in Treatment: 3 Information Obtained From Patient Chart Ear/Nose/Mouth/Throat Medical History: Negative for: Chronic sinus problems/congestion; Middle ear problems Past Medical History Notes: seasonal alllergies Respiratory Medical History: Positive for: Asthma Cardiovascular Medical History: Positive  for: Hypertension; Peripheral Venous Disease Endocrine Medical History: Negative for: Type I Diabetes; Type II Diabetes MORONI, GRONAU ([id number]) [phone].pdf Page 9 of 10 Integumentary (Skin) Medical History: Negative for: History of Burn Past Medical History Notes: lichen simplex chronicus Psychiatric Medical History: Negative for: Anorexia/bulimia; Confinement Anxiety Immunizations Pneumococcal Vaccine: Received Pneumococcal Vaccination: No Implantable Devices No devices added Hospitalization / Surgery History Type of Hospitalization/Surgery right shoulder repair Family and Social History Cancer: No; Diabetes: No; Heart Disease: No; Hereditary Spherocytosis: No; Hypertension: Yes - Mother; Kidney Disease: No; Lung Disease: No; Seizures: No; Stroke: No; Thyroid Problems: No; Tuberculosis: No; Current every day smoker; Marital Status - Single; Alcohol Use: Rarely; Drug Use: Current History - TCH; Caffeine Use: Rarely; Financial Concerns: No; Food, Clothing or Shelter Needs: No; Support System Lacking: No; Transportation Concerns: No Electronic Signature(s) Signed: 08/22/2023 12:31:34 PM By: [name] MD FACS Entered By: [name] on 08/22/2023 06:14:35 -------------------------------------------------------------------------------- SuperBill Details Patient Name: Date of Service: [name] 08/22/2023 Medical Record Number: [id number] Patient Account Number: [id number] Date of Birth/Sex: Treating RN: 1986/05/27 (36 y.o. 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 w L97.522 Non-pressure chronic ulcer of other part of left foot with fat layer exposed Modifier: ithout evidence of ne Quantity: 1 crosis Physician Procedures : CPT4 Code Description Modifier [id number] 99214 - WC PHYS LEVEL 4 - EST PT 25 ICD-10 Diagnosis Description L97.525 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 without evidence of ne ZYMIRE, MIKES ([id number])  [phone].pdf Pa L97.525 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

## 2023-08-22 NOTE — Progress Notes (Signed)
Luke, Gross ([id number]) [id number] Page 1 of 8 Visit Report for 08/22/2023 Arrival Information Details Patient Name: Date of Service: [name]. 08/22/2023 8:30 A M Medical Record Number: [id number] Patient Account Number: [id number] Date of Birth/Sex: Treating RN: [birth year] (37 y.o. Luke Gross Primary Care Luke Gross: Luke Gross Other Clinician: Referring Luke Gross: Treating Luke Gross/Extender: Luke Gross in Treatment: 3 Visit Information History Since Last Visit All ordered tests and consults were completed: Yes Patient Arrived: Ambulatory Added or deleted any medications: No Arrival Time: 08:47 Any new allergies or adverse reactions: No Accompanied By: self Had a fall or experienced change in No Transfer Assistance: None activities of daily living that may affect Patient Identification Verified: Yes risk of falls: Secondary Verification Process Completed: Yes Signs or symptoms of abuse/neglect since last visito No Patient Requires Transmission-Based Precautions: No Hospitalized since last visit: No Patient Has Alerts: No Implantable device outside of the clinic excluding No cellular tissue based products placed in the center since last visit: Has Dressing in Place as Prescribed: Yes Pain Present Now: No Electronic Signature(s) Signed: 08/22/2023 12:08:28 PM By: Luke Gross Entered By: Luke Gross on 08/22/2023 05:48:10 -------------------------------------------------------------------------------- Encounter Discharge Information Details Patient Name: Date of Service: MA Luke Gross. 08/22/2023 8:30 A M Medical Record Number: [id number] Patient Account Number: [id number] Date of Birth/Sex: Treating RN: [birth year] (37 y.o. Luke Gross Primary Care Luke Gross: Luke Gross Other Clinician: Referring Luke Gross: Treating Luke Gross/Extender: Luke Gross in Treatment: 3 Encounter Discharge Information Items Post Procedure Vitals Discharge Condition: Stable Temperature (F): 98.5 Ambulatory Status: Ambulatory Pulse (bpm): 60 Discharge Destination: Home Respiratory Rate (breaths/min): 18 Transportation: Private Auto Blood Pressure (mmHg): 128/72 Accompanied By: self Schedule Follow-up Appointment: Yes Clinical Summary of Care: Patient Declined Electronic Signature(s) Signed: 08/22/2023 12:08:28 PM By: Luke Gross Entered By: Luke Gross on 08/22/2023 06:20:37 Luke Gross ([id number]) [id number] Page 2 of 8 -------------------------------------------------------------------------------- Lower Extremity Assessment Details Patient Name: Date of Service: [name]. 08/22/2023 8:30 A M Medical Record Number: [id number] Patient Account Number: [id number] Date of Birth/Sex: Treating RN: [birth year] (37 y.o. Luke Gross Primary Care [name]: Luke Gross Other Clinician: Referring Luke Gross: Treating Luke Gross/Extender: Luke Gross in Treatment: 3 Edema Assessment Assessed: Luke Gross: No] Luke Gross: No] Edema: [Left: N] [Right: o] Calf Left: Right: Point of Measurement: From Medial Instep 39 cm Ankle Left: Right: Point of Measurement: From Medial Instep 24 cm Vascular Assessment Pulses: Dorsalis Pedis Palpable: [Left:Yes] Extremity colors, hair growth, and conditions: Extremity Color: [Left:Normal] Hair Growth on Extremity: [Left:Yes] Temperature of Extremity: [Left:Warm] Capillary Refill: [Left:< 3 seconds] Dependent Rubor: [Left:No No] Toe Nail Assessment Left: Right: Thick: No Discolored: No Deformed: No Improper Length and Hygiene: No Electronic Signature(s) Signed: 08/22/2023 12:08:28 PM By: Luke Gross Entered By: Luke Gross on 08/22/2023  05:49:57 -------------------------------------------------------------------------------- Multi Wound Chart Details Patient Name: Date of Service: MA Luke Gross. 08/22/2023 8:30 A M Medical Record Number: [id number] Patient Account Number: [id number] Date of Birth/Sex: Treating RN: [birth year] Gross (37 y.o. M) Primary Care Luke Gross: Luke Gross Other Clinician: Referring Luke Gross: Treating Luke Gross/Extender: Luke Gross in Treatment: 3 Vital Signs Height(in): 68 Pulse(bpm): 60 Weight(lbs): 207 Blood Pressure(mmHg): 131/75 Body Mass Index(BMI): 31.5 Luke Gross, Luke Gross ([id number]) [id number] Page 3 of 8 Temperature(F): 98.5 Respiratory Rate(breaths/min): 18 [1:Photos: No Photos Left, Distal, Dorsal Foot Wound Location: Gradually Appeared Wounding Event: Atypical Primary Etiology: Asthma, Hypertension, Peripheral Comorbid History: Venous Disease 05/19/2023 Date Acquired:  3 Weeks of Treatment: Open Wound Status: No Wound  Recurrence: 0.4x0.6x0.4 Measurements Gross x W x D (cm) 0.188 A (cm) : rea 0.075 Volume (cm) : 76.10% % Reduction in A rea: 80.90% % Reduction in Volume: Full Thickness With Exposed Support Full Thickness Without Exposed Classification: Structures Medium  Exudate A mount: Serous Exudate Type: amber Exudate Color: Well defined, not attached Wound Margin: Medium (34-66%) Granulation A mount: Pink Granulation Quality: Medium (34-66%) Necrotic A mount: Fat Layer (Subcutaneous Tissue): Yes Fat Layer  (Subcutaneous Tissue): Yes N/A Exposed Structures: Muscle: Yes Fascia: No Tendon: No Joint: No Bone: No Small (1-33%) Epithelialization: Debridement - Excisional Debridement: Pre-procedure Verification/Time Out 09:08 Taken: Lidocaine 4% Topical Solution  Pain Control: Subcutaneous, Slough Tissue Debrided: Skin/Subcutaneous Tissue Level: 0.19 Debridement A (sq cm): rea Curette Instrument: Minimum Bleeding: Pressure Hemostasis A  chieved: Procedure was tolerated well Debridement Treatment Response:  0.4x0.6x0.4 Post Debridement Measurements Gross x W x D (cm) 0.075 Post Debridement Volume: (cm) No Abnormalities Noted Periwound Skin Texture: No Abnormalities Noted Periwound Skin Moisture: No Abnormalities Noted Periwound Skin Color: No Abnormality  Temperature: Yes Tenderness on Palpation: Debridement Procedures Performed:] [2:No Photos Left, Proximal, Dorsal Foot Gradually Appeared Venous Leg Ulcer Asthma, Hypertension, Peripheral Venous Disease 08/05/2023 2 Open No 0.2x0.2x0.1 0.031 0.003 0.00%  0.00% Support Structures None Present N/A N/A Distinct, outline attached None Present (0%) N/A None Present (0%) Large (67-100%) Debridement - Excisional 09:08 Lidocaine 4% Topical Solution Subcutaneous, Slough Skin/Subcutaneous Tissue 0.03 Curette  Minimum Pressure Procedure was tolerated well 0.2x0.2x0.1 0.003 No Abnormalities Noted No Abnormalities Noted No Abnormalities Noted No Abnormality N/A Debridement] [N/A:N/A N/A N/A N/A N/A N/A N/A N/A N/A N/A N/A N/A N/A N/A N/A N/A N/A N/A N/A N/A N/A  N/A N/A N/A N/A N/A N/A N/A N/A N/A N/A N/A N/A N/A N/A N/A N/A N/A N/A N/A N/A] Treatment Notes Electronic Signature(s) Signed: 08/22/2023 9:13:02 AM By: [name] MD FACS Entered By: [name] on 08/22/2023 06:13:02 -------------------------------------------------------------------------------- Multi-Disciplinary Care Plan Details Patient Name: Date of Service: MA Luke Gross. 08/22/2023 8:30 A M Medical Record Number: [id number] Patient Account Number: [id number] Date of Birth/Sex: Treating RN: [birth year] (37 y.o. Luke Gross Primary Care Wilgus Deyton: Luke Gross Other Clinician: Referring Saadia Dewitt: Treating Ady Heimann/Extender: Luke Gross in Treatment: 3 Luke Gross, Luke Gross ([id number]) [id number] Page 4 of 8 Multidisciplinary Care Plan reviewed with  physician Active Inactive Wound/Skin Impairment Nursing Diagnoses: Impaired tissue integrity Knowledge deficit related to ulceration/compromised skin integrity Goals: Patient/caregiver will verbalize understanding of skin care regimen Date Initiated: 07/31/2023 Target Resolution Date: 09/28/2023 Goal Status: Active Ulcer/skin breakdown will have a volume reduction of 30% by week 4 Date Initiated: 07/31/2023 Target Resolution Date: 09/28/2023 Goal Status: Active Interventions: Assess patient/caregiver ability to obtain necessary supplies Assess patient/caregiver ability to perform ulcer/skin care regimen upon admission and as needed Assess ulceration(s) every visit Provide education on smoking Provide education on ulcer and skin care Treatment Activities: Referred to DME Niyam Bisping for dressing supplies : 07/31/2023 Skin care regimen initiated : 07/31/2023 Topical wound management initiated : 07/31/2023 Notes: Electronic Signature(s) Signed: 08/22/2023 12:08:28 PM By: Luke Gross Entered By: Luke Gross on 08/22/2023 05:57:58 -------------------------------------------------------------------------------- Pain Assessment Details Patient Name: Date of Service: [place] Luke Gross. 08/22/2023 8:30 A M Medical Record Number: [id number] Patient Account Number: [id number] Date of Birth/Sex: Treating RN: [birth year] (37 y.o. Luke Gross Primary Care Zacharia Sowles: Luke Gross Other Clinician: Referring Tawney Vanorman: Treating Clarence Cogswell/Extender: Luke Gross in Treatment:  3 Active Problems Location of Pain Severity and Description of Pain Patient Has Paino No Site Locations [place], [place] Gross ([id number]) [id number] Page 5 of 8 Pain Management and Medication Current Pain Management: Electronic Signature(s) Signed: 08/22/2023 12:08:28 PM By: Luke Gross Entered By: Luke Gross on 08/22/2023  05:49:39 -------------------------------------------------------------------------------- Patient/Caregiver Education Details Patient Name: Date of Service: MA Luke Gross 10/18/2024andnbsp8:30 A M Medical Record Number: [id number] Patient Account Number: [id number] Date of Birth/Gender: Treating RN: [birth year] (37 y.o. Luke Gross Primary Care Physician: Luke Gross Other Clinician: Referring Physician: Treating Physician/Extender: Luke Gross in Treatment: 3 Education Assessment Education Provided To: Patient Education Topics Provided Wound/Skin Impairment: Methods: Explain/Verbal Responses: State content correctly Electronic Signature(s) Signed: 08/22/2023 12:08:28 PM By: Luke Gross Entered By: Luke Gross on 08/22/2023 05:58:16 -------------------------------------------------------------------------------- Wound Assessment Details Patient Name: Date of Service: [place] Luke Gross. 08/22/2023 8:30 A M Medical Record Number: [id number] Patient Account Number: [id number] Date of Birth/Sex: Treating RN: [birth year]ross (37 y.o. Luke Gross Primary Care Evey Mcmahan: Luke Gross Other Clinician: Referring Brody Bonneau: Treating Rowen Hur/Extender: Luke Gross, Luke Gross ([id number]) [id number] Page 6 of 8 Weeks in Treatment: 3 Wound Status Wound Number: 1 Primary Etiology: Atypical Wound Location: Left, Distal, Dorsal Foot Wound Status: Open Wounding Event: Gradually Appeared Comorbid History: Asthma, Hypertension, Peripheral Venous Disease Date Acquired: 05/19/2023 Weeks Of Treatment: 3 Clustered Wound: No Wound Measurements Length: (cm) 0.4 Width: (cm) 0.6 Depth: (cm) 0.4 Area: (cm) 0.188 Volume: (cm) 0.075 % Reduction in Area: 76.1% % Reduction in Volume: 80.9% Epithelialization: Small (1-33%) Tunneling: No Undermining: No Wound Description Classification:  Full Thickness With Exposed Support Structures Wound Margin: Well defined, not attached Exudate Amount: Medium Exudate Type: Serous Exudate Color: amber Foul Odor After Cleansing: No Slough/Fibrino Yes Wound Bed Granulation Amount: Medium (34-66%) Exposed Structure Granulation Quality: Pink Fascia Exposed: No Necrotic Amount: Medium (34-66%) Fat Layer (Subcutaneous Tissue) Exposed: Yes Necrotic Quality: Adherent Slough Tendon Exposed: No Muscle Exposed: Yes Necrosis of Muscle: No Joint Exposed: No Bone Exposed: No Periwound Skin Texture Texture Color No Abnormalities Noted: Yes No Abnormalities Noted: Yes Moisture Temperature / Pain No Abnormalities Noted: Yes Temperature: No Abnormality Tenderness on Palpation: Yes Treatment Notes Wound #1 (Foot) Wound Laterality: Dorsal, Left, Distal Cleanser Wound Cleanser Discharge Instruction: Cleanse the wound with wound cleanser prior to applying a clean dressing using gauze sponges, not tissue or cotton balls. Byram Ancillary Kit - 15 Day Supply Discharge Instruction: Use supplies as instructed; Kit contains: (15) Saline Bullets; (15) 3x3 Gauze; 15 pr Gloves Peri-Wound Care Topical Primary Dressing Santyl Ointment Discharge Instruction: Apply nickel thick amount to wound bed as instructed Secondary Dressing Woven Gauze Sponge, Non-Sterile 4x4 in Discharge Instruction: Apply over primary dressing as directed. Secured With [employer] 4x5 (in/yd) Discharge Instruction: Secure with Coban as directed. Kerlix Roll Sterile, 4.5x3.1 (in/yd) Discharge Instruction: Secure with Kerlix as directed. Paper Tape, 2x10 (in/yd) Discharge Instruction: Secure dressing with tape as directed. Compression Luke Gross, Luke Gross ([id number]) [id number] Page 7 of 8 Compression Stockings Add-Ons Electronic Signature(s) Signed: 08/22/2023 12:08:28 PM By: Luke Gross Entered By: Luke Gross on  08/22/2023 05:53:30 -------------------------------------------------------------------------------- Wound Assessment Details Patient Name: Date of Service: [place] Luke Gross. 08/22/2023 8:30 A M Medical Record Number: [id number] Patient Account Number: [id number] Date of Birth/Sex: Treating RN: [birth year]ross (37 y.o. Luke Gross Primary Care Kwamaine Cuppett: Luke Gross Other Clinician: Referring Maxim Bedel: Treating Kenden Brandt/Extender: Luke Gross in Treatment: 3 Wound Status Wound  Number: 2 Primary Etiology: Venous Leg Ulcer Wound Location: Left, Proximal, Dorsal Foot Wound Status: Open Wounding Event: Gradually Appeared Comorbid History: Asthma, Hypertension, Peripheral Venous Disease Date Acquired: 08/05/2023 Weeks Of Treatment: 2 Clustered Wound: No Wound Measurements Length: (cm) 0.2 Width: (cm) 0.2 Depth: (cm) 0.1 Area: (cm) 0.031 Volume: (cm) 0.003 % Reduction in Area: 0% % Reduction in Volume: 0% Epithelialization: Large (67-100%) Tunneling: No Undermining: No Wound Description Classification: Full Thickness Without Exposed Support Structures Wound Margin: Distinct, outline attached Exudate Amount: None Present Foul Odor After Cleansing: No Slough/Fibrino Yes Wound Bed Granulation Amount: None Present (0%) Exposed Structure Necrotic Amount: None Present (0%) Fat Layer (Subcutaneous Tissue) Exposed: Yes Periwound Skin Texture Texture Color No Abnormalities Noted: Yes No Abnormalities Noted: Yes Moisture Temperature / Pain No Abnormalities Noted: Yes Temperature: No Abnormality Treatment Notes Wound #2 (Foot) Wound Laterality: Dorsal, Left, Proximal Cleanser Wound Cleanser Discharge Instruction: Cleanse the wound with wound cleanser prior to applying a clean dressing using gauze sponges, not tissue or cotton balls. Byram Ancillary Kit - 15 Day Supply Discharge Instruction: Use supplies as instructed; Kit contains: (15) Saline  Bullets; (15) 3x3 Gauze; 15 pr Gloves Peri-Wound Care Topical Primary Dressing Santyl Ointment Discharge Instruction: Apply nickel thick amount to wound bed as instructed Luke Gross, Luke Gross ([id number]) [id number] Page 8 of 8 Secondary Dressing Woven Gauze Sponge, Non-Sterile 4x4 in Discharge Instruction: Apply over primary dressing as directed. Secured With [employer] 4x5 (in/yd) Discharge Instruction: Secure with Coban as directed. Kerlix Roll Sterile, 4.5x3.1 (in/yd) Discharge Instruction: Secure with Kerlix as directed. Paper Tape, 2x10 (in/yd) Discharge Instruction: Secure dressing with tape as directed. Compression Wrap Compression Stockings Add-Ons Electronic Signature(s) Signed: 08/22/2023 12:08:28 PM By: Luke Gross Entered By: Luke Gross on 08/22/2023 05:53:40 -------------------------------------------------------------------------------- Vitals Details Patient Name: Date of Service: [place] Luke Gross. 08/22/2023 8:30 A M Medical Record Number: [id number] Patient Account Number: [id number] Date of Birth/Sex: Treating RN: [birth year] Gross (37 y.o. Luke Gross Primary Care Tammye Kahler: Luke Gross Other Clinician: Referring Ayme Short: Treating Shanedra Lave/Extender: Luke Gross in Treatment: 3 Vital Signs Time Taken: 08:44 Temperature (F): 98.5 Height (in): 68 Pulse (bpm): 60 Weight (lbs): 207 Respiratory Rate (breaths/min): 18 Body Mass Index (BMI): 31.5 Blood Pressure (mmHg): 131/75 Reference Range: 80 - 120 mg / dl Electronic Signature(s) Signed: 08/22/2023 12:08:28 PM By: Luke Gross Entered By: Luke Gross on 08/22/2023 05:49:31

## 2023-08-24 ENCOUNTER — Other Ambulatory Visit: Payer: Self-pay | Admitting: Internal Medicine

## 2023-08-24 DIAGNOSIS — J4530 Mild persistent asthma, uncomplicated: Secondary | ICD-10-CM

## 2023-08-26 ENCOUNTER — Ambulatory Visit: Payer: BC Managed Care – PPO | Admitting: Podiatry

## 2023-08-26 ENCOUNTER — Ambulatory Visit (HOSPITAL_COMMUNITY)
Admission: RE | Admit: 2023-08-26 | Discharge: 2023-08-26 | Disposition: A | Discharge status: Home / Self Care | Payer: BC Managed Care – PPO | Source: Ambulatory Visit | Attending: Vascular Surgery | Admitting: Vascular Surgery

## 2023-08-26 DIAGNOSIS — I872 Venous insufficiency (chronic) (peripheral): Secondary | ICD-10-CM | POA: Diagnosis not present

## 2023-08-28 ENCOUNTER — Ambulatory Visit (HOSPITAL_BASED_OUTPATIENT_CLINIC_OR_DEPARTMENT_OTHER): Payer: BC Managed Care – PPO | Admitting: General Surgery

## 2023-09-05 ENCOUNTER — Encounter (HOSPITAL_BASED_OUTPATIENT_CLINIC_OR_DEPARTMENT_OTHER): Payer: BC Managed Care – PPO | Attending: General Surgery | Admitting: General Surgery

## 2023-09-05 DIAGNOSIS — J45909 Unspecified asthma, uncomplicated: Secondary | ICD-10-CM | POA: Insufficient documentation

## 2023-09-05 DIAGNOSIS — L97525 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 without evidence of necrosis: Secondary | ICD-10-CM | POA: Insufficient documentation

## 2023-09-05 DIAGNOSIS — I872 Venous insufficiency (chronic) (peripheral): Secondary | ICD-10-CM | POA: Diagnosis not present

## 2023-09-05 DIAGNOSIS — I10 Essential (primary) hypertension: Secondary | ICD-10-CM | POA: Diagnosis not present

## 2023-09-05 NOTE — Progress Notes (Signed)
Luke Gross, Luke Gross ([id number]) [id number].pdf Page 1 of 10 Visit Report for 09/05/2023 Chief Complaint Document Details Patient Name: Date of Service: [name] 09/05/2023 8:45 A M Medical Record Number: [id number] Patient Account Number: [id number] Date of Birth/Sex: Treating RN: Jul 18, 1986 (37 y.o. M) M) Primary Care Provider: Sanda Linger Other Clinician: Referring Provider: Treating Provider/Extender: Royston Sinner in Treatment: 5 Information Obtained from: Patient Chief Complaint Patient seen for complaints of Non-Healing Wound on foot, in setting of venous insufficiency Electronic Signature(s) Signed: 09/05/2023 10:00:53 AM By: [name] MD FACS Entered By: [name] on 09/05/2023 07:00:53 -------------------------------------------------------------------------------- Debridement Details Patient Name: Date of Service: [name]. 09/05/2023 8:45 A M Medical Record Number: [id number] Patient Account Number: [id number] Date of Birth/Sex: Treating RN: 01-Jul-1986 (37 y.o. Luke Gross Primary Care Provider: Sanda Linger Other Clinician: Referring Provider: Treating Provider/Extender: Royston Sinner in Treatment: 5 Debridement Performed for Assessment: Wound #1 Left,Distal,Dorsal Foot Performed By: Physician [name], MD The following information was scribed by: Zenaida Deed The information was scribed for: [name] Debridement Type: Debridement Level of Consciousness (Pre-procedure): Awake and Alert Pre-procedure Verification/Time Out Yes - 09:40 Taken: Start Time: 09:40 Pain Control: Lidocaine 4% T opical Solution Percent of Wound Bed Debrided: 100% T Area Debrided (cm): otal 0.12 Tissue and other material debrided: Viable, Non-Viable, Eschar, Slough, Subcutaneous, Slough Level: Skin/Subcutaneous Tissue Debridement Description:  Excisional Instrument: Curette Bleeding: Minimum Hemostasis Achieved: Pressure Procedural Pain: 0 Post Procedural Pain: 0 Response to Treatment: Procedure was tolerated well Level of Consciousness (Post- Awake and Alert procedure): Post Debridement Measurements of Total Wound Length: (cm) 0.3 Width: (cm) 0.5 Depth: (cm) 0.4 Volume: (cm) 0.047 Character of Wound/Ulcer Post Debridement: Improved Luke Gross, Luke Gross ([id number]) [id number] Page 2 of 10 Post Procedure Diagnosis Same as Pre-procedure Electronic Signature(s) Signed: 09/05/2023 10:27:11 AM By: [name] MD FACS Signed: 09/05/2023 1:05:52 PM By: Zenaida Deed RN, BSN Entered By: Zenaida Deed on 09/05/2023 06:44:11 -------------------------------------------------------------------------------- Debridement Details Patient Name: Date of Service: [name]. 09/05/2023 8:45 A M Medical Record Number: [id number] Patient Account Number: [id number] Date of Birth/Sex: Treating RN: 1986-01-15 (37 y.o. Luke Gross Luke Gross Primary Care Provider: Sanda Linger Other Clinician: Referring Provider: Treating Provider/Extender: Royston Sinner in Treatment: 5 Debridement Performed for Assessment: Wound #2 Left,Proximal,Dorsal Foot Performed By: Physician [name], MD The following information was scribed by: Zenaida Deed The information was scribed for: [name] Debridement Type: Debridement Severity of Tissue Pre Debridement: Fat layer exposed Level of Consciousness (Pre-procedure): Awake and Alert Pre-procedure Verification/Time Out Yes - 09:40 Taken: Start Time: 09:40 Pain Control: Lidocaine 4% T opical Solution Percent of Wound Bed Debrided: 100% T Area Debrided (cm): otal 0.03 Tissue and other material debrided: Viable, Non-Viable, Eschar, Slough, Subcutaneous, Slough Level: Skin/Subcutaneous Tissue Debridement Description:  Excisional Instrument: Curette Bleeding: Minimum Hemostasis Achieved: Pressure Procedural Pain: 0 Post Procedural Pain: 0 Response to Treatment: Procedure was tolerated well Level of Consciousness (Post- Awake and Alert procedure): Post Debridement Measurements of Total Wound Length: (cm) 0.2 Width: (cm) 0.2 Depth: (cm) 0.1 Volume: (cm) 0.003 Character of Wound/Ulcer Post Debridement: Improved Severity of Tissue Post Debridement: Fat layer exposed Post Procedure Diagnosis Same as Pre-procedure Electronic Signature(s) Signed: 09/05/2023 10:27:11 AM By: [name] MD FACS Signed: 09/05/2023 1:05:52 PM By: Zenaida Deed RN, BSN Entered By: Zenaida Deed on 09/05/2023 06:44:53 Luke Gross ([id number]) [id number] Page 3 of 10 -------------------------------------------------------------------------------- HPI Details Patient Name: Date of  Service: [name]. 09/05/2023 8:45 A M Medical Record Number: [id number] Patient Account Number: [id number] Date of Birth/Sex: Treating RN: 07/05/1986 (37 y.o. 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 He has been tested for sickle cell disease and does not have it. This has been a recurrent issue for several years now. He has primarily been followed by podiatry. They obtained vascular studies in 2022. ABI results are copied here: +-------+-----------+-----------+------------+------------+ ABI/TBIT oday's ABIT oday's TBIPrevious ABIPrevious TBI +-------+-----------+-----------+------------+------------+ Right 1.22 0.83    +-------+-----------+-----------+------------+------------+ Left 1.26 0.74     +-------+-----------+-----------+------------+------------+ Venous reflux testing demonstrated reflux in the greater saphenous vein in the left proximal thigh, mid thigh, distal thigh, knee, and proximal calf. I do not see that he was ever referred to vascular surgery to address his venous insufficiency. He last saw podiatry 2 days ago. Apparently they dressed it with Betadine but then recommended that he apply Santyl, which he has been doing. 08/08/2023: He has a new wound on the dorsal aspect of his left foot, proximal to where the existing wound is. There is slough on the surface, but it does not appear to penetrate beyond the fat layer. The existing wound still has some tunneling and thick slough accumulation on the surface, but once the slough was removed, there appears to be granulation tissue closing over the exposed muscle. He has not received an appointment to meet with vascular surgery regarding his venous reflux. 08/14/2023: The dorsal foot wound is a little bit larger today. The more distal foot wound is about the same size. Both have significant slough on their surfaces. 08/22/2023: The dorsal foot wound is about the same size, but the more distal foot wound is smaller with less undermining. Both have slough accumulation present. 09/05/2023: Both wounds are little bit smaller today and shallower, as well. There is slough and eschar accumulation. He has an appointment with vascular surgery next Tuesday to discuss possible saphenous vein ablation. Electronic Signature(s) Signed: 09/05/2023 10:02:08 AM By: [name] MD FACS Entered By: [name] on 09/05/2023 07:02:08 -------------------------------------------------------------------------------- Physical Exam Details Patient Name: Date of Service: [name]. 09/05/2023 8:45 A M Medical Record Number: [id number] Patient Account Number: [id number] Date of Birth/Sex: Treating RN: Oct 06, 1986 (37 y.o.  M) Primary Care Provider: Sanda Linger Other Clinician: Referring Provider: Treating Provider/Extender: Royston Sinner in Treatment: 5 Constitutional Hypertensive, asymptomatic. . . . no acute distress. Respiratory Normal work of breathing on room air.. Notes 09/05/2023: Both wounds are little bit smaller today and shallower, as well. There is slough and eschar accumulation. Electronic Signature(s) Signed: 09/05/2023 10:02:39 AM By: [name] MD FACS Entered By: [name] on 09/05/2023 07:02:39 Luke Gross ([id number]) [id number] Page 4 of 10 -------------------------------------------------------------------------------- Physician Orders Details Patient Name: Date of Service: [name] 09/05/2023 8:45 A M Medical Record Number: [id number] Patient Account Number: [id number] Date of Birth/Sex: Treating RN: [birth year] (37 y.o. 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 - Dr. Lady Gary Return A Anesthetic Wound #1 Left,Distal,Dorsal Foot (In clinic) Topical Lidocaine 4% applied to wound bed Bathing/ Shower/ Hygiene May shower and wash wound with soap and water. Wound Treatment Wound #1 - Foot Wound Laterality: Dorsal, Left, Distal Cleanser: Wound Cleanser 1 x Per Day/30 Days Discharge  Instructions: Cleanse the wound with wound cleanser prior to applying a clean dressing using gauze sponges, not tissue or cotton balls. Cleanser: Byram Ancillary Kit - 15 Day Supply 1 x Per Day/30 Days Discharge Instructions: Use supplies as instructed; Kit contains: (15) Saline Bullets; (15) 3x3 Gauze; 15 pr Gloves Prim Dressing: Santyl Ointment 1 x Per Day/30 Days ary Discharge Instructions: Apply nickel thick amount to wound bed as instructed Secondary Dressing: Woven Gauze Sponge, Non-Sterile 4x4 in 1 x Per Day/30 Days Discharge Instructions: Apply over primary dressing as directed. Secured With: Coban Self-Adherent Wrap 4x5 (in/yd) 1 x Per Day/30 Days Discharge Instructions: Secure with Coban as directed. Secured With: [employer], 4.5x3.1 (in/yd) (Generic) 1 x Per Day/30 Days Discharge Instructions: Secure with Kerlix as directed. Secured With: Paper Tape, 2x10 (in/yd) 1 x Per Day/30 Days Discharge Instructions: Secure dressing with tape as directed. Wound #2 - Foot Wound Laterality: Dorsal, Left, Proximal Cleanser: Wound Cleanser 1 x Per Day/30 Days Discharge Instructions: Cleanse the wound with wound cleanser prior to applying a clean dressing using gauze sponges, not tissue or cotton balls. Cleanser: Byram Ancillary Kit - 15 Day Supply 1 x Per Day/30 Days Discharge Instructions: Use supplies as instructed; Kit contains: (15) Saline Bullets; (15) 3x3 Gauze; 15 pr Gloves Prim Dressing: Santyl Ointment 1 x Per Day/30 Days ary Discharge Instructions: Apply nickel thick amount to wound bed as instructed Secondary Dressing: Woven Gauze Sponge, Non-Sterile 4x4 in 1 x Per Day/30 Days Discharge Instructions: Apply over primary dressing as directed. Secured With: Coban Self-Adherent Wrap 4x5 (in/yd) 1 x Per Day/30 Days Discharge Instructions: Secure with Coban as directed. Luke Gross, Luke Gross ([id number]) [phone].pdf Page 5 of 10 Secured With: [employer], 4.5x3.1 (in/yd) (Generic) 1 x Per Day/30 Days Discharge Instructions: Secure with Kerlix as directed. Secured With: Paper Tape, 2x10 (in/yd) 1 x Per Day/30 Days Discharge Instructions: Secure dressing with tape as directed. Electronic Signature(s) Signed: 09/05/2023 10:04:16 AM By: [name] MD FACS Entered By: [name] on 09/05/2023 07:04:15 -------------------------------------------------------------------------------- Problem List Details Patient Name: Date of Service: [name]. 09/05/2023 8:45 A M Medical Record Number: [id number] Patient Account Number: [id number] Date of Birth/Sex: Treating RN: October 23, 1986 (37 y.o. 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 09/05/2023 8:45 A M Medical Record Number: [id number] Patient Account Number: [id number] Date of Birth/Sex: Treating RN: 08/12/1986 (37 y.o. 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 He has been tested for sickle cell disease and does not have it. This has been a recurrent issue for several years now. He has primarily been followed by podiatry. They obtained vascular studies in 2022. ABI results are copied here: +-------+-----------+-----------+------------+------------+ ABI/TBIT oday's ABIT oday's TBIPrevious ABIPrevious TBI +-------+-----------+-----------+------------+------------+ Right 1.22 0.83    +-------+-----------+-----------+------------+------------+ Left 1.26 0.74    +-------+-----------+-----------+------------+------------+ Venous reflux testing demonstrated reflux in the greater saphenous vein in the left proximal thigh, mid thigh, distal thigh, knee, and proximal calf. I do not see that he was ever referred to vascular surgery to address his venous insufficiency. He last saw podiatry 2 days ago. Apparently they dressed it with Betadine but then recommended that he apply Santyl, which he has been doing. 08/08/2023: He has a new wound on the dorsal aspect of his left foot, proximal to where the existing wound is. There is slough on the surface, but it does not appear to penetrate beyond the fat layer. The existing wound still has some tunneling and thick slough accumulation on the surface, but once the slough was removed, there appears to be granulation tissue closing over the exposed muscle. He has not received an appointment to meet with vascular surgery regarding his venous reflux. 08/14/2023: The dorsal foot wound is a little bit larger today. The more distal foot wound is about the same size. Both  have significant slough on their surfaces. 08/22/2023: The dorsal foot wound is about the same size, but the more distal foot wound is smaller with less undermining. Both have slough accumulation present. 09/05/2023: Both wounds are little bit smaller today and shallower, as well. There is slough and eschar accumulation. He has an appointment with vascular surgery next Tuesday to discuss possible saphenous vein ablation. Patient History Information obtained from Patient, Chart. Family History Hypertension - Mother, No family history of Cancer, Diabetes, Heart Disease, Hereditary Spherocytosis, Kidney Disease, Lung Disease, Seizures, Stroke, Thyroid Problems, Tuberculosis. 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 Medical History Ear/Nose/Mouth/Throat Denies history of Chronic sinus problems/congestion, Middle ear problems Respiratory Patient has history of Asthma Cardiovascular Patient has history of Hypertension, Peripheral Venous Disease Endocrine Denies history of Type I Diabetes, Type II Diabetes Integumentary (Skin) Denies history of History of Burn Psychiatric Denies history of Anorexia/bulimia, Confinement Anxiety Hospitalization/Surgery History - right shoulder repair. 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 no acute distress. Vitals Time Taken: 9:23 AM, Height: 68 in, Weight: 207 lbs, BMI: 31.5, Temperature: 98.2 F, Pulse: 62 bpm, Respiratory Rate: 18 breaths/min, Blood Pressure: 154/62 mmHg. Luke Gross, Luke Gross ([id number]) [phone].pdf Page 7 of 10 Respiratory Normal work of breathing on room air.. General Notes: 09/05/2023: Both wounds are little bit smaller today and shallower, as well. There is slough  and eschar accumulation. Integumentary (Hair, Skin) Wound #1 status is Open. Original cause of wound was Gradually Appeared. The date acquired was: 05/19/2023. The wound has been in treatment 5 weeks. The wound is  located on the Left,Distal,Dorsal Foot. The wound measures 0.3cm length x 0.5cm width x 0.4cm depth; 0.118cm^2 area and 0.047cm^3 volume. There is muscle and Fat Layer (Subcutaneous Tissue) exposed. There is no tunneling or undermining noted. There is a medium amount of serous drainage noted. The wound margin is well defined and not attached to the wound base. There is large (67-100%) pink granulation within the wound bed. There is a small (1- 33%) amount of necrotic tissue within the wound bed including Eschar and Adherent Slough. The periwound skin appearance had no abnormalities noted for texture. The periwound skin appearance had no abnormalities noted for moisture. The periwound skin appearance had no abnormalities noted for color. Periwound temperature was noted as No Abnormality. The periwound has tenderness on palpation. Wound #2 status is Open. Original cause of wound was Gradually Appeared. The date acquired was: 08/05/2023. The wound has been in treatment 4 weeks. The wound is located on the Left,Proximal,Dorsal Foot. The wound measures 0.2cm length x 0.2cm width x 0.1cm depth; 0.031cm^2 area and 0.003cm^3 volume. There is Fat Layer (Subcutaneous Tissue) exposed. There is no tunneling or undermining noted. There is a none present amount of drainage noted. The wound margin is distinct with the outline attached to the wound base. There is small (1-33%) pink granulation within the wound bed. There is a large (67-100%) amount of necrotic tissue within the wound bed including Eschar. The periwound skin appearance had no abnormalities noted for texture. The periwound skin appearance had no abnormalities noted for moisture. The periwound skin appearance had no abnormalities noted for color.  Periwound temperature was noted as No Abnormality. 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 There was a Excisional Skin/Subcutaneous Tissue Debridement with a total area of 0.12 sq cm performed by [name], MD. With the following instrument(s): Curette to remove Viable and Non-Viable tissue/material. Material removed includes Eschar, Subcutaneous Tissue, and Slough after achieving pain control using Lidocaine 4% T opical Solution. No specimens were taken. A time out was conducted at 09:40, prior to the start of the procedure. A Minimum amount of bleeding was controlled with Pressure. The procedure was tolerated well with a pain level of 0 throughout and a pain level of 0 following the procedure. Post Debridement Measurements: 0.3cm length x 0.5cm width x 0.4cm depth; 0.047cm^3 volume. Character of Wound/Ulcer Post Debridement is improved. Post procedure Diagnosis Wound #1: Same as Pre-Procedure Wound #2 Pre-procedure diagnosis of Wound #2 is a Venous Leg Ulcer located on the Left,Proximal,Dorsal Foot .Severity of Tissue Pre Debridement is: Fat layer exposed. There was a Excisional Skin/Subcutaneous Tissue Debridement with a total area of 0.03 sq cm performed by [name], MD. With the following instrument(s): Curette to remove Viable and Non-Viable tissue/material. Material removed includes Eschar, Subcutaneous Tissue, and Slough after achieving pain control using Lidocaine 4% Topical Solution. No specimens were taken. A time out was conducted at 09:40, prior to the start of the procedure. A Minimum amount of bleeding was controlled with Pressure. The  procedure was tolerated well with a pain level of 0 throughout and a pain level of 0 following the procedure. Post Debridement Measurements: 0.2cm length x 0.2cm width x 0.1cm depth; 0.003cm^3 volume. Character of Wound/Ulcer Post Debridement is  improved. Severity of Tissue Post Debridement is: Fat layer exposed. Post procedure Diagnosis Wound #2: Same as Pre-Procedure Plan Follow-up Appointments: Return Appointment in 2 weeks. - Dr. Lady Gary Anesthetic: Wound #1 Left,Distal,Dorsal Foot: (In clinic) Topical Lidocaine 4% applied to wound bed Bathing/ Shower/ Hygiene: May shower and wash wound with soap and water. WOUND #1: - Foot Wound Laterality: Dorsal, Left, Distal Cleanser: Wound Cleanser 1 x Per Day/30 Days Discharge Instructions: Cleanse the wound with wound cleanser prior to applying a clean dressing using gauze sponges, not tissue or cotton balls. Cleanser: Byram Ancillary Kit - 15 Day Supply 1 x Per Day/30 Days Discharge Instructions: Use supplies as instructed; Kit contains: (15) Saline Bullets; (15) 3x3 Gauze; 15 pr Gloves Prim Dressing: Santyl Ointment 1 x Per Day/30 Days ary Discharge Instructions: Apply nickel thick amount to wound bed as instructed Secondary Dressing: Woven Gauze Sponge, Non-Sterile 4x4 in 1 x Per Day/30 Days Discharge Instructions: Apply over primary dressing as directed. Secured With: Coban Self-Adherent Wrap 4x5 (in/yd) 1 x Per Day/30 Days Discharge Instructions: Secure with Coban as directed. Luke Gross, Luke Gross ([id number]) [phone].pdf Page 8 of 10 Secured With: [employer], 4.5x3.1 (in/yd) (Generic) 1 x Per Day/30 Days Discharge Instructions: Secure with Kerlix as directed. Secured With: Paper T ape, 2x10 (in/yd) 1 x Per Day/30 Days Discharge Instructions: Secure dressing with tape as directed. WOUND #2: - Foot Wound Laterality: Dorsal, Left, Proximal Cleanser: Wound Cleanser 1 x Per Day/30 Days Discharge  Instructions: Cleanse the wound with wound cleanser prior to applying a clean dressing using gauze sponges, not tissue or cotton balls. Cleanser: Byram Ancillary Kit - 15 Day Supply 1 x Per Day/30 Days Discharge Instructions: Use supplies as instructed; Kit contains: (15) Saline Bullets; (15) 3x3 Gauze; 15 pr Gloves Prim Dressing: Santyl Ointment 1 x Per Day/30 Days ary Discharge Instructions: Apply nickel thick amount to wound bed as instructed Secondary Dressing: Woven Gauze Sponge, Non-Sterile 4x4 in 1 x Per Day/30 Days Discharge Instructions: Apply over primary dressing as directed. Secured With: Coban Self-Adherent Wrap 4x5 (in/yd) 1 x Per Day/30 Days Discharge Instructions: Secure with Coban as directed. Secured With: [employer], 4.5x3.1 (in/yd) (Generic) 1 x Per Day/30 Days Discharge Instructions: Secure with Kerlix as directed. Secured With: Paper T ape, 2x10 (in/yd) 1 x Per Day/30 Days Discharge Instructions: Secure dressing with tape as directed. 09/05/2023: Both wounds are little bit smaller today and shallower, as well. There is slough and eschar accumulation. He has an appointment with vascular surgery next Tuesday to discuss possible saphenous vein ablation. I used a curette to debride slough, eschar, and subcutaneous tissue from both of his wounds. We will continue with the use of Santyl with Kerlix and Coban wrap. Follow-up in 2 weeks due to clinic scheduling. Electronic Signature(s) Signed: 09/05/2023 10:04:55 AM By: [name] MD FACS Entered By: [name] on 09/05/2023 07:04:54 -------------------------------------------------------------------------------- HxROS Details Patient Name: Date of Service: [name]. 09/05/2023 8:45 A M Medical Record Number: [id number] Patient Account Number: [id number] Date of Birth/Sex: Treating RN: Oct 08, 1986 (37 y.o. M) Primary Care Provider: Sanda Linger Other Clinician: Referring Provider: Treating  Provider/Extender: Royston Sinner in Treatment: 5 Information Obtained From Patient Chart Ear/Nose/Mouth/Throat Medical History: Negative for: Chronic sinus problems/congestion; Middle ear problems Past Medical History Notes: seasonal alllergies Respiratory Medical History: Positive for: Asthma Cardiovascular Medical History: Positive for: Hypertension; Peripheral Venous Disease Endocrine Medical History: Negative for: Type I Diabetes; Type II Diabetes Integumentary (Skin) Medical History: Negative  for: History of Burn Luke Gross, Luke Gross ([id number]) [phone].pdf Page 9 of 10 Past Medical History Notes: lichen simplex chronicus Psychiatric Medical History: Negative for: Anorexia/bulimia; Confinement Anxiety Immunizations Pneumococcal Vaccine: Received Pneumococcal Vaccination: No Implantable Devices No devices added Hospitalization / Surgery History Type of Hospitalization/Surgery right shoulder repair Family and Social History Cancer: No; Diabetes: No; Heart Disease: No; Hereditary Spherocytosis: No; Hypertension: Yes - Mother; Kidney Disease: No; Lung Disease: No; Seizures: No; Stroke: No; Thyroid Problems: No; Tuberculosis: No; Current every day smoker; Marital Status - Single; Alcohol Use: Rarely; Drug Use: Current History - TCH; Caffeine Use: Rarely; Financial Concerns: No; Food, Clothing or Shelter Needs: No; Support System Lacking: No; Transportation Concerns: No Electronic Signature(s) Signed: 09/05/2023 10:27:11 AM By: [name] MD FACS Entered By: [name] on 09/05/2023 07:02:12 -------------------------------------------------------------------------------- SuperBill Details Patient Name: Date of Service: [name]. 09/05/2023 Medical Record Number: [id number] Patient Account Number: [id number] Date of Birth/Sex: Treating RN: 22-Dec-1985 (36 y.o. 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 w L97.522 Non-pressure chronic ulcer of other part of left foot with fat layer exposed Modifier: ithout evidence of ne Quantity: 1 crosis Physician Procedures : CPT4 Code Description Modifier [id number] 99214 - WC PHYS LEVEL 4 - EST PT ICD-10 Diagnosis Description L97.525 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 without evidence of ne L97.522 Non-pressure chronic ulcer of  other part of left foot with fat layer exposed I87.2 Venous insufficiency (chronic) (peripheral) Quantity: 1 crosis : [id number] 11042 - WC PHYS SUBQ TISS 20 SQ CM Luke Gross, Luke Gross ([id number]) [id number] Pa ICD-10 Diagnosis Description L97.525 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

## 2023-09-08 ENCOUNTER — Ambulatory Visit: Payer: BC Managed Care – PPO | Admitting: Podiatry

## 2023-09-08 NOTE — Progress Notes (Unsigned)
[facility] [place]  ASSESSMENT / PLAN: Luke Gross is a 37 y.o. male with chronic venous insufficiency of *** lower extremity causing *** (C*** disease).  Venous duplex is significant for *** Recommend compression and elevation for symptomatic relief. ***Follow up with me in three months to discuss saphenous vein ablation. ***Follow up with me as needed.   CHIEF COMPLAINT: ***  HISTORY OF PRESENT ILLNESS: Luke Gross is a 37 y.o. male ***  VASCULAR SURGICAL HISTORY: ***  VASCULAR RISK FACTORS: {FINDINGS; POSITIVE NEGATIVE:[phone]} history of stroke / transient ischemic attack. {FINDINGS; POSITIVE NEGATIVE:[phone]} history of coronary artery disease. *** history of PCI. *** history of CABG.  {FINDINGS; POSITIVE NEGATIVE:[phone]} history of diabetes mellitus. Last A1c ***. {FINDINGS; POSITIVE NEGATIVE:[phone]} history of smoking. *** actively smoking. {FINDINGS; POSITIVE NEGATIVE:[phone]} history of hypertension. *** drug regimen with *** control. {FINDINGS; POSITIVE NEGATIVE:[phone]} history of chronic kidney disease.  Last GFR ***. CKD {stage:30421363}. {FINDINGS; POSITIVE NEGATIVE:[phone]} history of chronic obstructive pulmonary disease, treated with ***.  FUNCTIONAL STATUS: ECOG performance status: {findings; ecog performance status:31780} Ambulatory status: {TNHAmbulation:25868}  CAREY 1 AND 3 YEAR INDEX Male (2pts) 75-79 or 80-84 (2pts) >84 (3pts) Dependence in toileting (1pt) Partial or full dependence in dressing (1pt) History of malignant neoplasm (2pts) CHF (3pts) COPD (1pts) CKD (3pts)  0-3 pts 6% 1 year mortality ; 21% 3 year mortality 4-5 pts 12% 1 year mortality ; 36% 3 year mortality >5 pts 21% 1 year mortality; 54% 3 year mortality   Past Medical History:  Diagnosis Date   Allergy    Asthma    Hypertension     No past surgical history on file.  Family History  Problem Relation Age of  Onset   Hypertension Mother    Stroke Neg Hx    Kidney disease Neg Hx    Hyperlipidemia Neg Hx    Heart disease Neg Hx    Hearing loss Neg Hx    Early death Neg Hx    Drug abuse Neg Hx    Diabetes Neg Hx    Cancer Neg Hx    Alcohol abuse Neg Hx     Social History   Socioeconomic History   Marital status: Single    Spouse name: Not on file   Number of children: Not on file   Years of education: Not on file   Highest education level: Not on file  Occupational History   Not on file  Tobacco Use   Smoking status: Former    Current packs/day: 0.00    Types: Cigarettes    Quit date: 04/05/2012    Years since quitting: 11.4    Passive exposure: Never   Smokeless tobacco: Never  Substance and Sexual Activity   Alcohol use: Yes    Alcohol/week: 3.0 standard drinks of alcohol    Types: 3 Glasses of wine per week   Drug use: No   Sexual activity: Yes    Partners: Female    Birth control/protection: None  Other Topics Concern   Not on file  Social History Narrative   Not on file   Social Determinants of Health   Financial Resource Strain: Not on file  Food Insecurity: Not on file  Transportation Needs: Not on file  Physical Activity: Not on file  Stress: Not on file  Social Connections: Not on file  Intimate Partner Violence: Not on file    Allergies  Allergen Reactions   Lisinopril     cough  Current Outpatient Medications  Medication Sig Dispense Refill   albuterol (PROVENTIL) (2.5 MG/3ML) 0.083% nebulizer solution USE 1 VIAL VIA NEBULIZER EVERY 4-6 HOURS AS NEEDED FOR COUGH OR WHEEZE 75 mL 5   Albuterol-Budesonide (AIRSUPRA) 90-80 MCG/ACT AERO Inhale 2 puffs into the lungs 4 (four) times daily as needed. 32.1 g 1   chlorthalidone (HYGROTON) 25 MG tablet Take 1 tablet (25 mg total) by mouth daily. 90 tablet 0   Cholecalciferol 50 MCG (2000 UT) TABS Take 2 tablets (4,000 Units total) by mouth daily. 180 tablet 1   collagenase (SANTYL) 250 UNIT/GM ointment  APPLY NICKEL THICKNESS TO WOUND AREA FOLLOWED BE WET-TO-DRY DRESSING DAILY 30 g 0   collagenase (SANTYL) 250 UNIT/GM ointment Apply 1 Application topically daily. Apply nickel size amount of ointment to wound and apply dry dressing. 30 g 0   KLOR-CON M20 20 MEQ tablet TAKE 1 TABLET BY MOUTH TWICE A DAY 180 tablet 1   montelukast (SINGULAIR) 10 MG tablet TAKE 1 TABLET BY MOUTH EVERYDAY AT BEDTIME 90 tablet 1   traMADol (ULTRAM) 50 MG tablet Take 1 tablet (50 mg total) by mouth every 6 (six) hours as needed. 75 tablet 1   WIXELA INHUB 500-50 MCG/ACT AEPB INHALE 1 PUFF INTO THE LUNGS IN THE MORNING AND AT BEDTIME. 180 each 0   No current facility-administered medications for this visit.    PHYSICAL EXAM There were no vitals filed for this visit.  Constitutional: *** appearing. *** distress. Appears *** nourished.  Neurologic: CN ***. *** focal findings. *** sensory loss. Psychiatric: *** Mood and affect symmetric and appropriate. Eyes: *** No icterus. No conjunctival pallor. Ears, nose, throat: *** mucous membranes moist. Midline trachea.  Cardiac: *** rate and rhythm.  Respiratory: *** unlabored. Abdominal: *** soft, non-tender, non-distended.  Peripheral vascular: *** Extremity: *** edema. *** cyanosis. *** pallor.  Skin: *** gangrene. *** ulceration.  Lymphatic: *** Stemmer's sign. *** palpable lymphadenopathy.    PERTINENT LABORATORY AND RADIOLOGIC DATA  Most recent CBC    Latest Ref Rng & Units 02/19/2022   10:33 AM 06/06/2021    3:04 PM 01/17/2021    4:36 PM  CBC  WBC 4.0 - 10.5 K/uL 5.5  5.7  5.4   Hemoglobin 13.0 - 17.0 g/dL 40.9  81.1  91.4   Hematocrit 39.0 - 52.0 % 42.0  39.5  40.2   Platelets 150.0 - 400.0 K/uL 222.0  222.0  255.0      Most recent CMP    Latest Ref Rng & Units 02/19/2022   10:33 AM 06/06/2021    3:04 PM 01/17/2021    4:36 PM  CMP  Glucose 70 - 99 mg/dL 95  94  79   BUN 6 - 23 mg/dL 24  17  20    Creatinine 0.40 - 1.50 mg/dL 7.82  9.56  2.13    Sodium 135 - 145 mEq/L 137  140  141   Potassium 3.5 - 5.1 mEq/L 3.7  3.6  3.4   Chloride 96 - 112 mEq/L 101  104  103   CO2 19 - 32 mEq/L 28  27  28    Calcium 8.4 - 10.5 mg/dL 9.6  9.4  9.8     Renal function CrCl cannot be calculated (Patient's most recent lab result is older than the maximum 21 days allowed.).  Hgb A1c MFr Bld (%)  Date Value  02/19/2022 6.2    LDL Cholesterol  Date Value Ref Range Status  06/06/2021 92 0 - 99  mg/dL Final     Vascular Imaging: ***  Tashera Montalvo N. Lenell Antu, MD FACS [facility] Phone Number: [phone] 09/08/2023 4:23 PM   Total time spent on preparing this encounter including chart review, data review, collecting history, examining the patient, coordinating care for this {tnhtimebilling:26202}  Portions of this report may have been transcribed using voice recognition software.  Every effort has been made to ensure accuracy; however, inadvertent computerized transcription errors may still be present.

## 2023-09-09 ENCOUNTER — Encounter: Payer: Self-pay | Admitting: Vascular Surgery

## 2023-09-09 ENCOUNTER — Ambulatory Visit: Payer: BC Managed Care – PPO | Admitting: Vascular Surgery

## 2023-09-09 VITALS — BP 143/80 | HR 60 | Temp 98.3°F | Resp 18 | Ht 67.0 in | Wt 196.3 lb

## 2023-09-09 DIAGNOSIS — I872 Venous insufficiency (chronic) (peripheral): Secondary | ICD-10-CM | POA: Diagnosis not present

## 2023-09-17 NOTE — Progress Notes (Signed)
Luke, Gross ([id number]) [phone].pdf Page 1 of 7 Visit Report for 09/05/2023 Arrival Information Details Patient Name: Date of Service: [name] 09/05/2023 8:45 A M Medical Record Number: [id number] Patient Account Number: [id number] Date of Birth/Sex: Treating RN: Jul 21, 1986 (37 y.o. M) Primary Care Marionna Gonia: Sanda Linger Other Clinician: Referring Amilah Greenspan: Treating Rosslyn Pasion/Extender: Royston Sinner in Treatment: 5 Visit Information History Since Last Visit Added or deleted any medications: No Patient Arrived: Ambulatory Any new allergies or adverse reactions: No Arrival Time: 09:22 Had a fall or experienced change in No Accompanied By: self activities of daily living that may affect Transfer Assistance: None risk of falls: Patient Identification Verified: Yes Signs or symptoms of abuse/neglect since last visito No Secondary Verification Process Completed: Yes Hospitalized since last visit: No Patient Requires Transmission-Based Precautions: No Implantable device outside of the clinic excluding No Patient Has Alerts: No cellular tissue based products placed in the center since last visit: Has Dressing in Place as Prescribed: Yes Pain Present Now: No Electronic Signature(s) Signed: 09/17/2023 11:11:27 AM By: Karl Ito Entered By: Karl Ito on 09/05/2023 06:22:57 -------------------------------------------------------------------------------- Lower Extremity Assessment Details Patient Name: Date of Service: [name] 09/05/2023 8:45 A M Medical Record Number: [id number] Patient Account Number: [id number] Date of Birth/Sex: Treating RN: [birth year] (37 y.o. Luke Gross Primary Care Kyler Germer: Sanda Linger Other Clinician: Referring Saige Canton: Treating Shenetta Schnackenberg/Extender: Royston Sinner in Treatment: 5 Edema Assessment Assessed: Luke Gross: No] [name]:  No] Edema: [Left: N] [Right: o] Calf Left: Right: Point of Measurement: From Medial Instep 38.8 cm Ankle Left: Right: Point of Measurement: From Medial Instep 24.1 cm Vascular Assessment Pulses: Dorsalis Pedis Palpable: [Left:Yes] Extremity colors, hair growth, and conditions: NEKO, [name] ([id number]) [Right:[id number] Page 2 of 7] Extremity Color: [Left:Normal] Hair Growth on Extremity: [Left:Yes] Temperature of Extremity: [Left:Warm] Capillary Refill: [Left:< 3 seconds] Dependent Rubor: [Left:No No] Electronic Signature(s) Signed: 09/05/2023 12:02:50 PM By: Karie Schwalbe RN Entered By: Karie Schwalbe on 09/05/2023 06:30:47 -------------------------------------------------------------------------------- Multi Wound Chart Details Patient Name: Date of Service: [name] 09/05/2023 8:45 A M Medical Record Number: [id number] Patient Account Number: [id number] Date of Birth/Sex: Treating RN: April 25, 1986 (37 y.o. M) Primary Care Luke Gross: Sanda Linger Other Clinician: Referring Bluford Sedler: Treating Shevette Bess/Extender: Royston Sinner in Treatment: 5 Vital Signs Height(in): 68 Pulse(bpm): 62 Weight(lbs): 207 Blood Pressure(mmHg): 154/62 Body Mass Index(BMI): 31.5 Temperature(F): 98.2 Respiratory Rate(breaths/min): 18 [1:Photos:] [N/A:N/A] Left, Distal, Dorsal Foot Left, Proximal, Dorsal Foot N/A Wound Location: Gradually Appeared Gradually Appeared N/A Wounding Event: Atypical Venous Leg Ulcer N/A Primary Etiology: Asthma, Hypertension, Peripheral Asthma, Hypertension, Peripheral N/A Comorbid History: Venous Disease Venous Disease 05/19/2023 08/05/2023 N/A Date Acquired: 5 4 N/A Weeks of Treatment: Open Open N/A Wound Status: No No N/A Wound Recurrence: 0.3x0.5x0.4 0.2x0.2x0.1 N/A Measurements L x W x D (cm) 0.118 0.031 N/A A (cm) : rea 0.047 0.003 N/A Volume (cm) : 85.00% 0.00% N/A %  Reduction in Area: 88.00% 0.00% N/A % Reduction in Volume: Full Thickness With Exposed Support Full Thickness Without Exposed N/A Classification: Structures Support Structures Medium None Present N/A Exudate A mount: Serous N/A N/A Exudate Type: amber N/A N/A Exudate Color: Well defined, not attached Distinct, outline attached N/A Wound Margin: Large (67-100%) Small (1-33%) N/A Granulation Amount: Pink Pink N/A Granulation Quality: Small (1-33%) Large (67-100%) N/A Necrotic Amount: Eschar, Adherent Slough Eschar N/A Necrotic Tissue: Fat Layer (Subcutaneous Tissue): Yes Fat Layer (Subcutaneous Tissue): Yes N/A Exposed Structures: Muscle:  Yes Fascia: No Tendon: No Joint: No Bone: No Small (1-33%) Large (67-100%) N/A Epithelialization: Debridement - Excisional Debridement - Excisional N/A Debridement: UILLIAM, KAZMER ([id number]) [phone].pdf Page 3 of 7 Pre-procedure Verification/Time Out 09:40 09:40 N/A Taken: Lidocaine 4% Topical Solution Lidocaine 4% Topical Solution N/A Pain Control: Necrotic/Eschar, Subcutaneous, Necrotic/Eschar, Subcutaneous, N/A Tissue Debrided: [facility] Skin/Subcutaneous Tissue Skin/Subcutaneous Tissue N/A Level: 0.12 0.03 N/A Debridement A (sq cm): rea Curette Curette N/A Instrument: Minimum Minimum N/A Bleeding: Pressure Pressure N/A Hemostasis A chieved: 0 0 N/A Procedural Pain: 0 0 N/A Post Procedural Pain: Procedure was tolerated well Procedure was tolerated well N/A Debridement Treatment Response: 0.3x0.5x0.4 0.2x0.2x0.1 N/A Post Debridement Measurements L x W x D (cm) 0.047 0.003 N/A Post Debridement Volume: (cm) No Abnormalities Noted No Abnormalities Noted N/A Periwound Skin Texture: No Abnormalities Noted No Abnormalities Noted N/A Periwound Skin Moisture: No Abnormalities Noted No Abnormalities Noted N/A Periwound Skin Color: No Abnormality No Abnormality N/A Temperature: Yes  N/A N/A Tenderness on Palpation: Debridement Debridement N/A Procedures Performed: Treatment Notes Electronic Signature(s) Signed: 09/05/2023 10:00:24 AM By: [name] MD FACS Entered By: [name] on 09/05/2023 07:00:24 -------------------------------------------------------------------------------- Multi-Disciplinary Care Plan Details Patient Name: Date of Service: [name] 09/05/2023 8:45 A M Medical Record Number: [id number] Patient Account Number: [id number] Date of Birth/Sex: Treating RN: 29-Mar-1986 (37 y.o. [name] Primary Care Kaya Klausing: Sanda Linger Other Clinician: Referring Toree Edling: Treating Debra Calabretta/Extender: Royston Sinner in Treatment: 5 Multidisciplinary Care Plan reviewed with physician Active Inactive Wound/Skin Impairment Nursing Diagnoses: Impaired tissue integrity Knowledge deficit related to ulceration/compromised skin integrity Goals: Patient/caregiver will verbalize understanding of skin care regimen Date Initiated: 07/31/2023 Target Resolution Date: 09/28/2023 Goal Status: Active Ulcer/skin breakdown will have a volume reduction of 30% by week 4 Date Initiated: 07/31/2023 Target Resolution Date: 09/28/2023 Goal Status: Active Interventions: Assess patient/caregiver ability to obtain necessary supplies Assess patient/caregiver ability to perform ulcer/skin care regimen upon admission and as needed Assess ulceration(s) every visit Provide education on smoking Provide education on ulcer and skin care Treatment Activities: Referred to DME Gabryela Kimbrell for dressing supplies : 07/31/2023 Skin care regimen initiated : 07/31/2023 Topical wound management initiated : 07/31/2023 JAWAAD, VALLE ([id number]) [phone].pdf Page 4 of 7 Notes: Electronic Signature(s) Signed: 09/05/2023 1:05:52 PM By: Zenaida Deed RN, BSN Entered By: Zenaida Deed on 09/05/2023  06:41:53 -------------------------------------------------------------------------------- Pain Assessment Details Patient Name: Date of Service: [name] 09/05/2023 8:45 A M Medical Record Number: [id number] Patient Account Number: [id number] Date of Birth/Sex: Treating RN: [birth year] (36 y.o. M) Primary Care Edelin Fryer: Sanda Linger Other Clinician: Referring Barron Vanloan: Treating Nasario Czerniak/Extender: Royston Sinner in Treatment: 5 Active Problems Location of Pain Severity and Description of Pain Patient Has Paino No Site Locations Pain Management and Medication Current Pain Management: Electronic Signature(s) Signed: 09/17/2023 11:11:27 AM By: Karl Ito Entered By: Karl Ito on 09/05/2023 06:24:11 -------------------------------------------------------------------------------- Patient/Caregiver Education Details Patient Name: Date of Service: MA Luke Gross 11/1/2024andnbsp8:45 A M Medical Record Number: [id number] Patient Account Number: [id number] Date of Birth/Gender: Treating RN: 1986/01/13 (37 y.o. [name] Primary Care Physician: Sanda Linger Other Clinician: Referring Physician: Treating Physician/Extender: Royston Sinner in Treatment: 5 Education Assessment JARIN, HALLQUIST ([id number]) [id number].pdf Page 5 of 7 Education Provided To: Patient Education Topics Provided Pain: Methods: Explain/Verbal Responses: Reinforcements needed, State content correctly Wound Debridement: Methods: Explain/Verbal Responses: Reinforcements needed, State content correctly Wound/Skin Impairment: Methods: Explain/Verbal Responses: Reinforcements needed, State content correctly  Electronic Signature(s) Signed: 09/05/2023 1:05:52 PM By: Zenaida Deed RN, BSN Entered By: Zenaida Deed on 09/05/2023  06:42:44 -------------------------------------------------------------------------------- Wound Assessment Details Patient Name: Date of Service: [name] 09/05/2023 8:45 A M Medical Record Number: [id number] Patient Account Number: [id number] Date of Birth/Sex: Treating RN: [birth year] (37 y.o. M) Primary Care Ori Kreiter: Sanda Linger Other Clinician: Referring Caylin Nass: Treating Shelie Lansing/Extender: Royston Sinner in Treatment: 5 Wound Status Wound Number: 1 Primary Etiology: Atypical Wound Location: Left, Distal, Dorsal Foot Wound Status: Open Wounding Event: Gradually Appeared Comorbid History: Asthma, Hypertension, Peripheral Venous Disease Date Acquired: 05/19/2023 Weeks Of Treatment: 5 Clustered Wound: No Photos Wound Measurements Length: (cm) 0.3 Width: (cm) 0.5 Depth: (cm) 0.4 Area: (cm) 0.118 Volume: (cm) 0.047 % Reduction in Area: 85% % Reduction in Volume: 88% Epithelialization: Small (1-33%) Tunneling: No Undermining: No Wound Description Classification: Full Thickness With Exposed Support Structures Wound Margin: Well defined, not attached Exudate Amount: Medium Exudate Type: Serous Abella, Jye L ([id number]) Exudate Color: amber Foul Odor After Cleansing: No Slough/Fibrino Yes [id number].pdf Page 6 of 7 Wound Bed Granulation Amount: Large (67-100%) Exposed Structure Granulation Quality: Pink Fascia Exposed: No Necrotic Amount: Small (1-33%) Fat Layer (Subcutaneous Tissue) Exposed: Yes Necrotic Quality: Eschar, Adherent Slough Tendon Exposed: No Muscle Exposed: Yes Necrosis of Muscle: No Joint Exposed: No Bone Exposed: No Periwound Skin Texture Texture Color No Abnormalities Noted: Yes No Abnormalities Noted: Yes Moisture Temperature / Pain No Abnormalities Noted: Yes Temperature: No Abnormality Tenderness on Palpation: Yes Electronic Signature(s) Signed: 09/05/2023 12:02:50 PM  By: Karie Schwalbe RN Entered By: Karie Schwalbe on 09/05/2023 06:36:53 -------------------------------------------------------------------------------- Wound Assessment Details Patient Name: Date of Service: [name] 09/05/2023 8:45 A M Medical Record Number: [id number] Patient Account Number: [id number] Date of Birth/Sex: Treating RN: 1986-05-04 (37 y.o. M) Primary Care Danh Bayus: Sanda Linger Other Clinician: Referring Arsh Feutz: Treating Grettel Rames/Extender: Royston Sinner in Treatment: 5 Wound Status Wound Number: 2 Primary Etiology: Venous Leg Ulcer Wound Location: Left, Proximal, Dorsal Foot Wound Status: Open Wounding Event: Gradually Appeared Comorbid History: Asthma, Hypertension, Peripheral Venous Disease Date Acquired: 08/05/2023 Weeks Of Treatment: 4 Clustered Wound: No Photos Wound Measurements Length: (cm) Width: (cm) Depth: (cm) Area: (cm) Volume: (cm) 0.2 % Reduction in Area: 0% 0.2 % Reduction in Volume: 0% 0.1 Epithelialization: Large (67-100%) 0.031 Tunneling: No 0.003 Undermining: No Wound Description Classification: Full Thickness Without Exposed Support Structures Wound Margin: Distinct, outline attached Exudate Amount: None Present OBERON, HAWLEY ([id number]) Foul Odor After Cleansing: No Slough/Fibrino Yes [id number].pdf Page 7 of 7 Wound Bed Granulation Amount: Small (1-33%) Exposed Structure Granulation Quality: Pink Fat Layer (Subcutaneous Tissue) Exposed: Yes Necrotic Amount: Large (67-100%) Necrotic Quality: Eschar Periwound Skin Texture Texture Color No Abnormalities Noted: Yes No Abnormalities Noted: Yes Moisture Temperature / Pain No Abnormalities Noted: Yes Temperature: No Abnormality Electronic Signature(s) Signed: 09/05/2023 12:02:50 PM By: Karie Schwalbe RN Entered By: Karie Schwalbe on 09/05/2023  06:36:02 -------------------------------------------------------------------------------- Vitals Details Patient Name: Date of Service: [name] 09/05/2023 8:45 A M Medical Record Number: [id number] Patient Account Number: [id number] Date of Birth/Sex: Treating RN: 1986-05-12 (37 y.o. M) Primary Care Jacolyn Joaquin: Sanda Linger Other Clinician: Referring Cartel Mauss: Treating Avry Monteleone/Extender: Royston Sinner in Treatment: 5 Vital Signs Time Taken: 09:23 Temperature (F): 98.2 Height (in): 68 Pulse (bpm): 62 Weight (lbs): 207 Respiratory Rate (breaths/min): 18 Body Mass Index (BMI): 31.5 Blood Pressure (mmHg): 154/62 Reference Range: 80 - 120 mg / dl Electronic Signature(s) Signed:  09/17/2023 11:11:27 AM By: Karl Ito Entered By: Karl Ito on 09/05/2023 06:24:06

## 2023-09-19 ENCOUNTER — Encounter (HOSPITAL_BASED_OUTPATIENT_CLINIC_OR_DEPARTMENT_OTHER): Payer: BC Managed Care – PPO | Admitting: General Surgery

## 2023-09-19 DIAGNOSIS — L97525 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 without evidence of necrosis: Secondary | ICD-10-CM | POA: Diagnosis not present

## 2023-09-19 NOTE — Progress Notes (Signed)
BRADLEE, LEBER ([id number]) [phone].pdf Page 1 of 8 Visit Report for 09/19/2023 Arrival Information Details Patient Name: Date of Service: Luke Gross 09/19/2023 9:15 A M Medical Record Number: [id number] Patient Account Number: [id number] Date of Birth/Sex: Treating RN: 16-May-1986 (37 y.o. M) Primary Care Luke Gross: Luke Gross Other Clinician: Referring Luke Gross: Treating Luke Gross/Extender: Luke Gross in Treatment: 7 Visit Information History Since Last Visit Added or deleted any medications: No Patient Arrived: Ambulatory Any new allergies or adverse reactions: No Arrival Time: 09:30 Had a fall or experienced change in No Accompanied By: self activities of daily living that may affect Transfer Assistance: None risk of falls: Patient Identification Verified: Yes Signs or symptoms of abuse/neglect since last visito No Secondary Verification Process Completed: Yes Hospitalized since last visit: No Patient Requires Transmission-Based Precautions: No Implantable device outside of the clinic excluding No Patient Has Alerts: No cellular tissue based products placed in the center since last visit: Has Dressing in Place as Prescribed: Yes Has Compression in Place as Prescribed: Yes Pain Present Now: No Electronic Signature(s) Signed: 09/19/2023 1:02:24 PM By: Zenaida Deed RN, BSN Entered By: Zenaida Deed on 09/19/2023 06:38:58 -------------------------------------------------------------------------------- Encounter Discharge Information Details Patient Name: Date of Service: Luke Gross. 09/19/2023 9:15 A M Medical Record Number: [id number] Patient Account Number: [id number] Date of Birth/Sex: Treating RN: 11-May-1986 (37 y.o. Dianna Limbo Primary Care Mahin Guardia: Luke Gross Other Clinician: Referring Adelis Docter: Treating Yvonnia Tango/Extender: Luke Gross in  Treatment: 7 Encounter Discharge Information Items Post Procedure Vitals Discharge Condition: Stable Temperature (F): 98.2 Ambulatory Status: Wheelchair Pulse (bpm): 66 Discharge Destination: Home Respiratory Rate (breaths/min): 18 Transportation: Private Auto Blood Pressure (mmHg): 142/85 Accompanied By: son Schedule Follow-up Appointment: Yes Clinical Summary of Care: Patient Declined Electronic Signature(s) Signed: 09/19/2023 1:08:41 PM By: Karie Schwalbe RN Entered By: Karie Schwalbe on 09/19/2023 07:50:27 Luke Gross ([id number]) [id number] Page 2 of 8 -------------------------------------------------------------------------------- Lower Extremity Assessment Details Patient Name: Date of Service: Luke Gross 09/19/2023 9:15 A M Medical Record Number: [id number] Patient Account Number: [id number] Date of Birth/Sex: Treating RN: [birth year] (37 y.o. [name] Primary Care Ludy Messamore: Luke Gross Other Clinician: Referring Fizza Scales: Treating Mamta Rimmer/Extender: Luke Gross in Treatment: 7 Edema Assessment Assessed: [name]: No] [name]: No] Edema: [Left: N] [Right: o] Calf Left: Right: Point of Measurement: From Medial Instep 38.8 cm Ankle Left: Right: Point of Measurement: From Medial Instep 24.1 cm Vascular Assessment Pulses: Dorsalis Pedis Palpable: [Left:Yes] Extremity colors, hair growth, and conditions: Extremity Color: [Left:Normal] Hair Growth on Extremity: [Left:Yes] Temperature of Extremity: [Left:Warm] Capillary Refill: [Left:< 3 seconds] Dependent Rubor: [Left:No No] Electronic Signature(s) Signed: 09/19/2023 1:02:24 PM By: Zenaida Deed RN, BSN Entered By: Zenaida Deed on 09/19/2023 06:43:30 -------------------------------------------------------------------------------- Multi Wound Chart Details Patient Name: Date of Service: Luke Gross. 09/19/2023  9:15 A M Medical Record Number: [id number] Patient Account Number: [id number] Date of Birth/Sex: Treating RN: 12-Jul-1986 (37 y.o. M) Primary Care Deronte Solis: Luke Gross Other Clinician: Referring Ruthella Kirchman: Treating Santana Gosdin/Extender: Luke Gross in Treatment: 7 Vital Signs Height(in): 68 Pulse(bpm): 66 Weight(lbs): 207 Blood Pressure(mmHg): 142/85 Body Mass Index(BMI): 31.5 Temperature(F): 98.2 Respiratory Rate(breaths/min): 18 [1:Photos:] [N/A:N/A [id number] Page 3 of 8] Left, Distal, Dorsal Foot Left, Proximal, Dorsal Foot N/A Wound Location: Gradually Appeared Gradually Appeared N/A Wounding Event: Atypical Venous Leg Ulcer N/A Primary Etiology: Asthma, Hypertension, Peripheral Asthma, Hypertension, Peripheral N/A Comorbid History: Venous Disease Venous Disease 05/19/2023 08/05/2023 N/A Date  Acquired: 7 6 N/A Weeks of Treatment: Open Healed - Epithelialized N/A Wound Status: No No N/A Wound Recurrence: 0.2x0.3x0.2 0x0x0 N/A Measurements Gross x W x D (cm) 0.047 0 N/A A (cm) : rea 0.009 0 N/A Volume (cm) : 94.00% 100.00% N/A % Reduction in A rea: 97.70% 100.00% N/A % Reduction in Volume: Full Thickness With Exposed Support Full Thickness Without Exposed N/A Classification: Structures Support Structures Medium None Present N/A Exudate A mount: Serous N/A N/A Exudate Type: amber N/A N/A Exudate Color: Well defined, not attached Distinct, outline attached N/A Wound Margin: None Present (0%) None Present (0%) N/A Granulation A mount: Large (67-100%) None Present (0%) N/A Necrotic A mount: Eschar, Adherent Slough N/A N/A Necrotic Tissue: Fat Layer (Subcutaneous Tissue): Yes Fascia: No N/A Exposed Structures: Fascia: No Fat Layer (Subcutaneous Tissue): No Tendon: No Tendon: No Muscle: No Muscle: No Joint: No Joint: No Bone: No Bone: No Medium (34-66%) Large (67-100%)  N/A Epithelialization: Debridement - Excisional N/A N/A Debridement: Pre-procedure Verification/Time Out 10:09 N/A N/A Taken: Lidocaine 4% Topical Solution N/A N/A Pain Control: Necrotic/Eschar, Subcutaneous, N/A N/A Tissue Debrided: Slough Skin/Subcutaneous Tissue N/A N/A Level: 0.05 N/A N/A Debridement A (sq cm): rea Curette N/A N/A Instrument: Minimum N/A N/A Bleeding: Pressure N/A N/A Hemostasis Achieved: Debridement Treatment Response: Procedure was tolerated well N/A N/A Post Debridement Measurements Gross x 0.2x0.3x0.2 N/A N/A W x D (cm) 0.009 N/A N/A Post Debridement Volume: (cm) No Abnormalities Noted No Abnormalities Noted N/A Periwound Skin Texture: No Abnormalities Noted No Abnormalities Noted N/A Periwound Skin Moisture: No Abnormalities Noted No Abnormalities Noted N/A Periwound Skin Color: No Abnormality No Abnormality N/A Temperature: Yes N/A N/A Tenderness on Palpation: Debridement N/A N/A Procedures Performed: Treatment Notes Electronic Signature(s) Signed: 09/19/2023 10:41:56 AM By: [name] MD FACS Entered By: [name] on 09/19/2023 07:41:55 -------------------------------------------------------------------------------- Multi-Disciplinary Care Plan Details Patient Name: Date of Service: Luke Gross. 09/19/2023 9:15 A M Medical Record Number: [id number] Patient Account Number: [id number] Date of Birth/Sex: Treating RN: Jul 15, 1986 (36 y.o. Marlan Palau Primary Care Joven Mom: Luke Gross Other Clinician: Referring Amara Justen: Treating Daniela Hernan/Extender: [place], Litterer ([id number]) [id number] Page 4 of 8 Weeks in Treatment: 7 Multidisciplinary Care Plan reviewed with physician Active Inactive Wound/Skin Impairment Nursing Diagnoses: Impaired tissue integrity Knowledge deficit related to ulceration/compromised skin  integrity Goals: Patient/caregiver will verbalize understanding of skin care regimen Date Initiated: 07/31/2023 Target Resolution Date: 09/28/2023 Goal Status: Active Ulcer/skin breakdown will have a volume reduction of 30% by week 4 Date Initiated: 07/31/2023 Target Resolution Date: 09/28/2023 Goal Status: Active Interventions: Assess patient/caregiver ability to obtain necessary supplies Assess patient/caregiver ability to perform ulcer/skin care regimen upon admission and as needed Assess ulceration(s) every visit Provide education on smoking Provide education on ulcer and skin care Treatment Activities: Referred to DME Cheryln Balcom for dressing supplies : 07/31/2023 Skin care regimen initiated : 07/31/2023 Topical wound management initiated : 07/31/2023 Notes: Electronic Signature(s) Signed: 09/19/2023 11:57:20 AM By: Samuella Bruin Entered By: Samuella Bruin on 09/19/2023 07:21:24 -------------------------------------------------------------------------------- Pain Assessment Details Patient Name: Date of Service: Luke Gross. 09/19/2023 9:15 A M Medical Record Number: [id number] Patient Account Number: [id number] Date of Birth/Sex: Treating RN: [birth year] (37 y.o. M) Primary Care Paulette [place]: Luke Gross Other Clinician: Referring Sujata Maines: Treating Ronneisha Jett/Extender: Luke Gross in Treatment: 7 Active Problems Location of Pain Severity and Description of Pain Patient Has Paino No Site Locations With Dressing Change: Yes Luke Gross, Luke Gross ([id number]) [phone].pdf Page 5  of 8 Duration of the Pain. Constant / Intermittento Intermittent Rate the pain. Current Pain Level: 0 Worst Pain Level: 1 Least Pain Level: 0 Character of Pain Describe the Pain: Tender Pain Management and Medication Current Pain Management: Is the Current Pain Management Adequate: Adequate How does your wound impact your  activities of daily livingo Sleep: No Bathing: No Appetite: No Relationship With Others: No Bladder Continence: No Emotions: No Bowel Continence: No Work: No Toileting: No Drive: No Dressing: No Hobbies: No Electronic Signature(s) Signed: 09/19/2023 1:02:24 PM By: Zenaida Deed RN, BSN Entered By: Zenaida Deed on 09/19/2023 06:40:48 -------------------------------------------------------------------------------- Patient/Caregiver Education Details Patient Name: Date of Service: Luke Luke Gross 11/15/2024andnbsp9:15 A M Medical Record Number: [id number] Patient Account Number: [id number] Date of Birth/Gender: Treating RN: November 27, 1985 (37 y.o. Marlan Palau Primary Care Physician: Luke Gross Other Clinician: Referring Physician: Treating Physician/Extender: Luke Gross in Treatment: 7 Education Assessment Education Provided To: Patient Education Topics Provided Wound/Skin Impairment: Methods: Explain/Verbal Responses: Reinforcements needed, State content correctly Electronic Signature(s) Signed: 09/19/2023 11:57:20 AM By: Samuella Bruin Entered By: Samuella Bruin on 09/19/2023 07:21:36 Luke Gross, Luke Gross ([id number]) [id number] Page 6 of 8 -------------------------------------------------------------------------------- Wound Assessment Details Patient Name: Date of Service: Luke Gross 09/19/2023 9:15 A M Medical Record Number: [id number] Patient Account Number: [id number] Date of Birth/Sex: Treating RN: 05/27/1986 (37 y.o. [name] Primary Care Shaelee Forni: Luke Gross Other Clinician: Referring Joy Reiger: Treating Lattie Riege/Extender: Luke Gross in Treatment: 7 Wound Status Wound Number: 1 Primary Etiology: Atypical Wound Location: Left, Distal, Dorsal Foot Wound Status: Open Wounding Event: Gradually Appeared Comorbid History:  Asthma, Hypertension, Peripheral Venous Disease Date Acquired: 05/19/2023 Weeks Of Treatment: 7 Clustered Wound: No Photos Wound Measurements Length: (cm) 0 Width: (cm) 0 Depth: (cm) 0 Area: (cm) Volume: (cm) .2 % Reduction in Area: 94% .3 % Reduction in Volume: 97.7% .2 Epithelialization: Medium (34-66%) 0.047 Tunneling: No 0.009 Undermining: No Wound Description Classification: Full Thickness With Exposed Suppor Wound Margin: Well defined, not attached Exudate Amount: Medium Exudate Type: Serous Exudate Color: amber t Structures Foul Odor After Cleansing: No Slough/Fibrino Yes Wound Bed Granulation Amount: None Present (0%) Exposed Structure Necrotic Amount: Large (67-100%) Fascia Exposed: No Necrotic Quality: Eschar, Adherent Slough Fat Layer (Subcutaneous Tissue) Exposed: Yes Tendon Exposed: No Muscle Exposed: No Joint Exposed: No Bone Exposed: No Periwound Skin Texture Texture Color No Abnormalities Noted: Yes No Abnormalities Noted: Yes Moisture Temperature / Pain No Abnormalities Noted: Yes Temperature: No Abnormality Tenderness on Palpation: Yes Treatment Notes Wound #1 (Foot) Wound Laterality: Dorsal, Left, Distal Cleanser Wound Cleanser Luke Gross, Luke Gross ([id number]) [phone].pdf Page 7 of 8 Discharge Instruction: Cleanse the wound with wound cleanser prior to applying a clean dressing using gauze sponges, not tissue or cotton balls. [employer] Ancillary Kit - 15 Day Supply Discharge Instruction: Use supplies as instructed; Kit contains: (15) Saline Bullets; (15) 3x3 Gauze; 15 pr Gloves Peri-Wound Care Topical Primary Dressing Santyl Ointment Discharge Instruction: Apply nickel thick amount to wound bed as instructed Secondary Dressing Woven Gauze Sponge, Non-Sterile 4x4 in Discharge Instruction: Apply over primary dressing as directed. Secured With [employer] 4x5 (in/yd) Discharge Instruction: Secure with Coban  as directed. Kerlix Roll Sterile, 4.5x3.1 (in/yd) Discharge Instruction: Secure with Kerlix as directed. Paper Tape, 2x10 (in/yd) Discharge Instruction: Secure dressing with tape as directed. Compression Wrap Compression Stockings Add-Ons Electronic Signature(s) Signed: 09/19/2023 1:02:24 PM By: Zenaida Deed RN, BSN Entered By: Zenaida Deed on 09/19/2023 06:44:20 -------------------------------------------------------------------------------- Wound  Assessment Details Patient Name: Date of Service: Luke Gross 09/19/2023 9:15 A M Medical Record Number: [id number] Patient Account Number: [id number] Date of Birth/Sex: Treating RN: 08-03-1986 (37 y.o. Marlan Palau Primary Care Darleth Eustache: Luke Gross Other Clinician: Referring Aariyah Sampey: Treating Sheryl Saintil/Extender: Luke Gross in Treatment: 7 Wound Status Wound Number: 2 Primary Etiology: Venous Leg Ulcer Wound Location: Left, Proximal, Dorsal Foot Wound Status: Healed - Epithelialized Wounding Event: Gradually Appeared Comorbid History: Asthma, Hypertension, Peripheral Venous Disease Date Acquired: 08/05/2023 Weeks Of Treatment: 6 Clustered Wound: No Photos Wound Measurements Luke Gross, Luke Gross ([id number]) Length: (cm) 0 Width: (cm) 0 Depth: (cm) 0 Area: (cm) 0 Volume: (cm) 0 [id number] Page 8 of 8 % Reduction in Area: 100% % Reduction in Volume: 100% Epithelialization: Large (67-100%) Tunneling: No Undermining: No Wound Description Classification: Full Thickness Without Exposed Support Structures Wound Margin: Distinct, outline attached Exudate Amount: None Present Foul Odor After Cleansing: No Slough/Fibrino No Wound Bed Granulation Amount: None Present (0%) Exposed Structure Necrotic Amount: None Present (0%) Fascia Exposed: No Fat Layer (Subcutaneous Tissue) Exposed: No Tendon Exposed: No Muscle Exposed: No Joint Exposed:  No Bone Exposed: No Periwound Skin Texture Texture Color No Abnormalities Noted: Yes No Abnormalities Noted: Yes Moisture Temperature / Pain No Abnormalities Noted: Yes Temperature: No Abnormality Electronic Signature(s) Signed: 09/19/2023 11:57:20 AM By: Samuella Bruin Entered By: Samuella Bruin on 09/19/2023 07:09:14 -------------------------------------------------------------------------------- Vitals Details Patient Name: Date of Service: Luke Gross. 09/19/2023 9:15 A M Medical Record Number: [id number] Patient Account Number: [id number] Date of Birth/Sex: Treating RN: [birth year] (37 y.o. M) Primary Care [place]: Luke Gross Other Clinician: Referring Nicolasa Milbrath: Treating Khari Mally/Extender: Luke Gross in Treatment: 7 Vital Signs Time Taken: 09:31 Temperature (F): 98.2 Height (in): 68 Pulse (bpm): 66 Weight (lbs): 207 Respiratory Rate (breaths/min): 18 Body Mass Index (BMI): 31.5 Blood Pressure (mmHg): 142/85 Reference Range: 80 - 120 mg / dl Electronic Signature(s) Signed: 09/19/2023 1:02:24 PM By: Zenaida Deed RN, BSN Entered By: Zenaida Deed on 09/19/2023 06:39:30

## 2023-09-19 NOTE — Progress Notes (Signed)
Luke Gross, Luke Gross ([id number]) [id number] Page 1 of 8 Visit Report for 09/19/2023 Chief Complaint Document Details Patient Name: Date of Service: Luke Gross 09/19/2023 9:15 A M Medical Record Number: [id number] Patient Account Number: [id number] Date of Birth/Sex: Treating RN: 24-Jul-1986 (37 y.o. M) Primary Care Provider: Sanda Linger Other Clinician: Referring Provider: Treating Provider/Extender: Royston Sinner in Treatment: 7 Information Obtained from: Patient Chief Complaint Patient seen for complaints of Non-Healing Wound on foot, in setting of venous insufficiency Electronic Signature(s) Signed: 09/19/2023 10:42:02 AM By: [name] MD FACS Entered By: [name] on 09/19/2023 10:42:02 -------------------------------------------------------------------------------- Debridement Details Patient Name: Date of Service: Luke [name] PHER L. 09/19/2023 9:15 A M Medical Record Number: [id number] Patient Account Number: [id number] Date of Birth/Sex: Treating RN: 28-Mar-1986 (37 y.o. Luke Gross Primary Care Provider: Sanda Linger Other Clinician: Referring Provider: Treating Provider/Extender: Royston Sinner in Treatment: 7 Debridement Performed for Assessment: Wound #1 Left,Distal,Dorsal Foot Performed By: Physician [name], MD The following information was scribed by: Samuella Bruin The information was scribed for: [name] Debridement Type: Debridement Level of Consciousness (Pre-procedure): Awake and Alert Pre-procedure Verification/Time Out Yes - 10:09 Taken: Start Time: 10:09 Pain Control: Lidocaine 4% Topical Solution Percent of Wound Bed Debrided: 100% T Area Debrided (cm): otal 0.05 Tissue and other material debrided: Non-Viable, Eschar, Slough, Subcutaneous, Slough Level: Skin/Subcutaneous Tissue Debridement Description:  Excisional Instrument: Curette Bleeding: Minimum Hemostasis Achieved: Pressure Response to Treatment: Procedure was tolerated well Level of Consciousness (Post- Awake and Alert procedure): Post Debridement Measurements of Total Wound Length: (cm) 0.2 Width: (cm) 0.3 Depth: (cm) 0.2 Volume: (cm) 0.009 Character of Wound/Ulcer Post Debridement: Improved Post Procedure Diagnosis Luke Gross, Luke Gross ([id number]) [id number] Page 2 of 8 Same as Pre-procedure Electronic Signature(s) Signed: 09/19/2023 11:57:20 AM By: Samuella Bruin Signed: 09/19/2023 12:51:55 PM By: [name] MD FACS Entered By: Samuella Bruin on 09/19/2023 10:10:08 -------------------------------------------------------------------------------- HPI Details Patient Name: Date of Service: Luke [name] PHER L. 09/19/2023 9:15 A M Medical Record Number: [id number] Patient Account Number: [id number] Date of Birth/Sex: Treating RN: Mar 28, 1986 (37 y.o. 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 He has been tested for sickle cell disease and does not have it. This has been a recurrent issue for several years now. He has primarily been followed by podiatry. They obtained vascular studies in 2022. ABI results are copied here: +-------+-----------+-----------+------------+------------+ ABI/TBIT oday's ABIT oday's TBIPrevious ABIPrevious TBI +-------+-----------+-----------+------------+------------+ Right 1.22 0.83    +-------+-----------+-----------+------------+------------+ Left 1.26 0.74    +-------+-----------+-----------+------------+------------+ Venous reflux testing demonstrated reflux in the  greater saphenous vein in the left proximal thigh, mid thigh, distal thigh, knee, and proximal calf. I do not see that he was ever referred to vascular surgery to address his venous insufficiency. He last saw podiatry 2 days ago. Apparently they dressed it with Betadine but then recommended that he apply Santyl, which he has been doing. 08/08/2023: He has a new wound on the dorsal aspect of his left foot, proximal to where the existing wound is. There is slough on the surface, but it does not appear to penetrate beyond the fat layer. The existing wound still has some tunneling and thick slough accumulation on the surface, but once the slough was removed, there appears to be granulation tissue  closing over the exposed muscle. He has not received an appointment to meet with vascular surgery regarding his venous reflux. 08/14/2023: The dorsal foot wound is a little bit larger today. The more distal foot wound is about the same size. Both have significant slough on their surfaces. 08/22/2023: The dorsal foot wound is about the same size, but the more distal foot wound is smaller with less undermining. Both have slough accumulation present. 09/05/2023: Both wounds are little bit smaller today and shallower, as well. There is slough and eschar accumulation. He has an appointment with vascular surgery next Tuesday to discuss possible saphenous vein ablation. 09/19/2023: The proximal dorsal wound is healed. The distal wound is substantially smaller and no longer has any cavity or undermining. There is some eschar accumulation on the surface. Vascular surgery did not think endovenous ablation would be beneficial. Electronic Signature(s) Signed: 09/19/2023 10:43:28 AM By: [name] MD FACS Entered By: [name] on 09/19/2023 10:43:28 -------------------------------------------------------------------------------- Physical Exam Details Patient Name: Date of Service: Luke [name] PHER L.  09/19/2023 9:15 A M Medical Record Number: [id number] Patient Account Number: [id number] Date of Birth/Sex: Treating RN: 07-Sep-1986 (37 y.o. M) Primary Care Provider: Sanda Linger Other Clinician: Referring Provider: Treating Provider/Extender: Royston Sinner in Treatment: [address] [place] L ([id number]) [id number] Page 3 of 8 Constitutional Slightly hypertensive. . . . no acute distress. Respiratory Normal work of breathing on room air.. Notes 09/19/2023: The proximal dorsal wound is healed. The distal wound is substantially smaller and no longer has any cavity or undermining. There is some eschar accumulation on the surface. Electronic Signature(s) Signed: 09/19/2023 10:43:59 AM By: [name] MD FACS Entered By: [name] on 09/19/2023 10:43:58 -------------------------------------------------------------------------------- Physician Orders Details Patient Name: Date of Service: Luke [name] PHER L. 09/19/2023 9:15 A M Medical Record Number: [id number] Patient Account Number: [id number] Date of Birth/Sex: Treating RN: [birth year] (37 y.o. 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 - Dr. Lady Gary - ROOM 3 Return A Anesthetic Wound #1 Left,Distal,Dorsal Foot (In clinic) Topical Lidocaine 4% applied  to wound bed Bathing/ Shower/ Hygiene May shower and wash wound with soap and water. Wound Treatment Wound #1 - Foot Wound Laterality: Dorsal, Left, Distal Cleanser: Wound Cleanser 1 x Per Day/30 Days Discharge Instructions: Cleanse the wound with wound cleanser prior to applying a clean dressing using gauze sponges, not tissue or cotton balls. Cleanser: Byram Ancillary Kit - 15 Day Supply 1 x Per Day/30 Days Discharge Instructions: Use supplies as instructed; Kit contains: (15) Saline Bullets; (15) 3x3 Gauze; 15 pr Gloves Prim Dressing: Santyl Ointment 1 x Per Day/30 Days ary Discharge Instructions: Apply nickel thick amount to wound bed as instructed Secondary Dressing: Woven Gauze Sponge, Non-Sterile 4x4 in 1 x Per Day/30 Days Discharge Instructions: Apply over primary dressing as directed. Secured With: Coban Self-Adherent Wrap 4x5 (in/yd) 1 x Per Day/30 Days Discharge Instructions: Secure with Coban as directed. Secured With: [employer], 4.5x3.1 (in/yd) (Generic) 1 x Per Day/30 Days Discharge Instructions: Secure  with Kerlix as directed. Luke Gross, Luke Gross ([id number]) [id number] Page 4 of 8 Secured With: Paper Tape, 2x10 (in/yd) 1 x Per Day/30 Days Discharge Instructions: Secure dressing with tape as directed. Patient Medications llergies: lisinopril A Notifications Medication Indication Start End 09/19/2023 lidocaine DOSE topical 4 % cream - cream topical Electronic Signature(s) Signed: 09/19/2023 10:45:53 AM By: [name] MD FACS Entered By: [name] on 09/19/2023 10:45:53 -------------------------------------------------------------------------------- Problem List Details Patient Name: Date of Service: Luke [name] PHER L. 09/19/2023 9:15 A M Medical Record Number: [id number] Patient Account Number: [id number] Date of Birth/Sex: Treating RN: [birth year] (37 y.o. 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 09/19/2023 9:15 A M Medical Record Number: [id number] Patient Account Number: [id number] Luke Gross, Luke Gross ([id number]) [id number] Page 5 of 8 Date of Birth/Sex: Treating RN: [birth year] (37 y.o. 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 He has been tested for sickle cell disease and does not have it. This has been a recurrent issue for several years now. He has primarily been followed by podiatry. They obtained vascular studies in 2022. ABI results are copied here: +-------+-----------+-----------+------------+------------+ ABI/TBIT oday's ABIT oday's TBIPrevious ABIPrevious  TBI +-------+-----------+-----------+------------+------------+ Right 1.22 0.83    +-------+-----------+-----------+------------+------------+ Left 1.26 0.74    +-------+-----------+-----------+------------+------------+ Venous reflux testing demonstrated reflux in the greater saphenous vein in the left proximal thigh, mid thigh, distal thigh, knee, and proximal calf. I do not see that he was ever referred to vascular surgery to address his venous insufficiency. He last saw podiatry 2 days ago. Apparently they dressed it with Betadine but then recommended that he apply Santyl, which he has been doing. 08/08/2023: He has a new wound on the dorsal aspect of his left foot, proximal to where the existing wound is. There is slough on the surface, but it does not appear to penetrate beyond the fat layer. The existing wound still has some tunneling and thick slough accumulation on the surface, but once the slough was removed, there appears to be granulation tissue closing over the exposed muscle. He has not received an appointment to meet with vascular surgery regarding his venous reflux. 08/14/2023: The dorsal foot wound is a little bit larger today. The more distal foot wound is about the same size. Both have significant slough on their surfaces. 08/22/2023: The dorsal foot wound is about the same size, but the more distal foot wound is smaller with less undermining. Both have slough accumulation present. 09/05/2023: Both wounds are little bit smaller today and shallower, as well. There is slough and eschar accumulation. He has an appointment with vascular  surgery next Tuesday to discuss possible saphenous vein ablation. 09/19/2023: The proximal dorsal wound is healed. The distal wound is substantially smaller and no longer has any cavity or undermining. There is some eschar accumulation on the surface. Vascular surgery did not think endovenous ablation would be beneficial. Patient  History Information obtained from Patient, Chart. Family History Hypertension - Mother, No family history of Cancer, Diabetes, Heart Disease, Hereditary Spherocytosis, Kidney Disease, Lung Disease, Seizures, Stroke, Thyroid Problems, Tuberculosis. 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 Medical History Ear/Nose/Mouth/Throat Denies history of Chronic sinus problems/congestion, Middle ear problems Respiratory Patient has history of Asthma Cardiovascular Patient has history of Hypertension, Peripheral Venous Disease Endocrine Denies history of Type I Diabetes, Type II Diabetes Integumentary (Skin) Denies history of History of Burn Psychiatric Denies history of Anorexia/bulimia, Confinement Anxiety Hospitalization/Surgery History - right shoulder repair. 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 no acute distress. Vitals Time Taken: 9:31 AM, Height: 68 in, Weight: 207 lbs, BMI: 31.5, Temperature: 98.2 F, Pulse: 66 bpm, Respiratory Rate: 18 breaths/min, Blood Pressure: 142/85 mmHg. Respiratory Normal work of breathing on room air.. General Notes: 09/19/2023: The proximal dorsal wound is healed. The distal wound is substantially smaller and no longer has any cavity or undermining. There is some eschar accumulation on the surface. Integumentary (Hair, Skin) Wound #1 status is Open. Original cause of wound was Gradually Appeared. The date acquired was: 05/19/2023. The wound has been in treatment 7 weeks. The wound is located on the Left,Distal,Dorsal Foot. The wound measures 0.2cm length x 0.3cm width x 0.2cm depth; 0.047cm^2 area and 0.009cm^3 volume. There is Fat  Layer (Subcutaneous Tissue) exposed. There is no tunneling or undermining noted. There is a medium amount of serous drainage noted. The wound margin is well defined and not attached to the wound base. There is no granulation within the wound bed. There is a large (67-100%) amount of necrotic tissue within the wound bed including Eschar and Adherent Slough. The periwound skin appearance had no abnormalities noted for texture. The periwound skin appearance had no abnormalities noted for moisture. The periwound skin appearance had no abnormalities noted for color. Periwound temperature was noted as No Abnormality. The periwound has tenderness on palpation. Wound #2 status is Healed - Epithelialized. Original cause of wound was Gradually Appeared. The date acquired was: 08/05/2023. The wound has been in treatment 6 weeks. The wound is located on the Left,Proximal,Dorsal Foot. The wound measures 0cm length x 0cm width x 0cm depth; 0cm^2 area and 0cm^3 volume. There is no tunneling or undermining noted. There is a none present amount of drainage noted. The wound margin is distinct with the outline attached to the wound base. There is no granulation within the wound bed. There is no necrotic tissue within the wound bed. The periwound skin appearance had no abnormalities noted for texture. The periwound skin appearance had no abnormalities noted for moisture. The periwound skin appearance had no abnormalities noted for color. Periwound temperature was noted as No Abnormality. 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 There was a Excisional Skin/Subcutaneous Tissue  Debridement with a total area of 0.05 sq cm performed by [name], MD. With the following instrument(s): Curette to remove Non-Viable tissue/material. Material removed includes Eschar, Subcutaneous Tissue, and Slough after achieving pain control using Lidocaine 4% T opical Solution. No specimens were taken. A time out was conducted at 10:09, prior to the start of the procedure. A Minimum amount of bleeding was controlled with Pressure. The procedure was tolerated well. Post Debridement Measurements: 0.2cm length x 0.3cm width x 0.2cm depth; 0.009cm^3 volume. Character of Wound/Ulcer Post Debridement is improved. Post procedure Diagnosis Wound #1: Same as Pre-Procedure Plan Follow-up Appointments: Return Appointment in 2 weeks. - Dr. Lady Gary - ROOM 3 Anesthetic: Wound #1 Left,Distal,Dorsal Foot: (In clinic) Topical Lidocaine 4% applied to wound bed Bathing/ Shower/ Hygiene: May shower and wash wound with soap and water. The following medication(s) was prescribed: lidocaine topical 4 % cream cream topical was prescribed at facility WOUND #1: - Foot Wound Laterality: Dorsal, Left, Distal Cleanser: Wound Cleanser 1 x Per Day/30 Days Discharge Instructions: Cleanse the wound with wound cleanser prior to applying a clean dressing using gauze sponges, not tissue or cotton balls. Cleanser: Byram Ancillary Kit - 15 Day Supply 1 x Per Day/30 Days Discharge Instructions: Use supplies as instructed; Kit contains: (15) Saline Bullets; (15) 3x3 Gauze; 15 pr Gloves Prim Dressing: Santyl Ointment 1 x Per Day/30 Days ary Discharge Instructions: Apply nickel thick amount to wound bed as instructed Secondary Dressing: Woven Gauze Sponge, Non-Sterile 4x4 in 1 x Per Day/30 Days Discharge Instructions: Apply over primary dressing as directed. Secured With: Coban Self-Adherent Wrap 4x5 (in/yd) 1 x Per Day/30 Days Discharge Instructions: Secure with Coban as directed. Secured With: [employer],  4.5x3.1 (in/yd) (Generic) 1 x Per Day/30 Days Luke Gross, Luke Gross ([id number]) [id number] Page 7 of 8 Discharge Instructions: Secure with Kerlix as directed. Secured With: Paper Tape, 2x10 (in/yd) 1 x Per Day/30 Days Discharge Instructions: Secure dressing with tape as directed. 09/19/2023: The proximal dorsal wound is healed. The distal wound is substantially smaller and no longer has any cavity or undermining. There is some eschar accumulation on the surface. Vascular surgery did not think endovenous ablation would be beneficial. I used a curette to debride eschar, slough, and subcutaneous tissue from the distal dorsal foot wound. We will continue Santyl with Kerlix and Coban. He will follow-up in 2 weeks. He will likely be completely healed at that time. Electronic Signature(s) Signed: 09/19/2023 10:46:36 AM By: [name] MD FACS Entered By: [name] on 09/19/2023 10:46:36 -------------------------------------------------------------------------------- HxROS Details Patient Name: Date of Service: Luke [name] PHER L. 09/19/2023 9:15 A M Medical Record Number: [id number] Patient Account Number: [id number] Date of Birth/Sex: Treating RN: 17-Oct-1986 (37 y.o. M) Primary Care Provider: Sanda Linger Other Clinician: Referring Provider: Treating Provider/Extender: Royston Sinner in Treatment: 7 Information Obtained From Patient Chart Ear/Nose/Mouth/Throat Medical History: Negative for: Chronic sinus problems/congestion; Middle ear problems Past Medical History Notes: seasonal alllergies Respiratory Medical History: Positive for: Asthma Cardiovascular Medical History: Positive for: Hypertension; Peripheral Venous Disease Endocrine Medical History: Negative for: Type I Diabetes; Type II Diabetes Integumentary (Skin) Medical History: Negative for: History of Burn Past Medical History Notes: lichen simplex  chronicus Psychiatric Medical History: Negative for: Anorexia/bulimia; Confinement Anxiety Immunizations Pneumococcal Vaccine: Received Pneumococcal Vaccination: No Luke Gross, Luke Gross ([id number]) [id number] Page 8 of 8 Implantable Devices No devices added Hospitalization / Surgery History Type of Hospitalization/Surgery right  shoulder repair Family and Social History Cancer: No; Diabetes: No; Heart Disease: No; Hereditary Spherocytosis: No; Hypertension: Yes - Mother; Kidney Disease: No; Lung Disease: No; Seizures: No; Stroke: No; Thyroid Problems: No; Tuberculosis: No; Current every day smoker; Marital Status - Single; Alcohol Use: Rarely; Drug Use: Current History - TCH; Caffeine Use: Rarely; Financial Concerns: No; Food, Clothing or Shelter Needs: No; Support System Lacking: No; Transportation Concerns: No Electronic Signature(s) Signed: 09/19/2023 12:51:55 PM By: [name] MD FACS Entered By: [name] on 09/19/2023 10:43:32 -------------------------------------------------------------------------------- SuperBill Details Patient Name: Date of Service: Luke [name] PHER L. 09/19/2023 Medical Record Number: [id number] Patient Account Number: [id number] Date of Birth/Sex: Treating RN: 12/30/1985 (37 y.o. 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 L97.522 Non-pressure chronic ulcer of other part of left foot with fat layer exposed I87.2 Venous insufficiency (chronic) (peripheral) Facility Procedures : CPT4 Code: [id number] Description: 11042 - DEB SUBQ TISSUE 20 SQ CM/< ICD-10 Diagnosis Description L97.525 Non-pressure chronic ulcer of other part of left  foot with muscle involvement w Modifier: ithout evidence of ne Quantity: 1 crosis Physician Procedures : CPT4 Code Description Modifier [id number] - WC PHYS LEVEL 4 - EST PT ICD-10 Diagnosis Description L97.525 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

## 2023-09-25 ENCOUNTER — Encounter (HOSPITAL_BASED_OUTPATIENT_CLINIC_OR_DEPARTMENT_OTHER): Payer: BC Managed Care – PPO | Admitting: General Surgery

## 2023-09-25 DIAGNOSIS — L97525 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 without evidence of necrosis: Secondary | ICD-10-CM | POA: Diagnosis not present

## 2023-09-25 NOTE — Progress Notes (Signed)
Luke Gross ([id number]) [id number] Page 1 of 8 Visit Report for 09/25/2023 Chief Complaint Document Details Patient Name: Date of Service: [name] 09/25/2023 3:30 PM Medical Record Number: [id number] Patient Account Number: [id number] Date of Birth/Sex: Treating RN: 18-Oct-1986 (37 y.o. M) Primary Care Provider: Sanda Gross Other Clinician: Referring Provider: Treating Provider/Extender: Royston Sinner in Treatment: 8 Information Obtained from: Patient Chief Complaint Patient seen for complaints of Non-Healing Wound on foot, in setting of venous insufficiency Electronic Signature(s) Signed: 09/25/2023 4:37:02 PM By: [name] MD FACS Entered By: [name] on 09/25/2023 16:37:02 -------------------------------------------------------------------------------- Debridement Details Patient Name: Date of Service: [name] 09/25/2023 3:30 PM Medical Record Number: [id number] Patient Account Number: [id number] Date of Birth/Sex: Treating RN: [birth year] (37 y.o. Luke Gross Primary Care Provider: Sanda Gross Other Clinician: Referring Provider: Treating Provider/Extender: Royston Sinner in Treatment: 8 Debridement Performed for Assessment: Wound #1 Left,Distal,Dorsal Foot Performed By: Physician [name], MD The following information was scribed by: Redmond Pulling The information was scribed for: [name] Debridement Type: Debridement Level of Consciousness (Pre-procedure): Awake and Alert Pre-procedure Verification/Time Out Yes - 15:50 Taken: Start Time: 15:50 Pain Control: Lidocaine 4% Topical Solution Percent of Wound Bed Debrided: 100% T Area Debrided (cm): otal 0.09 Tissue and other material debrided: Non-Viable, Eschar, Slough, Subcutaneous, Slough Level: Skin/Subcutaneous Tissue Debridement Description:  Excisional Instrument: Curette Bleeding: Minimum Hemostasis Achieved: Pressure Response to Treatment: Procedure was tolerated well Level of Consciousness (Post- Awake and Alert procedure): Post Debridement Measurements of Total Wound Length: (cm) 0.3 Width: (cm) 0.4 Depth: (cm) 0.1 Volume: (cm) 0.009 Character of Wound/Ulcer Post Debridement: Improved Post Procedure Diagnosis SOHAIB, BOELENS ([id number]) [id number] Page 2 of 8 Same as Pre-procedure Electronic Signature(s) Signed: 09/25/2023 4:04:30 PM By: Redmond Pulling RN, BSN Signed: 09/25/2023 4:46:47 PM By: [name] MD FACS Entered By: Redmond Pulling on 09/25/2023 15:55:35 -------------------------------------------------------------------------------- HPI Details Patient Name: Date of Service: [name] 09/25/2023 3:30 PM Medical Record Number: [id number] Patient Account Number: [id number] Date of Birth/Sex: Treating RN: [birth year] (36 y.o. 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 He has been tested for sickle cell disease and does not have it. This has been a recurrent issue for several years now. He has primarily been followed by podiatry. They obtained vascular studies in 2022. ABI results are copied here: +-------+-----------+-----------+------------+------------+ ABI/TBIT oday's ABIT oday's TBIPrevious ABIPrevious TBI +-------+-----------+-----------+------------+------------+ Right 1.22 0.83    +-------+-----------+-----------+------------+------------+ Left 1.26 0.74    +-------+-----------+-----------+------------+------------+ Venous reflux testing demonstrated reflux in the  greater saphenous vein in the left proximal thigh, mid thigh, distal thigh, knee, and proximal calf. I do not see that he was ever referred to vascular surgery to address his venous insufficiency. He last saw podiatry 2 days ago. Apparently they dressed it with Betadine but then recommended that he apply Santyl, which he has been doing. 08/08/2023: He has a new wound on the dorsal aspect of his left foot, proximal to where the existing wound is. There is slough on the surface, but it does not appear to penetrate beyond the fat layer. The existing wound still has some tunneling and thick slough accumulation on the surface, but once the slough was removed, there appears to be granulation tissue closing  over the exposed muscle. He has not received an appointment to meet with vascular surgery regarding his venous reflux. 08/14/2023: The dorsal foot wound is a little bit larger today. The more distal foot wound is about the same size. Both have significant slough on their surfaces. 08/22/2023: The dorsal foot wound is about the same size, but the more distal foot wound is smaller with less undermining. Both have slough accumulation present. 09/05/2023: Both wounds are little bit smaller today and shallower, as well. There is slough and eschar accumulation. He has an appointment with vascular surgery next Tuesday to discuss possible saphenous vein ablation. 09/19/2023: The proximal dorsal wound is healed. The distal wound is substantially smaller and no longer has any cavity or undermining. There is some eschar accumulation on the surface. Vascular surgery did not think endovenous ablation would be beneficial. 09/25/2023: The distal dorsal foot wound is considerably smaller again today. There is some eschar and slough buildup. There is no significant depth to the site any longer. Electronic Signature(s) Signed: 09/25/2023 4:37:38 PM By: [name] MD FACS Entered By: [name] on 09/25/2023  16:37:38 -------------------------------------------------------------------------------- Physical Exam Details Patient Name: Date of Service: [place] Luke Gross 09/25/2023 3:30 PM Medical Record Number: [id number] Patient Account Number: [id number] Date of Birth/Sex: Treating RN: [birth year] (37 y.o. M) Primary Care Provider: Sanda Gross Other Clinician: GAIUS, Gross ([id number]) [id number] Page 3 of 8 Referring Provider: Treating Provider/Extender: Royston Sinner in Treatment: 8 Constitutional . . . . no acute distress. Respiratory Normal work of breathing on room air.. Notes 09/25/2023: The distal dorsal foot wound is considerably smaller again today. There is some eschar and slough buildup. There is no significant depth to the site any longer. Electronic Signature(s) Signed: 09/25/2023 4:38:09 PM By: [name] MD FACS Entered By: [name] on 09/25/2023 16:38:09 -------------------------------------------------------------------------------- Physician Orders Details Patient Name: Date of Service: [name] 09/25/2023 3:30 PM Medical Record Number: [id number] Patient Account Number: [id number] Date of Birth/Sex: Treating RN: 06/07/1986 (37 y.o. 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 - Dr Lady Gary Anesthetic Wound #1 Left,Distal,Dorsal Foot (In clinic) Topical Lidocaine 4%  applied to wound bed Bathing/ Shower/ Hygiene May shower and wash wound with soap and water. Wound Treatment Wound #1 - Foot Wound Laterality: Dorsal, Left, Distal Cleanser: Wound Cleanser 1 x Per Day/30 Days Discharge Instructions: Cleanse the wound with wound cleanser prior to applying a clean dressing using gauze sponges, not tissue or cotton balls. Cleanser: Byram Ancillary Kit - 15 Day Supply 1 x Per Day/30 Days Discharge Instructions: Use supplies as instructed; Kit contains: (15) Saline Bullets; (15) 3x3 Gauze; 15 pr Gloves Prim Dressing: Santyl Ointment 1 x Per Day/30 Days ary Discharge Instructions: Apply nickel thick amount to wound bed as instructed Secondary Dressing: Woven Gauze Sponge, Non-Sterile 4x4 in 1 x Per Day/30 Days Discharge Instructions: Apply over primary dressing as directed. Secured With: Coban Self-Adherent Wrap 4x5 (in/yd) 1 x Per Day/30 Days Discharge Instructions: Secure with Coban as directed. Secured With: [employer], 4.5x3.1 (in/yd) (Generic) 1 x Per Day/30  Days Discharge Instructions: Secure with Kerlix as directed. CARWIN, MASRI ([id number]) [id number] Page 4 of 8 Secured With: Paper Tape, 2x10 (in/yd) 1 x Per Day/30 Days Discharge Instructions: Secure dressing with tape as directed. Electronic Signature(s) Signed: 09/25/2023 4:38:27 PM By: [name] MD FACS Previous Signature: 09/25/2023 4:04:30 PM Version By: Redmond Pulling RN, BSN Entered By: [name] on 09/25/2023 16:38:26 -------------------------------------------------------------------------------- Problem List Details Patient Name: Date of Service: [name] 09/25/2023 3:30 PM Medical Record Number: [id number] Patient Account Number: [id number] Date of Birth/Sex: Treating RN: 07/26/1986 (37 y.o. 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 07/31/2023 No Yes without evidence of necrosis I87.2 Venous insufficiency (chronic) (peripheral) 07/31/2023 No Yes Inactive Problems ICD-10 Code Description Active Date Inactive Date L97.522 Non-pressure chronic ulcer of other part of left foot with fat layer exposed 08/08/2023 08/08/2023 Resolved Problems Electronic Signature(s) Signed: 09/25/2023 4:36:45 PM By: [name] MD FACS Previous Signature: 09/25/2023 4:22:55 PM Version By: Shawn Stall RN, BSN Entered By: [name] on 09/25/2023 16:36:45 -------------------------------------------------------------------------------- Progress Note Details Patient Name: Date of Service: [name] 09/25/2023 3:30 PM Medical Record Number: [id number] Patient Account Number: [id number] Date of Birth/Sex: Treating RN: August 26, 1986 (37 y.o. 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 He has been tested for sickle cell disease and does not have it. This has been a recurrent issue for several years now. He has primarily been followed by podiatry. They obtained vascular studies in 2022. ABI results are copied here: +-------+-----------+-----------+------------+------------+ ABI/TBIT oday's ABIT oday's TBIPrevious  ABIPrevious TBI +-------+-----------+-----------+------------+------------+ Right 1.22 0.83    +-------+-----------+-----------+------------+------------+ Left 1.26 0.74    +-------+-----------+-----------+------------+------------+ Venous reflux testing demonstrated reflux in the greater saphenous vein in the left proximal thigh, mid thigh, distal thigh, knee, and proximal calf. I do not see that he was ever referred to vascular surgery to address his venous insufficiency. He last saw podiatry 2 days ago. Apparently they dressed it with Betadine but then recommended that he apply Santyl, which he has been doing. 08/08/2023: He has a new wound on the dorsal aspect of his left foot, proximal to where the existing wound is. There is slough on the surface, but it does not appear to penetrate beyond the fat layer. The existing wound still has some tunneling and thick slough accumulation on the surface, but once the slough was removed, there appears to be granulation tissue closing over the exposed muscle. He has not received an appointment to meet with vascular surgery regarding his venous reflux. 08/14/2023: The dorsal foot wound is a little bit larger today. The more distal foot wound is about the same size. Both have significant slough on their surfaces. 08/22/2023: The dorsal foot wound is about the same size, but the more distal foot wound is smaller with less undermining. Both have slough accumulation present. 09/05/2023: Both wounds are little bit smaller today and shallower, as well.  There is slough and eschar accumulation. He has an appointment with vascular surgery next Tuesday to discuss possible saphenous vein ablation. 09/19/2023: The proximal dorsal wound is healed. The distal wound is substantially smaller and no longer has any cavity or undermining. There is some eschar accumulation on the surface. Vascular surgery did not think endovenous ablation would be  beneficial. 09/25/2023: The distal dorsal foot wound is considerably smaller again today. There is some eschar and slough buildup. There is no significant depth to the site any longer. Patient History Information obtained from Patient, Chart. Family History Hypertension - Mother, No family history of Cancer, Diabetes, Heart Disease, Hereditary Spherocytosis, Kidney Disease, Lung Disease, Seizures, Stroke, Thyroid Problems, Tuberculosis. 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 Medical History Ear/Nose/Mouth/Throat Denies history of Chronic sinus problems/congestion, Middle ear problems Respiratory Patient has history of Asthma Cardiovascular Patient has history of Hypertension, Peripheral Venous Disease Endocrine Denies history of Type I Diabetes, Type II Diabetes Integumentary (Skin) Denies history of History of Burn Psychiatric Denies history of Anorexia/bulimia, Confinement Anxiety Hospitalization/Surgery History - right shoulder repair. 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 Vitals Time Taken: 3:41 PM, Height: 68 in, Weight: 207 lbs, BMI: 31.5, Temperature: 98.2 F, Pulse: 68 bpm, Respiratory Rate: 18 breaths/min, Blood Pressure: 137/84 mmHg. Respiratory Normal work of breathing on room air.. General Notes: 09/25/2023: The distal dorsal foot wound is considerably smaller again today. There is some eschar and slough buildup. There is no significant depth to the site any longer. Integumentary (Hair, Skin) Wound #1 status is Open. Original cause of wound was Gradually Appeared. The date acquired was: 05/19/2023. The wound has been in treatment 8 weeks. The wound  is located on the Left,Distal,Dorsal Foot. The wound measures 0.3cm length x 0.4cm width x 0.1cm depth; 0.094cm^2 area and 0.009cm^3 volume. There is Fat Layer (Subcutaneous Tissue) exposed. There is no tunneling or undermining noted. There is a medium amount of serous drainage noted. The wound margin is well defined and not attached to the wound base. There is no granulation within the wound bed. There is a large (67-100%) amount of necrotic tissue within the wound bed including Eschar and Adherent Slough. The periwound skin appearance had no abnormalities noted for texture. The periwound skin appearance had no abnormalities noted for moisture. The periwound skin appearance had no abnormalities noted for color. Periwound temperature was noted as No Abnormality. The periwound has tenderness on palpation. 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 There was a Excisional Skin/Subcutaneous Tissue Debridement with a total area of 0.09 sq cm performed by [name], MD. With the following instrument(s): Curette to remove Non-Viable tissue/material. Material removed includes Eschar, Subcutaneous Tissue, and Slough after achieving pain control using Lidocaine 4% T opical Solution. No specimens were taken. A time out was conducted at 15:50, prior to the start of the procedure. A Minimum amount of bleeding was controlled with Pressure. The procedure was tolerated well. Post Debridement Measurements: 0.3cm length x 0.4cm width x 0.1cm depth; 0.009cm^3 volume. Character of Wound/Ulcer Post Debridement is improved. Post procedure Diagnosis Wound #1: Same  as Pre-Procedure Plan Follow-up Appointments: Return appointment in 3 weeks. - Dr  Lady Gary Anesthetic: Wound #1 Left,Distal,Dorsal Foot: (In clinic) Topical Lidocaine 4% applied to wound bed Bathing/ Shower/ Hygiene: May shower and wash wound with soap and water. WOUND #1: - Foot Wound Laterality: Dorsal, Left, Distal Cleanser: Wound Cleanser 1 x Per Day/30 Days Discharge Instructions: Cleanse the wound with wound cleanser prior to applying a clean dressing using gauze sponges, not tissue or cotton balls. Cleanser: Byram Ancillary Kit - 15 Day Supply 1 x Per Day/30 Days Discharge Instructions: Use supplies as instructed; Kit contains: (15) Saline Bullets; (15) 3x3 Gauze; 15 pr Gloves Prim Dressing: Santyl Ointment 1 x Per Day/30 Days ary Discharge Instructions: Apply nickel thick amount to wound bed as instructed Secondary Dressing: Woven Gauze Sponge, Non-Sterile 4x4 in 1 x Per Day/30 Days Discharge Instructions: Apply over primary dressing as directed. Secured With: Coban Self-Adherent Wrap 4x5 (in/yd) 1 x Per Day/30 Days Discharge Instructions: Secure with Coban as directed. Secured With: [employer], 4.5x3.1 (in/yd) (Generic) 1 x Per Day/30 Days Discharge Instructions: Secure with Kerlix as directed. Secured With: Paper T ape, 2x10 (in/yd) 1 x Per Day/30 Days Discharge Instructions: Secure dressing with tape as directed. 09/25/2023: The distal dorsal foot wound is considerably smaller again today. There is some eschar and slough buildup. There is no significant depth to the site any longer I debrided eschar, slough, and subcutaneous tissue from the wound using a curette. We will continue Santyl dressing changes. He will follow-up in 3 weeks, due to clinic scheduling and provider availability. I anticipate the wound will likely be completely closed at that time. LANNIE, MOGUL ([id number]) [id number] Page 7 of 8 Electronic Signature(s) Signed: 09/25/2023 4:43:21 PM By: [name] MD FACS Entered By: [name] on  09/25/2023 16:43:21 -------------------------------------------------------------------------------- HxROS Details Patient Name: Date of Service: [name] 09/25/2023 3:30 PM Medical Record Number: [id number] Patient Account Number: [id number] Date of Birth/Sex: Treating RN: 17-Dec-1985 (37 y.o. M) Primary Care Provider: Sanda Gross Other Clinician: Referring Provider: Treating Provider/Extender: Royston Sinner in Treatment: 8 Information Obtained From Patient Chart Ear/Nose/Mouth/Throat Medical History: Negative for: Chronic sinus problems/congestion; Middle ear problems Past Medical History Notes: seasonal alllergies Respiratory Medical History: Positive for: Asthma Cardiovascular Medical History: Positive for: Hypertension; Peripheral Venous Disease Endocrine Medical History: Negative for: Type I Diabetes; Type II Diabetes Integumentary (Skin) Medical History: Negative for: History of Burn Past Medical History Notes: lichen simplex chronicus Psychiatric Medical History: Negative for: Anorexia/bulimia; Confinement Anxiety Immunizations Pneumococcal Vaccine: Received Pneumococcal Vaccination: No Implantable Devices No devices added Hospitalization / Surgery History Type of Hospitalization/Surgery right shoulder repair Family and Social History Cancer: No; Diabetes: No; Heart Disease: No; Hereditary Spherocytosis: No; Hypertension: Yes - Mother; Kidney Disease: No; Lung Disease: No; Seizures: No; Stroke: No; Thyroid Problems: No; Tuberculosis: No; Current every day smoker; Marital Status - Single; Alcohol Use: Rarely; Drug Use: Current History - TCH; Caffeine Use: Rarely; Financial Concerns: No; Food, Clothing or Shelter Needs: No; Support System Lacking: No; Transportation Concerns: No AVNEESH, SALONGA ([id number]) [id number] Page 8 of 8 Electronic Signature(s) Signed: 09/25/2023 4:46:47 PM By:  [name] MD FACS Entered By: [name] on 09/25/2023 16:37:45 -------------------------------------------------------------------------------- SuperBill Details Patient Name: Date of Service: [name] 09/25/2023 Medical Record Number: [id number] Patient Account Number: [id number] Date of Birth/Sex: Treating RN: [birth year] (37 y.o. 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 w Modifier: ithout evidence of ne Quantity: 1 crosis Physician Procedures : CPT4 Code Description Modifier [id number] - WC PHYS LEVEL 4 - EST PT ICD-10 Diagnosis Description L97.525 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

## 2023-09-26 NOTE — Progress Notes (Signed)
Luke Gross ([id number]) [id number] Page 1 of 7 Visit Report for 09/25/2023 Arrival Information Details Patient Name: Date of Service: [name] 09/25/2023 3:30 PM Medical Record Number: [id number] Patient Account Number: [id number] Date of Birth/Sex: Treating RN: April 04, 1986 (37 y.o. M) Primary Care Stanly Si: Sanda Linger Other Clinician: Referring Gerri Acre: Treating Breyonna Nault/Extender: Royston Sinner in Treatment: 8 Visit Information History Since Last Visit Added or deleted any medications: No Patient Arrived: Ambulatory Any new allergies or adverse reactions: No Arrival Time: 15:41 Had a fall or experienced change in No Accompanied By: self activities of daily living that may affect Transfer Assistance: None risk of falls: Patient Identification Verified: Yes Signs or symptoms of abuse/neglect since last visito No Secondary Verification Process Completed: Yes Hospitalized since last visit: No Patient Requires Transmission-Based Precautions: No Implantable device outside of the clinic excluding No Patient Has Alerts: No cellular tissue based products placed in the center since last visit: Has Dressing in Place as Prescribed: Yes Pain Present Now: No Electronic Signature(s) Signed: 09/26/2023 11:01:27 AM By: Thayer Dallas Entered By: Thayer Dallas on 09/25/2023 12:41:44 -------------------------------------------------------------------------------- Encounter Discharge Information Details Patient Name: Date of Service: [place] Luke Gross Luke L. 09/25/2023 3:30 PM Medical Record Number: [id number] Patient Account Number: [id number] Date of Birth/Sex: Treating RN: 06/26/1986 (37 y.o. Luke Gross Primary Care Thaer Miyoshi: Sanda Linger Other Clinician: Referring Janeece Blok: Treating Bernal Luhman/Extender: Royston Sinner in Treatment: 8 Encounter Discharge Information Items Post  Procedure Vitals Discharge Condition: Stable Temperature (F): 98.2 Ambulatory Status: Ambulatory Pulse (bpm): 68 Discharge Destination: Home Respiratory Rate (breaths/min): 18 Transportation: Private Auto Blood Pressure (mmHg): 137/84 Accompanied By: self Schedule Follow-up Appointment: Yes Clinical Summary of Care: Patient Declined Electronic Signature(s) Signed: 09/25/2023 4:04:30 PM By: Redmond Pulling RN, BSN Entered By: Redmond Pulling on 09/25/2023 13:03:13 Luke Gross ([id number]) [id number] Page 2 of 7 -------------------------------------------------------------------------------- Lower Extremity Assessment Details Patient Name: Date of Service: Luke Gross 09/25/2023 3:30 PM Medical Record Number: [id number] Patient Account Number: [id number] Date of Birth/Sex: Treating RN: Sep 13, 1986 (37 y.o. M) Primary Care Naisha Wisdom: Sanda Linger Other Clinician: Referring Daric Koren: Treating Katty Fretwell/Extender: Royston Sinner in Treatment: 8 Edema Assessment Assessed: [name]: No] [Right: No] Edema: [Left: N] [Right: o] Calf Left: Right: Point of Measurement: From Medial Instep 39.2 cm Ankle Left: Right: Point of Measurement: From Medial Instep 23 cm Vascular Assessment Extremity colors, hair growth, and conditions: Extremity Color: [Left:Normal] Hair Growth on Extremity: [Left:Yes] Temperature of Extremity: [Left:Warm] Capillary Refill: [Left:< 3 seconds] Dependent Rubor: [Left:No No] Electronic Signature(s) Signed: 09/26/2023 11:01:27 AM By: Thayer Dallas Entered By: Thayer Dallas on 09/25/2023 12:46:43 -------------------------------------------------------------------------------- Multi Wound Chart Details Patient Name: Date of Service: [place] Luke Gross Luke L. 09/25/2023 3:30 PM Medical Record Number: [id number] Patient Account Number: [id number] Date of Birth/Sex: Treating RN: November 29, 1985 (37 y.o.  M) Primary Care Rylei Masella: Sanda Linger Other Clinician: Referring Amea Mcphail: Treating Wynne Rozak/Extender: Royston Sinner in Treatment: 8 Vital Signs Height(in): 68 Pulse(bpm): 68 Weight(lbs): 207 Blood Pressure(mmHg): 137/84 Body Mass Index(BMI): 31.5 Temperature(F): 98.2 Respiratory Rate(breaths/min): 18 [1:Photos:] [N/A:N/A] Left, Distal, Dorsal Foot N/A N/A Wound Location: Gradually Appeared N/A N/A Wounding Event: Atypical N/A N/A Primary Etiology: Asthma, Hypertension, Peripheral N/A N/A Comorbid History: Venous Disease 05/19/2023 N/A N/A Date Acquired: 8 N/A N/A Weeks of Treatment: Open N/A N/A Wound Status: No N/A N/A Wound Recurrence: 0.3x0.4x0.1 N/A N/A Measurements L x W x D (cm) 0.094 N/A N/A A (cm) : rea 0.009 N/A  N/A Volume (cm) : 88.00% N/A N/A % Reduction in A rea: 97.70% N/A N/A % Reduction in Volume: Full Thickness With Exposed Support N/A N/A Classification: Structures Medium N/A N/A Exudate A mount: Serous N/A N/A Exudate Type: amber N/A N/A Exudate Color: Well defined, not attached N/A N/A Wound Margin: None Present (0%) N/A N/A Granulation A mount: Large (67-100%) N/A N/A Necrotic A mount: Eschar, Adherent Slough N/A N/A Necrotic Tissue: Fat Layer (Subcutaneous Tissue): Yes N/A N/A Exposed Structures: Fascia: No Tendon: No Muscle: No Joint: No Bone: No Medium (34-66%) N/A N/A Epithelialization: Debridement - Excisional N/A N/A Debridement: Pre-procedure Verification/Time Out 15:50 N/A N/A Taken: Lidocaine 4% Topical Solution N/A N/A Pain Control: Necrotic/Eschar, Subcutaneous, N/A N/A Tissue Debrided: Slough Skin/Subcutaneous Tissue N/A N/A Level: 0.09 N/A N/A Debridement A (sq cm): rea Curette N/A N/A Instrument: Minimum N/A N/A Bleeding: Pressure N/A N/A Hemostasis Achieved: Debridement Treatment Response: Procedure was tolerated well N/A N/A Post Debridement Measurements L x  0.3x0.4x0.1 N/A N/A W x D (cm) 0.009 N/A N/A Post Debridement Volume: (cm) No Abnormalities Noted N/A N/A Periwound Skin Texture: No Abnormalities Noted N/A N/A Periwound Skin Moisture: No Abnormalities Noted N/A N/A Periwound Skin Color: No Abnormality N/A N/A Temperature: Yes N/A N/A Tenderness on Palpation: Debridement N/A N/A Procedures Performed: Treatment Notes Wound #1 (Foot) Wound Laterality: Dorsal, Left, Distal Cleanser Wound Cleanser Discharge Instruction: Cleanse the wound with wound cleanser prior to applying a clean dressing using gauze sponges, not tissue or cotton balls. Byram Ancillary Kit - 15 Day Supply Discharge Instruction: Use supplies as instructed; Kit contains: (15) Saline Bullets; (15) 3x3 Gauze; 15 pr Gloves Peri-Wound Care Topical Primary Dressing Santyl Ointment Discharge Instruction: Apply nickel thick amount to wound bed as instructed Secondary Dressing Woven Gauze Sponge, Non-Sterile 4x4 in Discharge Instruction: Apply over primary dressing as directed. Secured With [employer] 4x5 (in/yd) Discharge Instruction: Secure with Coban as directed. Luke Gross, Luke Gross ([id number]) [id number] Page 4 of 7 Kerlix Roll Sterile, 4.5x3.1 (in/yd) Discharge Instruction: Secure with Kerlix as directed. Paper Tape, 2x10 (in/yd) Discharge Instruction: Secure dressing with tape as directed. Compression Wrap Compression Stockings Add-Ons Electronic Signature(s) Signed: 09/25/2023 4:36:56 PM By: [name] MD FACS Entered By: [name] on 09/25/2023 13:36:56 -------------------------------------------------------------------------------- Multi-Disciplinary Care Plan Details Patient Name: Date of Service: [place] Luke Gross Luke L. 09/25/2023 3:30 PM Medical Record Number: [id number] Patient Account Number: [id number] Date of Birth/Sex: Treating RN: Mar 29, 1986 (37 y.o. Luke Gross Primary Care  Anslie Spadafora: Sanda Linger Other Clinician: Referring Hydeia Mcatee: Treating Kenlynn Houde/Extender: Royston Sinner in Treatment: 8 Multidisciplinary Care Plan reviewed with physician Active Inactive Wound/Skin Impairment Nursing Diagnoses: Impaired tissue integrity Knowledge deficit related to ulceration/compromised skin integrity Goals: Patient/caregiver will verbalize understanding of skin care regimen Date Initiated: 07/31/2023 Target Resolution Date: 09/28/2023 Goal Status: Active Ulcer/skin breakdown will have a volume reduction of 30% by week 4 Date Initiated: 07/31/2023 Target Resolution Date: 09/28/2023 Goal Status: Active Interventions: Assess patient/caregiver ability to obtain necessary supplies Assess patient/caregiver ability to perform ulcer/skin care regimen upon admission and as needed Assess ulceration(s) every visit Provide education on smoking Provide education on ulcer and skin care Treatment Activities: Referred to DME Luke Gross for dressing supplies : 07/31/2023 Skin care regimen initiated : 07/31/2023 Topical wound management initiated : 07/31/2023 Notes: Electronic Signature(s) Signed: 09/25/2023 4:04:30 PM By: Redmond Pulling RN, BSN Entered By: Redmond Pulling on 09/25/2023 13:01:39 Luke Gross ([id number]) [id number] Page 5 of 7 -------------------------------------------------------------------------------- Pain Assessment Details Patient Name: Date of Service:  [place] Luke Gross Luke L. 09/25/2023 3:30 PM Medical Record Number: [id number] Patient Account Number: [id number] Date of Birth/Sex: Treating RN: 1986-04-04 (37 y.o. M) Primary Care Keiko Myricks: Sanda Linger Other Clinician: Referring Kaydra Borgen: Treating Mikaylee Arseneau/Extender: Royston Sinner in Treatment: 8 Active Problems Location of Pain Severity and Description of Pain Patient Has Paino No Site Locations Pain Management and  Medication Current Pain Management: Electronic Signature(s) Signed: 09/26/2023 11:01:27 AM By: Thayer Dallas Entered By: Thayer Dallas on 09/25/2023 12:44:01 -------------------------------------------------------------------------------- Patient/Caregiver Education Details Patient Name: Date of Service: [place] Luke Gross 11/21/2024andnbsp3:30 PM Medical Record Number: [id number] Patient Account Number: [id number] Date of Birth/Gender: Treating RN: [birth year] (37 y.o. Tammy Sours Primary Care Physician: Sanda Linger Other Clinician: Referring Physician: Treating Physician/Extender: Royston Sinner in Treatment: 8 Education Assessment Education Provided To: Patient Education Topics Provided Wound/Skin Impairment: Handouts: Caring for Your Ulcer Methods: Explain/Verbal Responses: Reinforcements needed, State content correctly Luke Gross, Luke Gross ([id number]) [phone].pdf Page 6 of 7 Electronic Signature(s) Signed: 09/25/2023 4:22:55 PM By: Shawn Stall RN, BSN Entered By: Shawn Stall on 09/25/2023 12:48:49 -------------------------------------------------------------------------------- Wound Assessment Details Patient Name: Date of Service: [place] Luke Gross Luke L. 09/25/2023 3:30 PM Medical Record Number: [id number] Patient Account Number: [id number] Date of Birth/Sex: Treating RN: January 01, 1986 (37 y.o. M) Primary Care Legrand Lasser: Sanda Linger Other Clinician: Referring Naraya Stoneberg: Treating Donterrius Santucci/Extender: Royston Sinner in Treatment: 8 Wound Status Wound Number: 1 Primary Etiology: Atypical Wound Location: Left, Distal, Dorsal Foot Wound Status: Open Wounding Event: Gradually Appeared Comorbid History: Asthma, Hypertension, Peripheral Venous Disease Date Acquired: 05/19/2023 Weeks Of Treatment: 8 Clustered Wound: No Photos Wound Measurements Length: (cm) Width: (cm) Depth:  (cm) Area: (cm) Volume: (cm) 0.3 % Reduction in Area: 88% 0.4 % Reduction in Volume: 97.7% 0.1 Epithelialization: Medium (34-66%) 0.094 Tunneling: No 0.009 Undermining: No Wound Description Classification: Full Thickness With Exposed Suppor Wound Margin: Well defined, not attached Exudate Amount: Medium Exudate Type: Serous Exudate Color: amber t Structures Foul Odor After Cleansing: No Slough/Fibrino Yes Wound Bed Granulation Amount: None Present (0%) Exposed Structure Necrotic Amount: Large (67-100%) Fascia Exposed: No Necrotic Quality: Eschar, Adherent Slough Fat Layer (Subcutaneous Tissue) Exposed: Yes Tendon Exposed: No Muscle Exposed: No Joint Exposed: No Bone Exposed: No Periwound Skin Texture Texture Color No Abnormalities Noted: Yes No Abnormalities Noted: Yes Moisture Temperature / Pain No Abnormalities Noted: Yes Temperature: No Abnormality Luke Gross, Luke Gross ([id number]) [id number] Page 7 of 7 Tenderness on Palpation: Yes Treatment Notes Wound #1 (Foot) Wound Laterality: Dorsal, Left, Distal Cleanser Wound Cleanser Discharge Instruction: Cleanse the wound with wound cleanser prior to applying a clean dressing using gauze sponges, not tissue or cotton balls. Byram Ancillary Kit - 15 Day Supply Discharge Instruction: Use supplies as instructed; Kit contains: (15) Saline Bullets; (15) 3x3 Gauze; 15 pr Gloves Peri-Wound Care Topical Primary Dressing Santyl Ointment Discharge Instruction: Apply nickel thick amount to wound bed as instructed Secondary Dressing Woven Gauze Sponge, Non-Sterile 4x4 in Discharge Instruction: Apply over primary dressing as directed. Secured With [employer] 4x5 (in/yd) Discharge Instruction: Secure with Coban as directed. Kerlix Roll Sterile, 4.5x3.1 (in/yd) Discharge Instruction: Secure with Kerlix as directed. Paper Tape, 2x10 (in/yd) Discharge Instruction: Secure dressing with tape  as directed. Compression Wrap Compression Stockings Add-Ons Electronic Signature(s) Signed: 09/26/2023 11:01:27 AM By: Thayer Dallas Entered By: Thayer Dallas on 09/25/2023 12:49:05 -------------------------------------------------------------------------------- Vitals Details Patient Name: Date of Service: [place] Luke Gross Luke L. 09/25/2023 3:30 PM Medical Record Number: [id number] Patient Account Number:  [id number] Date of Birth/Sex: Treating RN: Apr 05, 1986 (37 y.o. M) Primary Care Udell Blasingame: Sanda Linger Other Clinician: Referring Evony Rezek: Treating Takela Varden/Extender: Royston Sinner in Treatment: 8 Vital Signs Time Taken: 15:41 Temperature (F): 98.2 Height (in): 68 Pulse (bpm): 68 Weight (lbs): 207 Respiratory Rate (breaths/min): 18 Body Mass Index (BMI): 31.5 Blood Pressure (mmHg): 137/84 Reference Range: 80 - 120 mg / dl Electronic Signature(s) Signed: 09/26/2023 11:01:27 AM By: Thayer Dallas Entered By: Thayer Dallas on 09/25/2023 12:43:50

## 2023-10-13 ENCOUNTER — Ambulatory Visit: Payer: BC Managed Care – PPO | Admitting: Podiatry

## 2023-10-22 ENCOUNTER — Encounter (HOSPITAL_BASED_OUTPATIENT_CLINIC_OR_DEPARTMENT_OTHER): Payer: BC Managed Care – PPO | Attending: General Surgery | Admitting: General Surgery

## 2023-10-22 DIAGNOSIS — I872 Venous insufficiency (chronic) (peripheral): Secondary | ICD-10-CM | POA: Insufficient documentation

## 2023-10-22 DIAGNOSIS — Z872 Personal history of diseases of the skin and subcutaneous tissue: Secondary | ICD-10-CM | POA: Insufficient documentation

## 2023-10-22 DIAGNOSIS — Z09 Encounter for follow-up examination after completed treatment for conditions other than malignant neoplasm: Secondary | ICD-10-CM | POA: Diagnosis present

## 2023-10-22 NOTE — Progress Notes (Signed)
Luke Gross, Luke Gross ([id number]) [phone].pdf Page 1 of 5 Visit Report for 10/22/2023 Chief Complaint Document Details Patient Name: Date of Service: Luke Gross 10/22/2023 8:15 A Gross Medical Record Number: [id number] Patient Account Number: [id number] Date of Birth/Sex: Treating RN: 02-01-1986 (37 y.o. Gross) Primary Care Provider: Sanda Linger Other Clinician: Referring Provider: Treating Provider/Extender: Royston Sinner in Treatment: 11 Information Obtained from: Patient Chief Complaint Patient seen for complaints of Non-Healing Wound on foot, in setting of venous insufficiency Electronic Signature(s) Signed: 10/22/2023 11:33:48 AM By: [name] MD FACS Entered By: [name] on 10/22/2023 09:06:59 -------------------------------------------------------------------------------- HPI Details Patient Name: Date of Service: Luke Gross Medical Record Number: [id number] Patient Account Number: [id number] Date of Birth/Sex: Treating RN: [birth year] (37 y.o. 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 He has been tested for sickle cell disease and does not have it. This has been a recurrent issue for several years now. He has primarily been followed by podiatry. They obtained vascular studies in 2022. ABI results are copied here: +-------+-----------+-----------+------------+------------+ ABI/TBIT oday's ABIT oday's TBIPrevious ABIPrevious TBI +-------+-----------+-----------+------------+------------+ Right 1.22 0.83    +-------+-----------+-----------+------------+------------+ Left 1.26 0.74     +-------+-----------+-----------+------------+------------+ Venous reflux testing demonstrated reflux in the greater saphenous vein in the left proximal thigh, mid thigh, distal thigh, knee, and proximal calf. I do not see that he was ever referred to vascular surgery to address his venous insufficiency. He last saw podiatry 2 days ago. Apparently they dressed it with Betadine but then recommended that he apply Santyl, which he has been doing. 08/08/2023: He has a new wound on the dorsal aspect of his left foot, proximal to where the existing wound is. There is slough on the surface, but it does not appear to penetrate beyond the fat layer. The existing wound still has some tunneling and thick slough accumulation on the surface, but once the slough was removed, there appears to be granulation tissue closing over the exposed muscle. He has not received an appointment to meet with vascular surgery regarding his venous reflux. 08/14/2023: The dorsal foot wound is a little bit larger today. The more distal foot wound is about the same size. Both have significant slough on their surfaces. 08/22/2023: The dorsal foot wound is about the same size, but the more distal foot wound is smaller with less undermining. Both have slough accumulation present. 09/05/2023: Both wounds are little bit smaller today and shallower, as well. There is slough and eschar accumulation. He has an appointment with vascular surgery next Tuesday to discuss possible saphenous vein ablation. 09/19/2023: The proximal dorsal wound is healed. The distal wound is substantially smaller and no longer has any cavity or undermining. There is some eschar accumulation on the surface. Vascular surgery did not think endovenous ablation would be beneficial. Luke Gross, Luke Gross.pdf Page 2 of 5 09/25/2023: The distal dorsal foot wound is considerably smaller again today. There is some eschar and  slough buildup. There is no significant depth to the site any longer. 10/22/2023: His wounds are healed. Electronic Signature(s) Signed: 10/22/2023 11:33:48 AM By: [name] MD FACS Entered By: [name] on 10/22/2023 09:07:13 -------------------------------------------------------------------------------- Physical Exam Details Patient Name: Date of Service: Luke Gross  Medical Record Number: [id number] Patient Account Number: [id number] Date of Birth/Sex: Treating RN: 1986/06/04 (37 y.o. Gross) Primary Care Provider: Sanda Linger Other Clinician: Referring Provider: Treating Provider/Extender: Royston Sinner in Treatment: 11 Constitutional Slightly hypertensive. . . . no acute distress. Respiratory Normal work of breathing on room air.. Notes 10/22/2023: His wounds are healed. Electronic Signature(s) Signed: 10/22/2023 11:33:48 AM By: [name] MD FACS Entered By: [name] on 10/22/2023 09:07:46 -------------------------------------------------------------------------------- Physician Orders Details Patient Name: Date of Service: Luke Gross Medical Record Number: [id number] Patient Account Number: [id number] Date of Birth/Sex: Treating RN: January 03, 1986 (37 y.o. 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 I87.2 Venous insufficiency (chronic) (peripheral) Discharge From [facility] Discharge from [facility] Electronic  Signature(s) Signed: 10/22/2023 11:33:48 AM By: [name] MD FACS Luke Gross, Luke Gross ([id number]) [id number] Page 3 of 5 Entered By: [name] on 10/22/2023 09:07:54 -------------------------------------------------------------------------------- Problem List Details Patient Name: Date of Service: Luke Gross 10/22/2023 8:15 A Gross Medical Record Number: [id number] Patient Account Number: [id number] Date of Birth/Sex: Treating RN: 11-May-1986 (37 y.o. 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 07/31/2023 No Yes without evidence of necrosis I87.2 Venous insufficiency (chronic) (peripheral) 07/31/2023 No Yes Inactive Problems ICD-10 Code Description Active Date Inactive Date L97.522 Non-pressure chronic ulcer of other part of left foot with fat layer exposed 08/08/2023 08/08/2023 Resolved Problems Electronic Signature(s) Signed: 10/22/2023 11:33:48 AM By: [name] MD FACS Entered By: [name] on 10/22/2023 09:05:34 -------------------------------------------------------------------------------- Progress Note Details Patient Name: Date of Service: Luke Gross Medical Record Number: [id number] Patient Account Number: [id number] Date of Birth/Sex: Treating RN: 1986/07/02 (37 y.o. 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 He has been tested for sickle cell disease and does not have it. This has been a recurrent issue for several years now. He has primarily been followed by podiatry. They obtained vascular studies in 2022. ABI results are copied here: +-------+-----------+-----------+------------+------------+ ABI/TBIT oday's ABIT oday's TBIPrevious ABIPrevious TBI +-------+-----------+-----------+------------+------------+ Right 1.22 0.83    +-------+-----------+-----------+------------+------------+ Left 1.26 0.74    +-------+-----------+-----------+------------+------------+ Venous reflux testing demonstrated reflux in the greater saphenous vein in the left proximal thigh, mid thigh, distal thigh, knee, and proximal calf. I do not see that he was ever referred to vascular surgery to address his venous insufficiency. He last saw podiatry 2 days ago. Apparently they dressed it with Betadine but then recommended that he apply Santyl, which he has been doing. 08/08/2023: He has a new wound on  the dorsal aspect of his left foot, proximal to where the existing wound is. There is slough on the surface, but it does not appear to penetrate beyond the fat layer. The existing wound still has some tunneling and thick slough accumulation on the surface, but once the slough was removed, there appears to be granulation tissue closing over the exposed muscle. He has not received an appointment to meet with vascular surgery regarding his venous reflux. 08/14/2023: The dorsal foot wound is a little bit larger today. The more distal foot wound is about the same size. Both have significant slough on their surfaces. 08/22/2023: The dorsal foot wound is about the same size, but the  more distal foot wound is smaller with less undermining. Both have slough accumulation present. 09/05/2023: Both wounds are little bit smaller today and shallower, as well. There is slough and eschar accumulation. He has an appointment with vascular surgery next Tuesday to discuss possible saphenous vein ablation. 09/19/2023: The proximal dorsal wound is healed. The distal wound is substantially smaller and no longer has any cavity or undermining. There is some eschar accumulation on the surface. Vascular surgery did not think endovenous ablation would be beneficial. 09/25/2023: The distal dorsal foot wound is considerably smaller again today. There is some eschar and slough buildup. There is no significant depth to the site any longer. 10/22/2023: His wounds are healed. Objective Constitutional Slightly hypertensive. no acute distress. Vitals Time Taken: 8:30 AM, Height: 68 in, Weight: 207 lbs, BMI: 31.5, Temperature: 98.2 F, Pulse: 61 bpm, Respiratory Rate: 18 breaths/min, Blood Pressure: 144/85 mmHg. Respiratory Normal work of breathing on room air.. General Notes: 10/22/2023: His wounds are healed. Integumentary (Hair, Skin) Wound #1 status is Open. Original cause of wound was Gradually Appeared. The date acquired was: 05/19/2023. The wound has been in treatment 11 weeks. The wound is located on the Left,Distal,Dorsal Foot. The wound measures 0cm length x 0cm width x 0cm depth; 0cm^2 area and 0cm^3 volume. There is no tunneling or undermining noted. There is a none present amount of drainage noted. There is no granulation within the wound bed. There is no necrotic tissue within the wound bed. The periwound skin appearance had no abnormalities noted for texture. The periwound skin appearance had no abnormalities noted for moisture. The periwound skin appearance had no abnormalities noted for color. Periwound temperature was noted as No Abnormality. 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 We will discharge him from the wound care center. He may follow-up in the future, should the need arise. Electronic Signature(s) Signed: 10/22/2023 11:33:48 AM By: [name] MD FACS Entered By: [name] on 10/22/2023 09:08:55 -------------------------------------------------------------------------------- SuperBill Details Patient Name: Date of Service: Luke [name] PHER L. 10/22/2023 Medical Record Number: [id number] Patient Account Number: [id number] Date of Birth/Sex: Treating RN: [birth year] (37 y.o. 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

## 2023-10-23 NOTE — Progress Notes (Signed)
Luke Gross ([id number]) [phone].pdf Page 1 of 8 Visit Report for 10/22/2023 Arrival Information Details Patient Name: Date of Service: [name] 10/22/2023 8:15 A M Medical Record Number: [id number] Patient Account Number: [id number] Date of Birth/Sex: Treating RN: 1986-07-22 (37 y.o. M) Primary Care Tyshay Adee: Sanda Linger Other Clinician: Referring Dashiell Franchino: Treating Adain Geurin/Extender: Royston Sinner in Treatment: 11 Visit Information History Since Last Visit Added or deleted any medications: No Patient Arrived: Ambulatory Any new allergies or adverse reactions: No Arrival Time: 08:30 Had a fall or experienced change in No Accompanied By: self activities of daily living that may affect Transfer Assistance: None risk of falls: Patient Identification Verified: Yes Signs or symptoms of abuse/neglect since last visito No Secondary Verification Process Completed: Yes Hospitalized since last visit: No Patient Requires Transmission-Based Precautions: No Implantable device outside of the clinic excluding No Patient Has Alerts: No cellular tissue based products placed in the center since last visit: Has Dressing in Place as Prescribed: No Pain Present Now: No Electronic Signature(s) Signed: 10/22/2023 5:22:11 PM By: Zenaida Deed RN, BSN Entered By: Zenaida Deed on 10/22/2023 08:36:07 -------------------------------------------------------------------------------- Clinic Level of Care Assessment Details Patient Name: Date of Service: [place] [name] [facility] L. 10/22/2023 8:15 A M Medical Record Number: [id number] Patient Account Number: [id number] Date of Birth/Sex: Treating RN: 1986-07-03 (37 y.o. Luke Gross Primary Care Rooney Swails: Sanda Linger Other Clinician: Referring Tahji St. Stephens: Treating Kaycen Whitworth/Extender: Royston Sinner in Treatment: 11 Clinic Level of Care Assessment  Items TOOL 4 Quantity Score []  - 0 Use when only an EandM is performed on FOLLOW-UP visit ASSESSMENTS - Nursing Assessment / Reassessment X- 1 10 Reassessment of Co-morbidities (includes updates in patient status) X- 1 5 Reassessment of Adherence to Treatment Plan ASSESSMENTS - Wound and Skin A ssessment / Reassessment X - Simple Wound Assessment / Reassessment - one wound 1 5 []  - 0 Complex Wound Assessment / Reassessment - multiple wounds []  - 0 Dermatologic / Skin Assessment (not related to wound area) ASSESSMENTS - Focused Assessment []  - 0 Circumferential Edema Measurements - multi extremities []  - 0 Nutritional Assessment / Counseling / Intervention TREYVONNE, FELIPE ([id number]) [phone].pdf Page 2 of 8 X- 1 5 Lower Extremity Assessment (monofilament, tuning fork, pulses) []  - 0 Peripheral Arterial Disease Assessment (using hand held doppler) ASSESSMENTS - Ostomy and/or Continence Assessment and Care []  - 0 Incontinence Assessment and Management []  - 0 Ostomy Care Assessment and Management (repouching, etc.) PROCESS - Coordination of Care X - Simple Patient / Family Education for ongoing care 1 15 []  - 0 Complex (extensive) Patient / Family Education for ongoing care X- 1 10 Staff obtains Chiropractor, Records, T Results / Process Orders est []  - 0 Staff telephones HHA, Nursing Homes / Clarify orders / etc []  - 0 Routine Transfer to another Facility (non-emergent condition) []  - 0 Routine Hospital Admission (non-emergent condition) []  - 0 New Admissions / Manufacturing engineer / Ordering NPWT Apligraf, etc. , []  - 0 Emergency Hospital Admission (emergent condition) X- 1 10 Simple Discharge Coordination []  - 0 Complex (extensive) Discharge Coordination PROCESS - Special Needs []  - 0 Pediatric / Minor Patient Management []  - 0 Isolation Patient Management []  - 0 Hearing / Language / Visual special needs []  - 0 Assessment of  Community assistance (transportation, D/C planning, etc.) []  - 0 Additional assistance / Altered mentation []  - 0 Support Surface(s) Assessment (bed, cushion, seat, etc.) INTERVENTIONS - Wound Cleansing / Measurement X - Simple  Wound Cleansing - one wound 1 5 []  - 0 Complex Wound Cleansing - multiple wounds X- 1 5 Wound Imaging (photographs - any number of wounds) []  - 0 Wound Tracing (instead of photographs) X- 1 5 Simple Wound Measurement - one wound []  - 0 Complex Wound Measurement - multiple wounds INTERVENTIONS - Wound Dressings []  - 0 Small Wound Dressing one or multiple wounds []  - 0 Medium Wound Dressing one or multiple wounds []  - 0 Large Wound Dressing one or multiple wounds []  - 0 Application of Medications - topical []  - 0 Application of Medications - injection INTERVENTIONS - Miscellaneous []  - 0 External ear exam []  - 0 Specimen Collection (cultures, biopsies, blood, body fluids, etc.) []  - 0 Specimen(s) / Culture(s) sent or taken to Lab for analysis []  - 0 Patient Transfer (multiple staff / Nurse, adult / Similar devices) []  - 0 Simple Staple / Suture removal (25 or less) []  - 0 Complex Staple / Suture removal (26 or more) []  - 0 Hypo / Hyperglycemic Management (close monitor of Blood Glucose) Rudge, Hernan L ([id number]) [id number].pdf Page 3 of 8 []  - 0 Ankle / Brachial Index (ABI) - do not check if billed separately X- 1 5 Vital Signs Has the patient been seen at the hospital within the last three years: Yes Total Score: 80 Level Of Care: New/Established - Level 3 Electronic Signature(s) Signed: 10/22/2023 5:22:11 PM By: Zenaida Deed RN, BSN Entered By: Zenaida Deed on 10/22/2023 08:47:36 -------------------------------------------------------------------------------- Encounter Discharge Information Details Patient Name: Date of Service: [name] 10/22/2023 8:15 A M Medical Record Number:  [id number] Patient Account Number: [id number] Date of Birth/Sex: Treating RN: 1986-06-13 (37 y.o. Luke Gross Primary Care [place]vada Seaborn: Sanda Linger Other Clinician: Referring Jorja Empie: Treating Hattie Pine/Extender: Royston Sinner in Treatment: 11 Encounter Discharge Information Items Discharge Condition: Stable Ambulatory Status: Ambulatory Discharge Destination: Home Transportation: Private Auto Accompanied By: self Schedule Follow-up Appointment: Yes Clinical Summary of Care: Patient Declined Electronic Signature(s) Signed: 10/22/2023 5:22:11 PM By: Zenaida Deed RN, BSN Entered By: Zenaida Deed on 10/22/2023 08:48:16 -------------------------------------------------------------------------------- Lower Extremity Assessment Details Patient Name: Date of Service: [name] 10/22/2023 8:15 A M Medical Record Number: [id number] Patient Account Number: [id number] Date of Birth/Sex: Treating RN: 15-Jul-1986 (37 y.o. Luke Gross Primary Care Januel Doolan: Sanda Linger Other Clinician: Referring Harlon Kutner: Treating Ambry Dix/Extender: Royston Sinner in Treatment: 11 Edema Assessment Assessed: [name]: No] [name]: No] Edema: [Left: N] [Right: o] Calf Left: Right: Point of Measurement: From Medial Instep 39.2 cm Ankle Left: Right: Point of Measurement: From Medial Instep 23 cm Vascular Assessment DELONTAY, MULANAX L ([id number]) [Right:[id number] Page 4 of 8] Pulses: Dorsalis Pedis Palpable: [Left:Yes] Extremity colors, hair growth, and conditions: Extremity Color: [Left:Normal] Hair Growth on Extremity: [Left:Yes] Temperature of Extremity: [Left:Warm] Capillary Refill: [Left:< 3 seconds] Dependent Rubor: [Left:No No] Electronic Signature(s) Signed: 10/22/2023 5:22:11 PM By: Zenaida Deed RN, BSN Entered By: Zenaida Deed on 10/22/2023  08:37:18 -------------------------------------------------------------------------------- Multi Wound Chart Details Patient Name: Date of Service: [name] 10/22/2023 8:15 A M Medical Record Number: [id number] Patient Account Number: [id number] Date of Birth/Sex: Treating RN: [birth year] (37 y.o. M) Primary Care Makalya Nave: Sanda Linger Other Clinician: Referring Alayziah Tangeman: Treating Winnie Umali/Extender: Royston Sinner in Treatment: 11 Vital Signs Height(in): 68 Pulse(bpm): 61 Weight(lbs): 207 Blood Pressure(mmHg): 144/85 Body Mass Index(BMI): 31.5 Temperature(F): 98.2 Respiratory Rate(breaths/min): 18 [1:Photos:] [N/A:N/A] Left, Distal, Dorsal Foot N/A N/A Wound Location: Gradually Appeared N/A  N/A Wounding Event: Atypical N/A N/A Primary Etiology: Asthma, Hypertension, Peripheral N/A N/A Comorbid History: Venous Disease 05/19/2023 N/A N/A Date Acquired: 11 N/A N/A Weeks of Treatment: Open N/A N/A Wound Status: No N/A N/A Wound Recurrence: 0x0x0 N/A N/A Measurements L x W x D (cm) 0 N/A N/A A (cm) : rea 0 N/A N/A Volume (cm) : 100.00% N/A N/A % Reduction in Area: 100.00% N/A N/A % Reduction in Volume: Full Thickness With Exposed Support N/A N/A Classification: Structures None Present N/A N/A Exudate Amount: None Present (0%) N/A N/A Granulation Amount: None Present (0%) N/A N/A Necrotic Amount: Fascia: No N/A N/A Exposed Structures: Fat Layer (Subcutaneous Tissue): No Tendon: No Muscle: No Joint: No Bone: No Large (67-100%) N/A N/A Epithelialization: No Abnormalities Noted N/A N/A Periwound Skin TextureLADELL, APOLONIO ([id number]) [id number] Page 5 of 8 No Abnormalities Noted N/A N/A Periwound Skin Moisture: No Abnormalities Noted N/A N/A Periwound Skin Color: No Abnormality N/A N/A Temperature: Treatment Notes Wound #1 (Foot) Wound Laterality: Dorsal, Left,  Distal Cleanser Peri-Wound Care Topical Primary Dressing Secondary Dressing Secured With Compression Wrap Compression Stockings Add-Ons Electronic Signature(s) Signed: 10/22/2023 11:33:48 AM By: [name] MD FACS Entered By: [name] on 10/22/2023 09:06:54 -------------------------------------------------------------------------------- Multi-Disciplinary Care Plan Details Patient Name: Date of Service: [name] 10/22/2023 8:15 A M Medical Record Number: [id number] Patient Account Number: [id number] Date of Birth/Sex: Treating RN: February 27, 1986 (37 y.o. Luke Gross Primary Care Dallys Nowakowski: Sanda Linger Other Clinician: Referring Lucrecia Mcphearson: Treating Conda Wannamaker/Extender: Royston Sinner in Treatment: 11 Multidisciplinary Care Plan reviewed with physician Active Inactive Electronic Signature(s) Signed: 10/22/2023 5:22:11 PM By: Zenaida Deed RN, BSN Entered By: Zenaida Deed on 10/22/2023 08:41:22 -------------------------------------------------------------------------------- Pain Assessment Details Patient Name: Date of Service: [name] 10/22/2023 8:15 A M Medical Record Number: [id number] Patient Account Number: [id number] Date of Birth/Sex: Treating RN: [birth year] (37 y.o. M) Primary Care Joanette Silveria: Sanda Linger Other Clinician: Referring Madicyn Mesina: Treating Aiko Belko/Extender: Royston Sinner in Treatment: [address] FAYEZ, BILLIPS ([id number]) [id number] Page 6 of 8 Location of Pain Severity and Description of Pain Patient Has Paino No Site Locations Rate the pain. Current Pain Level: 0 Pain Management and Medication Current Pain Management: Electronic Signature(s) Signed: 10/22/2023 5:22:11 PM By: Zenaida Deed RN, BSN Entered By: Zenaida Deed on 10/22/2023  08:36:35 -------------------------------------------------------------------------------- Patient/Caregiver Education Details Patient Name: Date of Service: MA Luke Gross 12/18/2024andnbsp8:15 A M Medical Record Number: [id number] Patient Account Number: [id number] Date of Birth/Gender: Treating RN: [birth year] (37 y.o. Luke Gross Primary Care Physician: Sanda Linger Other Clinician: Referring Physician: Treating Physician/Extender: Royston Sinner in Treatment: 11 Education Assessment Education Provided To: Patient Education Topics Provided Wound/Skin Impairment: Methods: Explain/Verbal Responses: Reinforcements needed, State content correctly [employer]) Signed: 10/22/2023 5:22:11 PM By: Zenaida Deed RN, BSN Entered By: Zenaida Deed on 10/22/2023 08:41:48 -------------------------------------------------------------------------------- Wound Assessment Details Patient Name: Date of Service: [name] 10/22/2023 8:15 A Sheron Nightingale, Tanna Savoy ([id number]) [id number] Page 7 of 8 Medical Record Number: [id number] Patient Account Number: [id number] Date of Birth/Sex: Treating RN: 1986-08-23 (37 y.o. Luke Gross Primary Care Tniya Bowditch: Sanda Linger Other Clinician: Referring Aryam Zhan: Treating Kionte Baumgardner/Extender: Royston Sinner in Treatment: 11 Wound Status Wound Number: 1 Primary Etiology: Atypical Wound Location: Left, Distal, Dorsal Foot Wound Status: Open Wounding Event: Gradually Appeared Comorbid History: Asthma, Hypertension, Peripheral Venous Disease Date Acquired: 05/19/2023 Weeks Of Treatment: 11 Clustered Wound: No  Photos Wound Measurements Length: (cm) Width: (cm) Depth: (cm) Area: (cm) Volume: (cm) 0 % Reduction in Area: 100% 0 % Reduction in Volume: 100% 0 Epithelialization: Large (67-100%) 0 Tunneling: No 0 Undermining:  No Wound Description Classification: Full Thickness With Exposed Suppor Exudate Amount: None Present t Structures Foul Odor After Cleansing: No Slough/Fibrino No Wound Bed Granulation Amount: None Present (0%) Exposed Structure Necrotic Amount: None Present (0%) Fascia Exposed: No Fat Layer (Subcutaneous Tissue) Exposed: No Tendon Exposed: No Muscle Exposed: No Joint Exposed: No Bone Exposed: No Periwound Skin Texture Texture Color No Abnormalities Noted: Yes No Abnormalities Noted: Yes Moisture Temperature / Pain No Abnormalities Noted: Yes Temperature: No Abnormality Electronic Signature(s) Signed: 10/22/2023 5:22:11 PM By: Zenaida Deed RN, BSN Entered By: Zenaida Deed on 10/22/2023 08:38:35 -------------------------------------------------------------------------------- Vitals Details Patient Name: Date of Service: [name] 10/22/2023 8:15 A M Medical Record Number: [id number] Patient Account Number: [id number] Date of Birth/Sex: Treating RN: 03-Mar-1986 (37 y.o. M) Primary Care Jaki Hammerschmidt: Sanda Linger Other Clinician: DEQUANE, GOBEIL ([id number]) [id number] Page 8 of 8 Referring Aithan Farrelly: Treating Jacyln Carmer/Extender: Royston Sinner in Treatment: 11 Vital Signs Time Taken: 08:30 Temperature (F): 98.2 Height (in): 68 Pulse (bpm): 61 Weight (lbs): 207 Respiratory Rate (breaths/min): 18 Body Mass Index (BMI): 31.5 Blood Pressure (mmHg): 144/85 Reference Range: 80 - 120 mg / dl Electronic Signature(s) Signed: 10/22/2023 5:22:11 PM By: Zenaida Deed RN, BSN Entered By: Zenaida Deed on 10/22/2023 08:65:78

## 2023-11-18 ENCOUNTER — Other Ambulatory Visit: Payer: Self-pay | Admitting: Internal Medicine

## 2023-11-18 DIAGNOSIS — J4530 Mild persistent asthma, uncomplicated: Secondary | ICD-10-CM

## 2023-11-19 ENCOUNTER — Other Ambulatory Visit: Payer: Self-pay | Admitting: Internal Medicine

## 2023-11-19 DIAGNOSIS — I10 Essential (primary) hypertension: Secondary | ICD-10-CM

## 2024-07-12 ENCOUNTER — Encounter (HOSPITAL_BASED_OUTPATIENT_CLINIC_OR_DEPARTMENT_OTHER): Payer: Self-pay | Attending: Internal Medicine | Admitting: Internal Medicine

## 2024-07-12 DIAGNOSIS — I87311 Chronic venous hypertension (idiopathic) with ulcer of right lower extremity: Secondary | ICD-10-CM | POA: Diagnosis present

## 2024-07-12 DIAGNOSIS — L97812 Non-pressure chronic ulcer of other part of right lower leg with fat layer exposed: Secondary | ICD-10-CM | POA: Insufficient documentation

## 2024-07-21 ENCOUNTER — Ambulatory Visit (HOSPITAL_BASED_OUTPATIENT_CLINIC_OR_DEPARTMENT_OTHER): Payer: Self-pay | Admitting: General Surgery

## 2024-07-26 ENCOUNTER — Encounter (HOSPITAL_BASED_OUTPATIENT_CLINIC_OR_DEPARTMENT_OTHER): Admitting: General Surgery

## 2024-07-26 DIAGNOSIS — I87311 Chronic venous hypertension (idiopathic) with ulcer of right lower extremity: Secondary | ICD-10-CM | POA: Diagnosis not present

## 2024-08-09 ENCOUNTER — Encounter (HOSPITAL_BASED_OUTPATIENT_CLINIC_OR_DEPARTMENT_OTHER): Attending: General Surgery | Admitting: General Surgery

## 2024-08-09 DIAGNOSIS — I87311 Chronic venous hypertension (idiopathic) with ulcer of right lower extremity: Secondary | ICD-10-CM | POA: Insufficient documentation

## 2024-08-09 DIAGNOSIS — L97812 Non-pressure chronic ulcer of other part of right lower leg with fat layer exposed: Secondary | ICD-10-CM | POA: Diagnosis not present

## 2024-08-09 DIAGNOSIS — L97512 Non-pressure chronic ulcer of other part of right foot with fat layer exposed: Secondary | ICD-10-CM | POA: Diagnosis not present

## 2024-08-18 ENCOUNTER — Encounter (HOSPITAL_BASED_OUTPATIENT_CLINIC_OR_DEPARTMENT_OTHER): Admitting: General Surgery

## 2024-08-18 DIAGNOSIS — I87311 Chronic venous hypertension (idiopathic) with ulcer of right lower extremity: Secondary | ICD-10-CM | POA: Diagnosis not present

## 2024-09-02 ENCOUNTER — Encounter (HOSPITAL_BASED_OUTPATIENT_CLINIC_OR_DEPARTMENT_OTHER): Admitting: General Surgery

## 2024-09-02 DIAGNOSIS — I87311 Chronic venous hypertension (idiopathic) with ulcer of right lower extremity: Secondary | ICD-10-CM | POA: Diagnosis not present

## 2024-09-16 ENCOUNTER — Encounter (HOSPITAL_BASED_OUTPATIENT_CLINIC_OR_DEPARTMENT_OTHER): Attending: General Surgery | Admitting: General Surgery

## 2024-09-16 DIAGNOSIS — I87311 Chronic venous hypertension (idiopathic) with ulcer of right lower extremity: Secondary | ICD-10-CM | POA: Insufficient documentation

## 2024-09-27 ENCOUNTER — Encounter (HOSPITAL_BASED_OUTPATIENT_CLINIC_OR_DEPARTMENT_OTHER): Admitting: General Surgery
